# Patient Record
Sex: Female | Born: 1945 | Race: Black or African American | Hispanic: No | Smoking: Never smoker
Health system: Southern US, Community
[De-identification: ages and names within clinical notes are randomized; demographics above are authoritative.]

## PROBLEM LIST (undated history)

## (undated) DIAGNOSIS — R011 Cardiac murmur, unspecified: Secondary | ICD-10-CM

## (undated) DIAGNOSIS — N189 Chronic kidney disease, unspecified: Secondary | ICD-10-CM

## (undated) DIAGNOSIS — R87619 Unspecified abnormal cytological findings in specimens from cervix uteri: Secondary | ICD-10-CM

## (undated) DIAGNOSIS — B019 Varicella without complication: Secondary | ICD-10-CM

## (undated) DIAGNOSIS — U071 COVID-19: Secondary | ICD-10-CM

## (undated) DIAGNOSIS — T7840XA Allergy, unspecified, initial encounter: Secondary | ICD-10-CM

## (undated) DIAGNOSIS — E079 Disorder of thyroid, unspecified: Secondary | ICD-10-CM

## (undated) DIAGNOSIS — D649 Anemia, unspecified: Secondary | ICD-10-CM

## (undated) DIAGNOSIS — B059 Measles without complication: Secondary | ICD-10-CM

## (undated) DIAGNOSIS — N2581 Secondary hyperparathyroidism of renal origin: Secondary | ICD-10-CM

## (undated) DIAGNOSIS — G4730 Sleep apnea, unspecified: Secondary | ICD-10-CM

## (undated) DIAGNOSIS — J439 Emphysema, unspecified: Secondary | ICD-10-CM

## (undated) DIAGNOSIS — E785 Hyperlipidemia, unspecified: Secondary | ICD-10-CM

## (undated) DIAGNOSIS — L603 Nail dystrophy: Secondary | ICD-10-CM

## (undated) DIAGNOSIS — I10 Essential (primary) hypertension: Secondary | ICD-10-CM

## (undated) DIAGNOSIS — N6019 Diffuse cystic mastopathy of unspecified breast: Secondary | ICD-10-CM

## (undated) DIAGNOSIS — H409 Unspecified glaucoma: Secondary | ICD-10-CM

## (undated) DIAGNOSIS — M25519 Pain in unspecified shoulder: Secondary | ICD-10-CM

## (undated) DIAGNOSIS — F0390 Unspecified dementia without behavioral disturbance: Secondary | ICD-10-CM

## (undated) HISTORY — DX: Pain in unspecified shoulder: M25.519

## (undated) HISTORY — DX: Secondary hyperparathyroidism of renal origin: N25.81

## (undated) HISTORY — DX: Measles without complication: B05.9

## (undated) HISTORY — DX: Cardiac murmur, unspecified: R01.1

## (undated) HISTORY — DX: Chronic kidney disease, unspecified: N18.9

## (undated) HISTORY — DX: Nail dystrophy: L60.3

## (undated) HISTORY — PX: UTERINE FIBROID SURGERY: SHX826

## (undated) HISTORY — DX: Unspecified abnormal cytological findings in specimens from cervix uteri: R87.619

## (undated) HISTORY — DX: Disorder of thyroid, unspecified: E07.9

## (undated) HISTORY — DX: Diffuse cystic mastopathy of unspecified breast: N60.19

## (undated) HISTORY — DX: Unspecified dementia, unspecified severity, without behavioral disturbance, psychotic disturbance, mood disturbance, and anxiety: F03.90

## (undated) HISTORY — DX: Sleep apnea, unspecified: G47.30

## (undated) HISTORY — DX: Varicella without complication: B01.9

## (undated) HISTORY — PX: ABDOMINAL HYSTERECTOMY: SHX81

## (undated) HISTORY — DX: Anemia, unspecified: D64.9

## (undated) HISTORY — DX: Emphysema, unspecified: J43.9

## (undated) HISTORY — DX: COVID-19: U07.1

## (undated) HISTORY — DX: Allergy, unspecified, initial encounter: T78.40XA

## (undated) HISTORY — PX: VEIN LIGATION AND STRIPPING: SHX2653

## (undated) HISTORY — DX: Unspecified glaucoma: H40.9

## (undated) HISTORY — DX: Essential (primary) hypertension: I10

## (undated) HISTORY — DX: Hyperlipidemia, unspecified: E78.5

## (undated) HISTORY — PX: VARICOSE VEIN SURGERY: SHX832

---

## 1997-12-08 ENCOUNTER — Encounter: Admission: RE | Admit: 1997-12-08 | Discharge: 1997-12-08 | Payer: Self-pay | Admitting: Internal Medicine

## 1998-01-16 ENCOUNTER — Encounter: Admission: RE | Admit: 1998-01-16 | Discharge: 1998-01-16 | Payer: Self-pay | Admitting: Internal Medicine

## 1998-01-30 ENCOUNTER — Encounter: Admission: RE | Admit: 1998-01-30 | Discharge: 1998-04-30 | Payer: Self-pay | Admitting: *Deleted

## 1998-07-19 ENCOUNTER — Other Ambulatory Visit: Admission: RE | Admit: 1998-07-19 | Discharge: 1998-07-19 | Payer: Self-pay | Admitting: Obstetrics & Gynecology

## 1998-07-23 ENCOUNTER — Emergency Department (HOSPITAL_COMMUNITY): Admission: EM | Admit: 1998-07-23 | Discharge: 1998-07-23 | Payer: Self-pay | Admitting: Emergency Medicine

## 2011-06-24 DIAGNOSIS — E785 Hyperlipidemia, unspecified: Secondary | ICD-10-CM | POA: Diagnosis not present

## 2011-06-24 DIAGNOSIS — I498 Other specified cardiac arrhythmias: Secondary | ICD-10-CM | POA: Diagnosis not present

## 2011-06-24 DIAGNOSIS — E039 Hypothyroidism, unspecified: Secondary | ICD-10-CM | POA: Diagnosis not present

## 2011-06-24 DIAGNOSIS — I10 Essential (primary) hypertension: Secondary | ICD-10-CM | POA: Diagnosis not present

## 2011-06-24 DIAGNOSIS — E041 Nontoxic single thyroid nodule: Secondary | ICD-10-CM | POA: Diagnosis not present

## 2011-08-19 DIAGNOSIS — N19 Unspecified kidney failure: Secondary | ICD-10-CM | POA: Diagnosis not present

## 2011-10-13 DIAGNOSIS — I998 Other disorder of circulatory system: Secondary | ICD-10-CM | POA: Diagnosis not present

## 2011-10-13 DIAGNOSIS — I743 Embolism and thrombosis of arteries of the lower extremities: Secondary | ICD-10-CM | POA: Diagnosis not present

## 2011-10-13 DIAGNOSIS — I75029 Atheroembolism of unspecified lower extremity: Secondary | ICD-10-CM | POA: Diagnosis not present

## 2011-12-11 DIAGNOSIS — Q618 Other cystic kidney diseases: Secondary | ICD-10-CM | POA: Diagnosis not present

## 2011-12-11 DIAGNOSIS — E039 Hypothyroidism, unspecified: Secondary | ICD-10-CM | POA: Diagnosis not present

## 2011-12-11 DIAGNOSIS — I120 Hypertensive chronic kidney disease with stage 5 chronic kidney disease or end stage renal disease: Secondary | ICD-10-CM | POA: Diagnosis not present

## 2011-12-11 DIAGNOSIS — R7309 Other abnormal glucose: Secondary | ICD-10-CM | POA: Diagnosis not present

## 2011-12-12 DIAGNOSIS — R5381 Other malaise: Secondary | ICD-10-CM | POA: Diagnosis not present

## 2011-12-12 DIAGNOSIS — Q612 Polycystic kidney, adult type: Secondary | ICD-10-CM | POA: Diagnosis not present

## 2011-12-12 DIAGNOSIS — R5383 Other fatigue: Secondary | ICD-10-CM | POA: Diagnosis not present

## 2011-12-12 DIAGNOSIS — I4589 Other specified conduction disorders: Secondary | ICD-10-CM | POA: Diagnosis not present

## 2011-12-12 DIAGNOSIS — I10 Essential (primary) hypertension: Secondary | ICD-10-CM | POA: Diagnosis not present

## 2012-01-13 DIAGNOSIS — H4011X0 Primary open-angle glaucoma, stage unspecified: Secondary | ICD-10-CM | POA: Diagnosis not present

## 2012-01-13 DIAGNOSIS — H2510 Age-related nuclear cataract, unspecified eye: Secondary | ICD-10-CM | POA: Diagnosis not present

## 2012-01-14 DIAGNOSIS — K3189 Other diseases of stomach and duodenum: Secondary | ICD-10-CM | POA: Diagnosis not present

## 2012-01-14 DIAGNOSIS — R1013 Epigastric pain: Secondary | ICD-10-CM | POA: Diagnosis not present

## 2012-02-12 DIAGNOSIS — E669 Obesity, unspecified: Secondary | ICD-10-CM | POA: Diagnosis not present

## 2012-02-12 DIAGNOSIS — E785 Hyperlipidemia, unspecified: Secondary | ICD-10-CM | POA: Diagnosis not present

## 2012-02-12 DIAGNOSIS — I10 Essential (primary) hypertension: Secondary | ICD-10-CM | POA: Diagnosis not present

## 2012-02-25 DIAGNOSIS — Z86010 Personal history of colonic polyps: Secondary | ICD-10-CM | POA: Diagnosis not present

## 2012-05-18 DIAGNOSIS — D649 Anemia, unspecified: Secondary | ICD-10-CM | POA: Diagnosis not present

## 2012-05-18 DIAGNOSIS — D510 Vitamin B12 deficiency anemia due to intrinsic factor deficiency: Secondary | ICD-10-CM | POA: Diagnosis not present

## 2012-05-18 DIAGNOSIS — E785 Hyperlipidemia, unspecified: Secondary | ICD-10-CM | POA: Diagnosis not present

## 2012-05-18 DIAGNOSIS — H409 Unspecified glaucoma: Secondary | ICD-10-CM | POA: Diagnosis not present

## 2012-05-18 DIAGNOSIS — I10 Essential (primary) hypertension: Secondary | ICD-10-CM | POA: Diagnosis not present

## 2012-05-18 DIAGNOSIS — E039 Hypothyroidism, unspecified: Secondary | ICD-10-CM | POA: Diagnosis not present

## 2012-05-31 DIAGNOSIS — Z1211 Encounter for screening for malignant neoplasm of colon: Secondary | ICD-10-CM | POA: Diagnosis not present

## 2012-05-31 DIAGNOSIS — E039 Hypothyroidism, unspecified: Secondary | ICD-10-CM | POA: Diagnosis not present

## 2012-05-31 DIAGNOSIS — E785 Hyperlipidemia, unspecified: Secondary | ICD-10-CM | POA: Diagnosis not present

## 2012-05-31 DIAGNOSIS — I10 Essential (primary) hypertension: Secondary | ICD-10-CM | POA: Diagnosis not present

## 2012-06-01 DIAGNOSIS — I10 Essential (primary) hypertension: Secondary | ICD-10-CM | POA: Diagnosis not present

## 2012-07-06 DIAGNOSIS — E785 Hyperlipidemia, unspecified: Secondary | ICD-10-CM | POA: Diagnosis not present

## 2012-07-06 DIAGNOSIS — I10 Essential (primary) hypertension: Secondary | ICD-10-CM | POA: Diagnosis not present

## 2012-07-06 DIAGNOSIS — E039 Hypothyroidism, unspecified: Secondary | ICD-10-CM | POA: Diagnosis not present

## 2012-07-06 DIAGNOSIS — R609 Edema, unspecified: Secondary | ICD-10-CM | POA: Diagnosis not present

## 2012-08-20 DIAGNOSIS — E785 Hyperlipidemia, unspecified: Secondary | ICD-10-CM | POA: Diagnosis not present

## 2012-08-20 DIAGNOSIS — I10 Essential (primary) hypertension: Secondary | ICD-10-CM | POA: Diagnosis not present

## 2012-08-20 DIAGNOSIS — R3989 Other symptoms and signs involving the genitourinary system: Secondary | ICD-10-CM | POA: Diagnosis not present

## 2012-08-20 DIAGNOSIS — D239 Other benign neoplasm of skin, unspecified: Secondary | ICD-10-CM | POA: Diagnosis not present

## 2012-10-05 DIAGNOSIS — L821 Other seborrheic keratosis: Secondary | ICD-10-CM | POA: Diagnosis not present

## 2012-11-02 DIAGNOSIS — K5900 Constipation, unspecified: Secondary | ICD-10-CM | POA: Diagnosis not present

## 2012-11-02 DIAGNOSIS — I10 Essential (primary) hypertension: Secondary | ICD-10-CM | POA: Diagnosis not present

## 2012-11-02 DIAGNOSIS — E785 Hyperlipidemia, unspecified: Secondary | ICD-10-CM | POA: Diagnosis not present

## 2012-11-02 DIAGNOSIS — E039 Hypothyroidism, unspecified: Secondary | ICD-10-CM | POA: Diagnosis not present

## 2012-11-24 DIAGNOSIS — N281 Cyst of kidney, acquired: Secondary | ICD-10-CM | POA: Diagnosis not present

## 2012-11-24 DIAGNOSIS — N181 Chronic kidney disease, stage 1: Secondary | ICD-10-CM | POA: Diagnosis not present

## 2012-12-06 DIAGNOSIS — H35039 Hypertensive retinopathy, unspecified eye: Secondary | ICD-10-CM | POA: Diagnosis not present

## 2012-12-06 DIAGNOSIS — H4011X0 Primary open-angle glaucoma, stage unspecified: Secondary | ICD-10-CM | POA: Diagnosis not present

## 2012-12-22 DIAGNOSIS — Q618 Other cystic kidney diseases: Secondary | ICD-10-CM | POA: Diagnosis not present

## 2012-12-22 DIAGNOSIS — N281 Cyst of kidney, acquired: Secondary | ICD-10-CM | POA: Diagnosis not present

## 2012-12-22 DIAGNOSIS — N289 Disorder of kidney and ureter, unspecified: Secondary | ICD-10-CM | POA: Diagnosis not present

## 2012-12-22 DIAGNOSIS — M549 Dorsalgia, unspecified: Secondary | ICD-10-CM | POA: Diagnosis not present

## 2012-12-22 DIAGNOSIS — N189 Chronic kidney disease, unspecified: Secondary | ICD-10-CM | POA: Diagnosis not present

## 2012-12-22 DIAGNOSIS — R109 Unspecified abdominal pain: Secondary | ICD-10-CM | POA: Diagnosis not present

## 2012-12-31 DIAGNOSIS — I129 Hypertensive chronic kidney disease with stage 1 through stage 4 chronic kidney disease, or unspecified chronic kidney disease: Secondary | ICD-10-CM | POA: Diagnosis not present

## 2012-12-31 DIAGNOSIS — D128 Benign neoplasm of rectum: Secondary | ICD-10-CM | POA: Diagnosis not present

## 2012-12-31 DIAGNOSIS — Z86010 Personal history of colonic polyps: Secondary | ICD-10-CM | POA: Diagnosis not present

## 2012-12-31 DIAGNOSIS — K5730 Diverticulosis of large intestine without perforation or abscess without bleeding: Secondary | ICD-10-CM | POA: Diagnosis not present

## 2012-12-31 DIAGNOSIS — N189 Chronic kidney disease, unspecified: Secondary | ICD-10-CM | POA: Diagnosis not present

## 2012-12-31 DIAGNOSIS — Z79899 Other long term (current) drug therapy: Secondary | ICD-10-CM | POA: Diagnosis not present

## 2012-12-31 DIAGNOSIS — E039 Hypothyroidism, unspecified: Secondary | ICD-10-CM | POA: Diagnosis not present

## 2012-12-31 DIAGNOSIS — K621 Rectal polyp: Secondary | ICD-10-CM | POA: Diagnosis not present

## 2012-12-31 DIAGNOSIS — K620 Anal polyp: Secondary | ICD-10-CM | POA: Diagnosis not present

## 2012-12-31 DIAGNOSIS — Z1211 Encounter for screening for malignant neoplasm of colon: Secondary | ICD-10-CM | POA: Diagnosis not present

## 2012-12-31 DIAGNOSIS — D126 Benign neoplasm of colon, unspecified: Secondary | ICD-10-CM | POA: Diagnosis not present

## 2012-12-31 HISTORY — PX: KIDNEY CYST REMOVAL: SHX684

## 2013-01-03 DIAGNOSIS — Z531 Procedure and treatment not carried out because of patient's decision for reasons of belief and group pressure: Secondary | ICD-10-CM | POA: Diagnosis not present

## 2013-01-03 DIAGNOSIS — N281 Cyst of kidney, acquired: Secondary | ICD-10-CM | POA: Diagnosis not present

## 2013-01-03 DIAGNOSIS — N189 Chronic kidney disease, unspecified: Secondary | ICD-10-CM | POA: Diagnosis not present

## 2013-01-03 DIAGNOSIS — N289 Disorder of kidney and ureter, unspecified: Secondary | ICD-10-CM | POA: Diagnosis not present

## 2013-01-05 DIAGNOSIS — H2510 Age-related nuclear cataract, unspecified eye: Secondary | ICD-10-CM | POA: Diagnosis not present

## 2013-01-05 DIAGNOSIS — H4011X0 Primary open-angle glaucoma, stage unspecified: Secondary | ICD-10-CM | POA: Diagnosis not present

## 2013-01-30 DIAGNOSIS — H1130 Conjunctival hemorrhage, unspecified eye: Secondary | ICD-10-CM | POA: Diagnosis not present

## 2013-02-02 DIAGNOSIS — H1130 Conjunctival hemorrhage, unspecified eye: Secondary | ICD-10-CM | POA: Diagnosis not present

## 2013-02-07 DIAGNOSIS — N281 Cyst of kidney, acquired: Secondary | ICD-10-CM | POA: Diagnosis not present

## 2013-02-07 DIAGNOSIS — N289 Disorder of kidney and ureter, unspecified: Secondary | ICD-10-CM | POA: Diagnosis not present

## 2013-02-07 DIAGNOSIS — I129 Hypertensive chronic kidney disease with stage 1 through stage 4 chronic kidney disease, or unspecified chronic kidney disease: Secondary | ICD-10-CM | POA: Diagnosis not present

## 2013-02-07 DIAGNOSIS — N189 Chronic kidney disease, unspecified: Secondary | ICD-10-CM | POA: Diagnosis not present

## 2013-02-07 DIAGNOSIS — Z01810 Encounter for preprocedural cardiovascular examination: Secondary | ICD-10-CM | POA: Diagnosis not present

## 2013-02-15 DIAGNOSIS — N281 Cyst of kidney, acquired: Secondary | ICD-10-CM | POA: Diagnosis present

## 2013-02-15 DIAGNOSIS — I517 Cardiomegaly: Secondary | ICD-10-CM | POA: Diagnosis not present

## 2013-02-15 DIAGNOSIS — G8918 Other acute postprocedural pain: Secondary | ICD-10-CM | POA: Diagnosis not present

## 2013-02-15 DIAGNOSIS — K5900 Constipation, unspecified: Secondary | ICD-10-CM | POA: Diagnosis present

## 2013-02-15 DIAGNOSIS — D3000 Benign neoplasm of unspecified kidney: Secondary | ICD-10-CM | POA: Diagnosis not present

## 2013-02-15 DIAGNOSIS — E039 Hypothyroidism, unspecified: Secondary | ICD-10-CM | POA: Diagnosis present

## 2013-02-15 DIAGNOSIS — I129 Hypertensive chronic kidney disease with stage 1 through stage 4 chronic kidney disease, or unspecified chronic kidney disease: Secondary | ICD-10-CM | POA: Diagnosis not present

## 2013-02-15 DIAGNOSIS — N189 Chronic kidney disease, unspecified: Secondary | ICD-10-CM | POA: Diagnosis not present

## 2013-02-15 DIAGNOSIS — R109 Unspecified abdominal pain: Secondary | ICD-10-CM | POA: Diagnosis not present

## 2013-02-15 DIAGNOSIS — N289 Disorder of kidney and ureter, unspecified: Secondary | ICD-10-CM | POA: Diagnosis not present

## 2013-02-15 DIAGNOSIS — E785 Hyperlipidemia, unspecified: Secondary | ICD-10-CM | POA: Diagnosis present

## 2013-02-15 DIAGNOSIS — Q618 Other cystic kidney diseases: Secondary | ICD-10-CM | POA: Diagnosis not present

## 2013-02-15 DIAGNOSIS — J90 Pleural effusion, not elsewhere classified: Secondary | ICD-10-CM | POA: Diagnosis not present

## 2013-02-15 DIAGNOSIS — R918 Other nonspecific abnormal finding of lung field: Secondary | ICD-10-CM | POA: Diagnosis not present

## 2013-02-15 DIAGNOSIS — D573 Sickle-cell trait: Secondary | ICD-10-CM | POA: Diagnosis present

## 2013-02-23 DIAGNOSIS — Z5189 Encounter for other specified aftercare: Secondary | ICD-10-CM | POA: Diagnosis not present

## 2013-03-08 DIAGNOSIS — C649 Malignant neoplasm of unspecified kidney, except renal pelvis: Secondary | ICD-10-CM | POA: Diagnosis not present

## 2013-06-12 DIAGNOSIS — I10 Essential (primary) hypertension: Secondary | ICD-10-CM | POA: Diagnosis not present

## 2013-07-14 DIAGNOSIS — N189 Chronic kidney disease, unspecified: Secondary | ICD-10-CM | POA: Diagnosis not present

## 2013-07-14 DIAGNOSIS — I10 Essential (primary) hypertension: Secondary | ICD-10-CM | POA: Diagnosis not present

## 2013-07-14 DIAGNOSIS — E039 Hypothyroidism, unspecified: Secondary | ICD-10-CM | POA: Diagnosis not present

## 2013-07-22 DIAGNOSIS — N179 Acute kidney failure, unspecified: Secondary | ICD-10-CM | POA: Diagnosis not present

## 2013-07-22 DIAGNOSIS — E039 Hypothyroidism, unspecified: Secondary | ICD-10-CM | POA: Diagnosis not present

## 2013-07-22 DIAGNOSIS — I10 Essential (primary) hypertension: Secondary | ICD-10-CM | POA: Diagnosis not present

## 2013-07-22 DIAGNOSIS — Z79899 Other long term (current) drug therapy: Secondary | ICD-10-CM | POA: Diagnosis not present

## 2013-07-25 DIAGNOSIS — N184 Chronic kidney disease, stage 4 (severe): Secondary | ICD-10-CM | POA: Diagnosis not present

## 2013-07-25 DIAGNOSIS — N2581 Secondary hyperparathyroidism of renal origin: Secondary | ICD-10-CM | POA: Diagnosis not present

## 2013-07-25 DIAGNOSIS — N170 Acute kidney failure with tubular necrosis: Secondary | ICD-10-CM | POA: Diagnosis not present

## 2013-07-25 DIAGNOSIS — I10 Essential (primary) hypertension: Secondary | ICD-10-CM | POA: Diagnosis not present

## 2013-08-09 DIAGNOSIS — H4011X0 Primary open-angle glaucoma, stage unspecified: Secondary | ICD-10-CM | POA: Diagnosis not present

## 2013-08-18 DIAGNOSIS — N184 Chronic kidney disease, stage 4 (severe): Secondary | ICD-10-CM | POA: Diagnosis not present

## 2013-08-23 DIAGNOSIS — N039 Chronic nephritic syndrome with unspecified morphologic changes: Secondary | ICD-10-CM | POA: Diagnosis not present

## 2013-08-23 DIAGNOSIS — N184 Chronic kidney disease, stage 4 (severe): Secondary | ICD-10-CM | POA: Diagnosis not present

## 2013-08-23 DIAGNOSIS — N2581 Secondary hyperparathyroidism of renal origin: Secondary | ICD-10-CM | POA: Diagnosis not present

## 2013-08-23 DIAGNOSIS — I10 Essential (primary) hypertension: Secondary | ICD-10-CM | POA: Diagnosis not present

## 2013-08-23 DIAGNOSIS — D631 Anemia in chronic kidney disease: Secondary | ICD-10-CM | POA: Diagnosis not present

## 2013-08-24 DIAGNOSIS — C649 Malignant neoplasm of unspecified kidney, except renal pelvis: Secondary | ICD-10-CM | POA: Diagnosis not present

## 2013-09-06 DIAGNOSIS — N269 Renal sclerosis, unspecified: Secondary | ICD-10-CM | POA: Diagnosis not present

## 2013-09-06 DIAGNOSIS — R935 Abnormal findings on diagnostic imaging of other abdominal regions, including retroperitoneum: Secondary | ICD-10-CM | POA: Diagnosis not present

## 2013-09-06 DIAGNOSIS — C649 Malignant neoplasm of unspecified kidney, except renal pelvis: Secondary | ICD-10-CM | POA: Diagnosis not present

## 2013-09-06 DIAGNOSIS — D3000 Benign neoplasm of unspecified kidney: Secondary | ICD-10-CM | POA: Diagnosis not present

## 2013-09-06 DIAGNOSIS — Z905 Acquired absence of kidney: Secondary | ICD-10-CM | POA: Diagnosis not present

## 2013-09-06 DIAGNOSIS — R944 Abnormal results of kidney function studies: Secondary | ICD-10-CM | POA: Diagnosis not present

## 2013-09-06 DIAGNOSIS — N289 Disorder of kidney and ureter, unspecified: Secondary | ICD-10-CM | POA: Diagnosis not present

## 2013-09-13 DIAGNOSIS — N2581 Secondary hyperparathyroidism of renal origin: Secondary | ICD-10-CM | POA: Diagnosis not present

## 2013-09-13 DIAGNOSIS — R809 Proteinuria, unspecified: Secondary | ICD-10-CM | POA: Diagnosis not present

## 2013-09-13 DIAGNOSIS — I10 Essential (primary) hypertension: Secondary | ICD-10-CM | POA: Diagnosis not present

## 2013-09-13 DIAGNOSIS — D631 Anemia in chronic kidney disease: Secondary | ICD-10-CM | POA: Diagnosis not present

## 2013-09-13 DIAGNOSIS — N184 Chronic kidney disease, stage 4 (severe): Secondary | ICD-10-CM | POA: Diagnosis not present

## 2013-10-05 DIAGNOSIS — E559 Vitamin D deficiency, unspecified: Secondary | ICD-10-CM | POA: Diagnosis not present

## 2013-10-05 DIAGNOSIS — Q613 Polycystic kidney, unspecified: Secondary | ICD-10-CM | POA: Diagnosis not present

## 2013-10-05 DIAGNOSIS — E039 Hypothyroidism, unspecified: Secondary | ICD-10-CM | POA: Diagnosis not present

## 2013-10-05 DIAGNOSIS — R7309 Other abnormal glucose: Secondary | ICD-10-CM | POA: Diagnosis not present

## 2013-10-05 DIAGNOSIS — I10 Essential (primary) hypertension: Secondary | ICD-10-CM | POA: Diagnosis not present

## 2013-10-19 DIAGNOSIS — I129 Hypertensive chronic kidney disease with stage 1 through stage 4 chronic kidney disease, or unspecified chronic kidney disease: Secondary | ICD-10-CM | POA: Diagnosis not present

## 2013-10-19 DIAGNOSIS — Z23 Encounter for immunization: Secondary | ICD-10-CM | POA: Diagnosis not present

## 2013-10-19 DIAGNOSIS — Z79899 Other long term (current) drug therapy: Secondary | ICD-10-CM | POA: Diagnosis not present

## 2013-11-10 DIAGNOSIS — H4011X0 Primary open-angle glaucoma, stage unspecified: Secondary | ICD-10-CM | POA: Diagnosis not present

## 2013-11-14 DIAGNOSIS — Z1231 Encounter for screening mammogram for malignant neoplasm of breast: Secondary | ICD-10-CM | POA: Diagnosis not present

## 2013-11-25 DIAGNOSIS — I129 Hypertensive chronic kidney disease with stage 1 through stage 4 chronic kidney disease, or unspecified chronic kidney disease: Secondary | ICD-10-CM | POA: Diagnosis not present

## 2013-11-25 DIAGNOSIS — E785 Hyperlipidemia, unspecified: Secondary | ICD-10-CM | POA: Diagnosis not present

## 2013-11-25 DIAGNOSIS — N184 Chronic kidney disease, stage 4 (severe): Secondary | ICD-10-CM | POA: Diagnosis not present

## 2013-11-25 DIAGNOSIS — Q619 Cystic kidney disease, unspecified: Secondary | ICD-10-CM | POA: Diagnosis not present

## 2013-11-25 DIAGNOSIS — I10 Essential (primary) hypertension: Secondary | ICD-10-CM | POA: Diagnosis not present

## 2014-02-17 DIAGNOSIS — R109 Unspecified abdominal pain: Secondary | ICD-10-CM | POA: Diagnosis not present

## 2014-02-24 DIAGNOSIS — N390 Urinary tract infection, site not specified: Secondary | ICD-10-CM | POA: Diagnosis not present

## 2014-03-26 ENCOUNTER — Emergency Department: Payer: Self-pay | Admitting: Emergency Medicine

## 2014-03-26 DIAGNOSIS — N189 Chronic kidney disease, unspecified: Secondary | ICD-10-CM | POA: Diagnosis not present

## 2014-03-26 DIAGNOSIS — M791 Myalgia: Secondary | ICD-10-CM | POA: Diagnosis not present

## 2014-03-26 DIAGNOSIS — R202 Paresthesia of skin: Secondary | ICD-10-CM | POA: Diagnosis not present

## 2014-03-26 DIAGNOSIS — I639 Cerebral infarction, unspecified: Secondary | ICD-10-CM | POA: Diagnosis not present

## 2014-03-26 DIAGNOSIS — R252 Cramp and spasm: Secondary | ICD-10-CM | POA: Diagnosis not present

## 2014-03-26 DIAGNOSIS — I129 Hypertensive chronic kidney disease with stage 1 through stage 4 chronic kidney disease, or unspecified chronic kidney disease: Secondary | ICD-10-CM | POA: Diagnosis not present

## 2014-03-26 DIAGNOSIS — F419 Anxiety disorder, unspecified: Secondary | ICD-10-CM | POA: Diagnosis not present

## 2014-03-26 DIAGNOSIS — R209 Unspecified disturbances of skin sensation: Secondary | ICD-10-CM | POA: Diagnosis not present

## 2014-03-26 LAB — COMPREHENSIVE METABOLIC PANEL
ALBUMIN: 3.5 g/dL (ref 3.4–5.0)
ALT: 22 U/L
Alkaline Phosphatase: 72 U/L
Anion Gap: 9 (ref 7–16)
BUN: 50 mg/dL — AB (ref 7–18)
Bilirubin,Total: 0.2 mg/dL (ref 0.2–1.0)
CALCIUM: 8.4 mg/dL — AB (ref 8.5–10.1)
CHLORIDE: 112 mmol/L — AB (ref 98–107)
CREATININE: 3.3 mg/dL — AB (ref 0.60–1.30)
Co2: 23 mmol/L (ref 21–32)
EGFR (African American): 18 — ABNORMAL LOW
EGFR (Non-African Amer.): 15 — ABNORMAL LOW
GLUCOSE: 124 mg/dL — AB (ref 65–99)
Osmolality: 302 (ref 275–301)
Potassium: 3.8 mmol/L (ref 3.5–5.1)
SGOT(AST): 25 U/L (ref 15–37)
Sodium: 144 mmol/L (ref 136–145)
Total Protein: 7.9 g/dL (ref 6.4–8.2)

## 2014-03-26 LAB — CBC
HCT: 36.4 % (ref 35.0–47.0)
HGB: 11.8 g/dL — ABNORMAL LOW (ref 12.0–16.0)
MCH: 29 pg (ref 26.0–34.0)
MCHC: 32.5 g/dL (ref 32.0–36.0)
MCV: 89 fL (ref 80–100)
Platelet: 186 10*3/uL (ref 150–440)
RBC: 4.08 10*6/uL (ref 3.80–5.20)
RDW: 14.8 % — AB (ref 11.5–14.5)
WBC: 5.5 10*3/uL (ref 3.6–11.0)

## 2014-03-26 LAB — TROPONIN I: Troponin-I: <0.02 ng/mL

## 2014-03-28 DIAGNOSIS — R7309 Other abnormal glucose: Secondary | ICD-10-CM | POA: Diagnosis not present

## 2014-03-28 DIAGNOSIS — I10 Essential (primary) hypertension: Secondary | ICD-10-CM | POA: Diagnosis not present

## 2014-03-28 DIAGNOSIS — E784 Other hyperlipidemia: Secondary | ICD-10-CM | POA: Diagnosis not present

## 2014-03-28 DIAGNOSIS — E039 Hypothyroidism, unspecified: Secondary | ICD-10-CM | POA: Diagnosis not present

## 2014-03-28 DIAGNOSIS — Q613 Polycystic kidney, unspecified: Secondary | ICD-10-CM | POA: Diagnosis not present

## 2014-03-28 DIAGNOSIS — E559 Vitamin D deficiency, unspecified: Secondary | ICD-10-CM | POA: Diagnosis not present

## 2014-05-14 DIAGNOSIS — J069 Acute upper respiratory infection, unspecified: Secondary | ICD-10-CM | POA: Diagnosis not present

## 2014-05-14 DIAGNOSIS — J9801 Acute bronchospasm: Secondary | ICD-10-CM | POA: Diagnosis not present

## 2014-05-17 DIAGNOSIS — M79604 Pain in right leg: Secondary | ICD-10-CM | POA: Diagnosis not present

## 2014-05-17 DIAGNOSIS — M79605 Pain in left leg: Secondary | ICD-10-CM | POA: Diagnosis not present

## 2014-05-17 DIAGNOSIS — M79606 Pain in leg, unspecified: Secondary | ICD-10-CM | POA: Diagnosis not present

## 2014-06-20 DIAGNOSIS — E784 Other hyperlipidemia: Secondary | ICD-10-CM | POA: Diagnosis not present

## 2014-07-09 DIAGNOSIS — G4733 Obstructive sleep apnea (adult) (pediatric): Secondary | ICD-10-CM | POA: Diagnosis not present

## 2014-07-12 DIAGNOSIS — I10 Essential (primary) hypertension: Secondary | ICD-10-CM | POA: Diagnosis not present

## 2014-07-12 DIAGNOSIS — N184 Chronic kidney disease, stage 4 (severe): Secondary | ICD-10-CM | POA: Diagnosis not present

## 2014-07-14 DIAGNOSIS — N184 Chronic kidney disease, stage 4 (severe): Secondary | ICD-10-CM | POA: Diagnosis not present

## 2014-07-24 DIAGNOSIS — H4011X2 Primary open-angle glaucoma, moderate stage: Secondary | ICD-10-CM | POA: Diagnosis not present

## 2014-07-24 DIAGNOSIS — H16223 Keratoconjunctivitis sicca, not specified as Sjogren's, bilateral: Secondary | ICD-10-CM | POA: Diagnosis not present

## 2014-10-09 DIAGNOSIS — N184 Chronic kidney disease, stage 4 (severe): Secondary | ICD-10-CM | POA: Diagnosis not present

## 2014-10-09 DIAGNOSIS — I10 Essential (primary) hypertension: Secondary | ICD-10-CM | POA: Diagnosis not present

## 2014-10-25 DIAGNOSIS — E038 Other specified hypothyroidism: Secondary | ICD-10-CM | POA: Diagnosis not present

## 2014-10-25 DIAGNOSIS — E784 Other hyperlipidemia: Secondary | ICD-10-CM | POA: Diagnosis not present

## 2014-10-25 DIAGNOSIS — I129 Hypertensive chronic kidney disease with stage 1 through stage 4 chronic kidney disease, or unspecified chronic kidney disease: Secondary | ICD-10-CM | POA: Diagnosis not present

## 2015-02-02 DIAGNOSIS — H16223 Keratoconjunctivitis sicca, not specified as Sjogren's, bilateral: Secondary | ICD-10-CM | POA: Diagnosis not present

## 2015-02-02 DIAGNOSIS — H1131 Conjunctival hemorrhage, right eye: Secondary | ICD-10-CM | POA: Diagnosis not present

## 2015-02-02 DIAGNOSIS — H4011X2 Primary open-angle glaucoma, moderate stage: Secondary | ICD-10-CM | POA: Diagnosis not present

## 2015-02-28 DIAGNOSIS — I10 Essential (primary) hypertension: Secondary | ICD-10-CM | POA: Diagnosis not present

## 2015-02-28 DIAGNOSIS — N184 Chronic kidney disease, stage 4 (severe): Secondary | ICD-10-CM | POA: Diagnosis not present

## 2015-03-12 DIAGNOSIS — H35039 Hypertensive retinopathy, unspecified eye: Secondary | ICD-10-CM | POA: Diagnosis not present

## 2015-03-12 DIAGNOSIS — H1131 Conjunctival hemorrhage, right eye: Secondary | ICD-10-CM | POA: Diagnosis not present

## 2015-03-14 ENCOUNTER — Ambulatory Visit (INDEPENDENT_AMBULATORY_CARE_PROVIDER_SITE_OTHER): Payer: Medicare Other | Admitting: Family Medicine

## 2015-03-14 ENCOUNTER — Encounter: Payer: Self-pay | Admitting: Family Medicine

## 2015-03-14 VITALS — BP 154/76 | HR 55 | Temp 97.8°F | Resp 16 | Ht 62.0 in | Wt 200.2 lb

## 2015-03-14 DIAGNOSIS — N183 Chronic kidney disease, stage 3 unspecified: Secondary | ICD-10-CM

## 2015-03-14 DIAGNOSIS — Q613 Polycystic kidney, unspecified: Secondary | ICD-10-CM | POA: Diagnosis not present

## 2015-03-14 DIAGNOSIS — R7301 Impaired fasting glucose: Secondary | ICD-10-CM | POA: Diagnosis not present

## 2015-03-14 DIAGNOSIS — E038 Other specified hypothyroidism: Secondary | ICD-10-CM

## 2015-03-14 DIAGNOSIS — J309 Allergic rhinitis, unspecified: Secondary | ICD-10-CM

## 2015-03-14 DIAGNOSIS — Z2821 Immunization not carried out because of patient refusal: Secondary | ICD-10-CM | POA: Diagnosis not present

## 2015-03-14 DIAGNOSIS — I10 Essential (primary) hypertension: Secondary | ICD-10-CM

## 2015-03-14 DIAGNOSIS — E785 Hyperlipidemia, unspecified: Secondary | ICD-10-CM | POA: Diagnosis not present

## 2015-03-14 DIAGNOSIS — J45909 Unspecified asthma, uncomplicated: Secondary | ICD-10-CM

## 2015-03-14 DIAGNOSIS — H409 Unspecified glaucoma: Secondary | ICD-10-CM | POA: Diagnosis not present

## 2015-03-14 DIAGNOSIS — I129 Hypertensive chronic kidney disease with stage 1 through stage 4 chronic kidney disease, or unspecified chronic kidney disease: Secondary | ICD-10-CM | POA: Diagnosis not present

## 2015-03-14 DIAGNOSIS — R7303 Prediabetes: Secondary | ICD-10-CM | POA: Insufficient documentation

## 2015-03-14 DIAGNOSIS — R0982 Postnasal drip: Secondary | ICD-10-CM

## 2015-03-14 DIAGNOSIS — E039 Hypothyroidism, unspecified: Secondary | ICD-10-CM | POA: Insufficient documentation

## 2015-03-14 DIAGNOSIS — I120 Hypertensive chronic kidney disease with stage 5 chronic kidney disease or end stage renal disease: Secondary | ICD-10-CM | POA: Insufficient documentation

## 2015-03-14 DIAGNOSIS — N185 Chronic kidney disease, stage 5: Secondary | ICD-10-CM

## 2015-03-14 DIAGNOSIS — R300 Dysuria: Secondary | ICD-10-CM | POA: Insufficient documentation

## 2015-03-14 LAB — POCT URINALYSIS DIPSTICK
Bilirubin, UA: NEGATIVE
Glucose, UA: NEGATIVE
Ketones, UA: NEGATIVE
Leukocytes, UA: NEGATIVE
NITRITE UA: NEGATIVE
PH UA: 5
PROTEIN UA: NEGATIVE
SPEC GRAV UA: 1.015
UROBILINOGEN UA: 0.2

## 2015-03-14 MED ORDER — HYDRALAZINE HCL 10 MG PO TABS: 10.0000 mg | ORAL_TABLET | Freq: Three times a day (TID) | ORAL | Status: DC

## 2015-03-14 MED ORDER — FLUTICASONE PROPIONATE 50 MCG/ACT NA SUSP: 2.0000 | Freq: Every day | NASAL | Status: DC

## 2015-03-14 NOTE — Progress Notes (Signed)
Name: Holly Mcgee   MRN: [id number]    DOB: 25-Dec-1945   Date:03/14/2015       Progress Note  Subjective  Chief Complaint  Chief Complaint  Patient presents with  . Establish Care  . Hypertension    Headaches, Edema-taking Lasix to help, takes BP at home-174/74  . Ear Pain    Bilateral ear pain, onset-2 weeks getting worst painful.  . Dysuria    1 month and intermittently  . Hypothyroidism    Used to see a Musician in [place].    HPI  Holly Mcgee is a 69 y.o. female here today to transition care of medical needs to a primary care provider. She complainso f bilateral ear pain for 2 weeks now. Not associated with fevers. Patient complains of dysuria and frequency She has had symptoms for several weeks. Patient also complains of congestion and rhinitis. Patient denies sorethroat, stomach ache and vaginal discharge. Patient does not have a history of recurrent UTI.  Patient does not have a history of pyelonephritis. Patient here for elevated blood pressure. She is not exercising and is not adherent to low salt diet.  Blood pressure is not well controlled at home. Cardiac symptoms lower extremity edema. Patient denies chest pain, chest pressure/discomfort, dyspnea, irregular heart beat and near-syncope.  Cardiovascular risk factors: advanced age (older than 40 for men, 70 for women), dyslipidemia, hypertension, obesity (BMI >= 30 kg/m2) and sedentary lifestyle. Use of agents associated with hypertension: NSAIDS and thyroid hormones. History of target organ damage: none. Related conditions include malignant HTN with CKD III, followed by Dr. Erling Cruz, Nephrologist in [place]. Patient reports more LE swelling and fatigue. Family risk for DM II.     Past Medical History  Diagnosis Date  . Hyperlipidemia   . Hypertension   . Allergy   . Thyroid disease     Patient Active Problem List   Diagnosis Date Noted  . Hypertension goal BP (blood pressure) < 140/90 03/14/2015   . Allergic rhinitis with postnasal drip 03/14/2015  . Dysuria 03/14/2015  . Dysuria 03/14/2015  . Glaucoma 03/14/2015    Social History  Substance Use Topics  . Smoking status: Never Smoker   . Smokeless tobacco: Never Used  . Alcohol Use: No     Current outpatient prescriptions:  .  atenolol (TENORMIN) 100 MG tablet, TK 1 T PO D, Disp: , Rfl: 6 .  Cholecalciferol (VITAMIN D-1000 MAX ST) 1000 UNITS tablet, Take by mouth., Disp: , Rfl:  .  cycloSPORINE (RESTASIS) 0.05 % ophthalmic emulsion, Place 1 drop into both eyes 2 (two) times daily., Disp: , Rfl:  .  dorzolamide-timolol (COSOPT) 22.3-6.8 MG/ML ophthalmic solution, Apply to eye., Disp: , Rfl:  .  furosemide (LASIX) 40 MG tablet, TK 1 T PO D, Disp: , Rfl: 6 .  TIROSINT 50 MCG CAPS, TK 1 C PO ONCE A DAY, Disp: , Rfl: 5 .  Travoprost, BAK Free, (TRAVATAN Z) 0.004 % SOLN ophthalmic solution, Apply to eye., Disp: , Rfl:    Past Surgical History  Procedure Laterality Date  . Kidney cyst removal  12/2012    Performed at [facility]  . Uterine fibroid surgery      In [place] several years ago  . Vein ligation and stripping Left     Performed in [place]  . Abdominal hysterectomy       Family History  Problem Relation Age of Onset  . Hyperlipidemia Mother   . Hypertension Mother   .  Diabetes Mother   . Hyperlipidemia Father   . Hypertension Father   . Cancer Father     colon  . Stroke Father     complications from HTN  . Mental illness Daughter   . Diabetes Son     hereditary     Allergies  Allergen Reactions  . Citrus Itching and Swelling     Review of Systems  CONSTITUTIONAL: No significant weight changes, fever, chills, weakness or fatigue.  HEENT:  - Eyes: No visual changes.  - Ears: No auditory changes. No pain.  - Nose: No sneezing, congestion, runny nose. - Throat: No sore throat. No changes in swallowing. SKIN: No rash or itching.  CARDIOVASCULAR: No chest pain, chest pressure or chest discomfort. No  palpitations or edema.  RESPIRATORY: No shortness of breath, cough or sputum.  GASTROINTESTINAL: No anorexia, nausea, vomiting. No changes in bowel habits. No abdominal pain or blood.  GENITOURINARY: Yes dysuria. No frequency. No discharge.  NEUROLOGICAL: No headache, dizziness, syncope, paralysis, ataxia, numbness or tingling in the extremities. No memory changes. No change in bowel or bladder control.  MUSCULOSKELETAL: No joint pain. No muscle pain. HEMATOLOGIC: No anemia, bleeding or bruising.  LYMPHATICS: No enlarged lymph nodes.  PSYCHIATRIC: No change in mood. No change in sleep pattern.  ENDOCRINOLOGIC: No reports of sweating, cold or heat intolerance. No polyuria or polydipsia.     Objective  BP 154/76 mmHg  Pulse 55  Temp(Src) 97.8 F (36.6 C) (Oral)  Resp 16  Ht 5\' 2"  (1.575 m)  Wt 200 lb 3.2 oz (90.81 kg)  BMI 36.61 kg/m2  SpO2 94% Body mass index is 36.61 kg/(m^2).  Physical Exam  Constitutional: Patient is obese and well-nourished. In no distress.  HEENT:  - Head: Normocephalic and atraumatic.  - Ears: Bilateral TMs gray, no erythema or effusion - Nose: Nasal mucosa moist, boggy - Mouth/Throat: Oropharynx is clear and moist. No tonsillar hypertrophy or erythema. Yes post nasal drainage.  - Eyes: Conjunctivae clear, EOM movements normal. PERRLA. No scleral icterus.  Neck: Normal range of motion. Neck supple. No JVD present. No thyromegaly present.  Cardiovascular: Normal rate, regular rhythm and normal heart sounds.  No murmur heard.  Pulmonary/Chest: Effort normal and breath sounds normal. No respiratory distress. Musculoskeletal: Normal range of motion bilateral UE and LE, no joint effusions. Peripheral vascular: Bilateral LE 1+ edema up to lower shins. Neurological: CN II-XII grossly intact with no focal deficits. Alert and oriented to person, place, and time. Coordination, balance, strength, speech and gait are normal.  Skin: Skin is warm and dry. No rash  noted. No erythema.  Psychiatric: Patient has a normal mood and affect. Behavior is normal in office today. Judgment and thought content normal in office today.   Recent Results (from the past 2160 hour(s))  POCT Urinalysis Dipstick     Status: None   Collection Time: 03/14/15  1:46 PM  Result Value Ref Range   Color, UA light yellow    Clarity, UA clear    Glucose, UA neg    Bilirubin, UA neg    Ketones, UA neg    Spec Grav, UA 1.015    Blood, UA Moderate    pH, UA 5.0    Protein, UA neg    Urobilinogen, UA 0.2    Nitrite, UA neg    Leukocytes, UA Negative Negative     Assessment & Plan  1. Hypertension goal BP (blood pressure) < 140/90 Sub optimal control. Was on ACEi at  some point but now with advancing CKD she can not use ACEi/ARB. Already on Atenolol 100 mg a day and Lasix 40 mg a day. Will add on hydralazine 10mg  po bid. Consider amlodipine use if needed but may worsen LE edema.  - CBC with Differential/Platelet - Comprehensive metabolic panel - hydrALAZINE (APRESOLINE) 10 MG tablet; Take 1 tablet (10 mg total) by mouth 3 (three) times daily.  Dispense: 90 tablet; Refill: 2  2. 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 May need dialysis at some point but she reports still making urine. Reviewed lab work from Nephrologist from 02/2015.   - CBC with Differential/Platelet - hydrALAZINE (APRESOLINE) 10 MG tablet; Take 1 tablet (10 mg total) by mouth 3 (three) times daily.  Dispense: 90 tablet; Refill: 2  3. Dysuria Udip shows hematuria without frank infection. Will culture urine.   - POCT Urinalysis Dipstick - Urine Culture - CBC with Differential/Platelet - Comprehensive metabolic panel  4. Allergic rhinitis with postnasal drip Start flonase.  - fluticasone (FLONASE) 50 MCG/ACT nasal spray; Place 2 sprays into both nostrils daily.  Dispense: 16 g; Refill: 3  5. Glaucoma Continue follow up with specialist.  6.  Polycystic kidney disease Continue follow up with specialist.  7. Adult onset hypothyroidism Will get blood work.  - TSH - T3, free - T4, free  8. Hyperlipidemia LDL goal <100 Blood work. She is not on a statin.   - Lipid panel  9. Elevated fasting glucose Rule out DM II diagnosis.  - Hemoglobin A1c  10. Influenza vaccination declined by patient

## 2015-03-15 DIAGNOSIS — E785 Hyperlipidemia, unspecified: Secondary | ICD-10-CM | POA: Diagnosis not present

## 2015-03-15 DIAGNOSIS — R7301 Impaired fasting glucose: Secondary | ICD-10-CM | POA: Diagnosis not present

## 2015-03-15 DIAGNOSIS — I129 Hypertensive chronic kidney disease with stage 1 through stage 4 chronic kidney disease, or unspecified chronic kidney disease: Secondary | ICD-10-CM | POA: Diagnosis not present

## 2015-03-15 DIAGNOSIS — E038 Other specified hypothyroidism: Secondary | ICD-10-CM | POA: Diagnosis not present

## 2015-03-15 DIAGNOSIS — I10 Essential (primary) hypertension: Secondary | ICD-10-CM | POA: Diagnosis not present

## 2015-03-15 DIAGNOSIS — R300 Dysuria: Secondary | ICD-10-CM | POA: Diagnosis not present

## 2015-03-15 DIAGNOSIS — N183 Chronic kidney disease, stage 3 (moderate): Secondary | ICD-10-CM | POA: Diagnosis not present

## 2015-03-15 LAB — PLEASE NOTE

## 2015-03-15 LAB — URINE CULTURE

## 2015-03-16 ENCOUNTER — Telehealth: Payer: Self-pay

## 2015-03-16 LAB — COMPREHENSIVE METABOLIC PANEL
A/G RATIO: 1.4 (ref 1.1–2.5)
ALK PHOS: 55 IU/L (ref 39–117)
ALT: 15 IU/L (ref 0–32)
AST: 19 IU/L (ref 0–40)
Albumin: 4.2 g/dL (ref 3.6–4.8)
BUN/Creatinine Ratio: 13 (ref 11–26)
BUN: 40 mg/dL — ABNORMAL HIGH (ref 8–27)
Bilirubin Total: 0.3 mg/dL (ref 0.0–1.2)
CO2: 23 mmol/L (ref 18–29)
Calcium: 9.2 mg/dL (ref 8.7–10.3)
Chloride: 102 mmol/L (ref 97–108)
Creatinine, Ser: 3.08 mg/dL — ABNORMAL HIGH (ref 0.57–1.00)
GFR calc Af Amer: 17 mL/min/{1.73_m2} — ABNORMAL LOW (ref >59)
GFR calc non Af Amer: 15 mL/min/{1.73_m2} — ABNORMAL LOW (ref >59)
GLOBULIN, TOTAL: 3 g/dL (ref 1.5–4.5)
Glucose: 99 mg/dL (ref 65–99)
POTASSIUM: 4.8 mmol/L (ref 3.5–5.2)
SODIUM: 140 mmol/L (ref 134–144)
Total Protein: 7.2 g/dL (ref 6.0–8.5)

## 2015-03-16 LAB — CBC WITH DIFFERENTIAL/PLATELET
BASOS: 0 %
Basophils Absolute: 0 10*3/uL (ref 0.0–0.2)
EOS (ABSOLUTE): 0 10*3/uL (ref 0.0–0.4)
EOS: 1 %
HEMATOCRIT: 36.8 % (ref 34.0–46.6)
Hemoglobin: 11.8 g/dL (ref 11.1–15.9)
Immature Grans (Abs): 0 10*3/uL (ref 0.0–0.1)
Immature Granulocytes: 0 %
LYMPHS ABS: 1.4 10*3/uL (ref 0.7–3.1)
Lymphs: 37 %
MCH: 29 pg (ref 26.6–33.0)
MCHC: 32.1 g/dL (ref 31.5–35.7)
MCV: 90 fL (ref 79–97)
MONOS ABS: 0.3 10*3/uL (ref 0.1–0.9)
Monocytes: 8 %
NEUTROS ABS: 2 10*3/uL (ref 1.4–7.0)
Neutrophils: 54 %
Platelets: 228 10*3/uL (ref 150–379)
RBC: 4.07 x10E6/uL (ref 3.77–5.28)
RDW: 14.4 % (ref 12.3–15.4)
WBC: 3.7 10*3/uL (ref 3.4–10.8)

## 2015-03-16 LAB — TSH: TSH: 0.566 u[IU]/mL (ref 0.450–4.500)

## 2015-03-16 LAB — T4, FREE: Free T4: 1.61 ng/dL (ref 0.82–1.77)

## 2015-03-16 LAB — HEMOGLOBIN A1C
Est. average glucose Bld gHb Est-mCnc: 126 mg/dL
Hgb A1c MFr Bld: 6 % — ABNORMAL HIGH (ref 4.8–5.6)

## 2015-03-16 LAB — LIPID PANEL
CHOL/HDL RATIO: 5.8 ratio — AB (ref 0.0–4.4)
Cholesterol, Total: 321 mg/dL — ABNORMAL HIGH (ref 100–199)
HDL: 55 mg/dL (ref >39)
LDL Calculated: 234 mg/dL — ABNORMAL HIGH (ref 0–99)
Triglycerides: 162 mg/dL — ABNORMAL HIGH (ref 0–149)
VLDL CHOLESTEROL CAL: 32 mg/dL (ref 5–40)

## 2015-03-16 LAB — T3, FREE: T3 FREE: 2.4 pg/mL (ref 2.0–4.4)

## 2015-03-16 NOTE — Telephone Encounter (Signed)
Contacted this patient to review her labs. After she verified her date of birth, labs were reviewed. Patient was then asked if she wanted to start a medication to help decrease her cholesterol and she declined. I encouraged her to start some lifestyle changes and then discuss this in more detail with Dr. Nadine Counts when she comes in on 04/16/15. She agreed. i then asked if she wanted a copy of her results mailed to her home address and she said yes.   Labs were printed and mailed to the address listed in her record.

## 2015-04-11 DIAGNOSIS — N184 Chronic kidney disease, stage 4 (severe): Secondary | ICD-10-CM | POA: Diagnosis not present

## 2015-04-11 DIAGNOSIS — E785 Hyperlipidemia, unspecified: Secondary | ICD-10-CM | POA: Diagnosis not present

## 2015-04-11 DIAGNOSIS — E039 Hypothyroidism, unspecified: Secondary | ICD-10-CM | POA: Diagnosis not present

## 2015-04-11 DIAGNOSIS — R7309 Other abnormal glucose: Secondary | ICD-10-CM | POA: Diagnosis not present

## 2015-04-11 DIAGNOSIS — I10 Essential (primary) hypertension: Secondary | ICD-10-CM | POA: Diagnosis not present

## 2015-04-11 DIAGNOSIS — I129 Hypertensive chronic kidney disease with stage 1 through stage 4 chronic kidney disease, or unspecified chronic kidney disease: Secondary | ICD-10-CM | POA: Diagnosis not present

## 2015-04-16 ENCOUNTER — Ambulatory Visit: Payer: Medicare Other | Admitting: Family Medicine

## 2015-05-15 DIAGNOSIS — I129 Hypertensive chronic kidney disease with stage 1 through stage 4 chronic kidney disease, or unspecified chronic kidney disease: Secondary | ICD-10-CM | POA: Diagnosis not present

## 2015-05-15 DIAGNOSIS — N184 Chronic kidney disease, stage 4 (severe): Secondary | ICD-10-CM | POA: Diagnosis not present

## 2015-05-15 DIAGNOSIS — L0390 Cellulitis, unspecified: Secondary | ICD-10-CM | POA: Diagnosis not present

## 2015-05-15 DIAGNOSIS — Z9114 Patient's other noncompliance with medication regimen: Secondary | ICD-10-CM | POA: Diagnosis not present

## 2015-06-12 DIAGNOSIS — N184 Chronic kidney disease, stage 4 (severe): Secondary | ICD-10-CM | POA: Diagnosis not present

## 2015-06-12 DIAGNOSIS — I10 Essential (primary) hypertension: Secondary | ICD-10-CM | POA: Diagnosis not present

## 2015-06-12 DIAGNOSIS — G4730 Sleep apnea, unspecified: Secondary | ICD-10-CM | POA: Diagnosis not present

## 2015-06-19 DIAGNOSIS — J019 Acute sinusitis, unspecified: Secondary | ICD-10-CM | POA: Diagnosis not present

## 2015-06-21 DIAGNOSIS — R109 Unspecified abdominal pain: Secondary | ICD-10-CM | POA: Diagnosis not present

## 2015-06-21 DIAGNOSIS — J019 Acute sinusitis, unspecified: Secondary | ICD-10-CM | POA: Diagnosis not present

## 2015-07-17 DIAGNOSIS — R109 Unspecified abdominal pain: Secondary | ICD-10-CM | POA: Diagnosis not present

## 2015-09-03 DIAGNOSIS — H401131 Primary open-angle glaucoma, bilateral, mild stage: Secondary | ICD-10-CM | POA: Diagnosis not present

## 2015-09-21 DIAGNOSIS — J309 Allergic rhinitis, unspecified: Secondary | ICD-10-CM | POA: Diagnosis not present

## 2015-09-21 DIAGNOSIS — E785 Hyperlipidemia, unspecified: Secondary | ICD-10-CM | POA: Diagnosis not present

## 2015-09-21 DIAGNOSIS — I10 Essential (primary) hypertension: Secondary | ICD-10-CM | POA: Diagnosis not present

## 2015-09-21 DIAGNOSIS — R7303 Prediabetes: Secondary | ICD-10-CM | POA: Diagnosis not present

## 2015-09-21 DIAGNOSIS — E039 Hypothyroidism, unspecified: Secondary | ICD-10-CM | POA: Diagnosis not present

## 2015-10-15 ENCOUNTER — Other Ambulatory Visit: Payer: Self-pay | Admitting: Internal Medicine

## 2015-10-15 ENCOUNTER — Encounter: Payer: Self-pay | Admitting: Internal Medicine

## 2015-10-15 DIAGNOSIS — L309 Dermatitis, unspecified: Secondary | ICD-10-CM | POA: Diagnosis not present

## 2015-10-15 DIAGNOSIS — N189 Chronic kidney disease, unspecified: Secondary | ICD-10-CM | POA: Diagnosis not present

## 2015-10-15 DIAGNOSIS — I10 Essential (primary) hypertension: Secondary | ICD-10-CM | POA: Diagnosis not present

## 2015-10-15 DIAGNOSIS — E559 Vitamin D deficiency, unspecified: Secondary | ICD-10-CM | POA: Diagnosis not present

## 2015-10-15 DIAGNOSIS — R609 Edema, unspecified: Secondary | ICD-10-CM | POA: Diagnosis not present

## 2015-10-15 DIAGNOSIS — E785 Hyperlipidemia, unspecified: Secondary | ICD-10-CM | POA: Diagnosis not present

## 2015-10-15 DIAGNOSIS — R7309 Other abnormal glucose: Secondary | ICD-10-CM | POA: Diagnosis not present

## 2015-10-15 DIAGNOSIS — R7303 Prediabetes: Secondary | ICD-10-CM | POA: Diagnosis not present

## 2015-10-15 DIAGNOSIS — E039 Hypothyroidism, unspecified: Secondary | ICD-10-CM | POA: Diagnosis not present

## 2015-10-15 DIAGNOSIS — Z13818 Encounter for screening for other digestive system disorders: Secondary | ICD-10-CM | POA: Diagnosis not present

## 2015-10-15 DIAGNOSIS — M79606 Pain in leg, unspecified: Secondary | ICD-10-CM | POA: Diagnosis not present

## 2015-10-15 DIAGNOSIS — I8311 Varicose veins of right lower extremity with inflammation: Secondary | ICD-10-CM

## 2015-10-15 DIAGNOSIS — G4733 Obstructive sleep apnea (adult) (pediatric): Secondary | ICD-10-CM | POA: Diagnosis not present

## 2015-10-17 ENCOUNTER — Ambulatory Visit
Admission: RE | Admit: 2015-10-17 | Discharge: 2015-10-17 | Disposition: A | Discharge status: Home / Self Care | Payer: Medicare Other | Source: Ambulatory Visit | Attending: Internal Medicine | Admitting: Internal Medicine

## 2015-10-17 DIAGNOSIS — I8310 Varicose veins of unspecified lower extremity with inflammation: Secondary | ICD-10-CM | POA: Diagnosis not present

## 2015-10-17 DIAGNOSIS — I8311 Varicose veins of right lower extremity with inflammation: Secondary | ICD-10-CM | POA: Insufficient documentation

## 2015-10-18 ENCOUNTER — Other Ambulatory Visit: Payer: Self-pay | Admitting: Internal Medicine

## 2015-10-18 DIAGNOSIS — I10 Essential (primary) hypertension: Secondary | ICD-10-CM | POA: Diagnosis not present

## 2015-10-18 DIAGNOSIS — Z1231 Encounter for screening mammogram for malignant neoplasm of breast: Secondary | ICD-10-CM

## 2015-10-18 DIAGNOSIS — E669 Obesity, unspecified: Secondary | ICD-10-CM | POA: Diagnosis not present

## 2015-10-18 DIAGNOSIS — G4730 Sleep apnea, unspecified: Secondary | ICD-10-CM | POA: Diagnosis not present

## 2015-10-18 DIAGNOSIS — N184 Chronic kidney disease, stage 4 (severe): Secondary | ICD-10-CM | POA: Diagnosis not present

## 2015-10-25 DIAGNOSIS — Z1231 Encounter for screening mammogram for malignant neoplasm of breast: Secondary | ICD-10-CM | POA: Diagnosis not present

## 2015-10-25 DIAGNOSIS — R928 Other abnormal and inconclusive findings on diagnostic imaging of breast: Secondary | ICD-10-CM | POA: Diagnosis not present

## 2015-10-25 LAB — HM MAMMOGRAPHY

## 2015-10-30 ENCOUNTER — Other Ambulatory Visit: Payer: Self-pay | Admitting: Internal Medicine

## 2015-10-30 DIAGNOSIS — E785 Hyperlipidemia, unspecified: Secondary | ICD-10-CM | POA: Diagnosis not present

## 2015-10-30 DIAGNOSIS — G4733 Obstructive sleep apnea (adult) (pediatric): Secondary | ICD-10-CM | POA: Diagnosis not present

## 2015-10-30 DIAGNOSIS — I868 Varicose veins of other specified sites: Secondary | ICD-10-CM | POA: Diagnosis not present

## 2015-10-30 DIAGNOSIS — E039 Hypothyroidism, unspecified: Secondary | ICD-10-CM | POA: Diagnosis not present

## 2015-10-30 DIAGNOSIS — I10 Essential (primary) hypertension: Secondary | ICD-10-CM | POA: Diagnosis not present

## 2015-11-07 ENCOUNTER — Other Ambulatory Visit: Payer: Self-pay | Admitting: Internal Medicine

## 2015-11-07 DIAGNOSIS — Z1239 Encounter for other screening for malignant neoplasm of breast: Secondary | ICD-10-CM

## 2015-11-26 ENCOUNTER — Inpatient Hospital Stay: Admission: RE | Admit: 2015-11-26 | Payer: Medicare Other | Source: Ambulatory Visit

## 2015-11-27 DIAGNOSIS — R928 Other abnormal and inconclusive findings on diagnostic imaging of breast: Secondary | ICD-10-CM | POA: Diagnosis not present

## 2015-12-17 DIAGNOSIS — I8312 Varicose veins of left lower extremity with inflammation: Secondary | ICD-10-CM | POA: Diagnosis not present

## 2015-12-17 DIAGNOSIS — M7989 Other specified soft tissue disorders: Secondary | ICD-10-CM | POA: Diagnosis not present

## 2015-12-17 DIAGNOSIS — M79609 Pain in unspecified limb: Secondary | ICD-10-CM | POA: Diagnosis not present

## 2015-12-17 DIAGNOSIS — I872 Venous insufficiency (chronic) (peripheral): Secondary | ICD-10-CM | POA: Diagnosis not present

## 2015-12-17 DIAGNOSIS — I8311 Varicose veins of right lower extremity with inflammation: Secondary | ICD-10-CM | POA: Diagnosis not present

## 2016-02-01 ENCOUNTER — Encounter: Payer: Medicare Other | Admitting: Vascular Surgery

## 2016-02-01 ENCOUNTER — Encounter (HOSPITAL_COMMUNITY): Payer: Medicare Other

## 2016-02-07 DIAGNOSIS — E785 Hyperlipidemia, unspecified: Secondary | ICD-10-CM | POA: Diagnosis not present

## 2016-02-07 DIAGNOSIS — I10 Essential (primary) hypertension: Secondary | ICD-10-CM | POA: Diagnosis not present

## 2016-02-07 DIAGNOSIS — D709 Neutropenia, unspecified: Secondary | ICD-10-CM | POA: Diagnosis not present

## 2016-02-07 DIAGNOSIS — N189 Chronic kidney disease, unspecified: Secondary | ICD-10-CM | POA: Diagnosis not present

## 2016-02-08 DIAGNOSIS — E785 Hyperlipidemia, unspecified: Secondary | ICD-10-CM | POA: Diagnosis not present

## 2016-02-08 DIAGNOSIS — I10 Essential (primary) hypertension: Secondary | ICD-10-CM | POA: Diagnosis not present

## 2016-02-08 DIAGNOSIS — D709 Neutropenia, unspecified: Secondary | ICD-10-CM | POA: Diagnosis not present

## 2016-02-18 DIAGNOSIS — N184 Chronic kidney disease, stage 4 (severe): Secondary | ICD-10-CM | POA: Diagnosis not present

## 2016-02-18 DIAGNOSIS — G4730 Sleep apnea, unspecified: Secondary | ICD-10-CM | POA: Diagnosis not present

## 2016-02-18 DIAGNOSIS — E669 Obesity, unspecified: Secondary | ICD-10-CM | POA: Diagnosis not present

## 2016-02-18 DIAGNOSIS — I10 Essential (primary) hypertension: Secondary | ICD-10-CM | POA: Diagnosis not present

## 2016-02-18 DIAGNOSIS — N2581 Secondary hyperparathyroidism of renal origin: Secondary | ICD-10-CM | POA: Diagnosis not present

## 2016-03-13 DIAGNOSIS — H3509 Other intraretinal microvascular abnormalities: Secondary | ICD-10-CM | POA: Diagnosis not present

## 2016-03-13 DIAGNOSIS — H16223 Keratoconjunctivitis sicca, not specified as Sjogren's, bilateral: Secondary | ICD-10-CM | POA: Diagnosis not present

## 2016-03-13 DIAGNOSIS — H401131 Primary open-angle glaucoma, bilateral, mild stage: Secondary | ICD-10-CM | POA: Diagnosis not present

## 2016-04-02 DIAGNOSIS — H16223 Keratoconjunctivitis sicca, not specified as Sjogren's, bilateral: Secondary | ICD-10-CM | POA: Diagnosis not present

## 2016-04-02 DIAGNOSIS — H3509 Other intraretinal microvascular abnormalities: Secondary | ICD-10-CM | POA: Diagnosis not present

## 2016-04-02 DIAGNOSIS — H401131 Primary open-angle glaucoma, bilateral, mild stage: Secondary | ICD-10-CM | POA: Diagnosis not present

## 2016-04-16 DIAGNOSIS — H3509 Other intraretinal microvascular abnormalities: Secondary | ICD-10-CM | POA: Diagnosis not present

## 2016-04-16 DIAGNOSIS — H16223 Keratoconjunctivitis sicca, not specified as Sjogren's, bilateral: Secondary | ICD-10-CM | POA: Diagnosis not present

## 2016-04-16 DIAGNOSIS — H401131 Primary open-angle glaucoma, bilateral, mild stage: Secondary | ICD-10-CM | POA: Diagnosis not present

## 2016-05-08 ENCOUNTER — Other Ambulatory Visit: Payer: Self-pay

## 2016-05-12 ENCOUNTER — Encounter (INDEPENDENT_AMBULATORY_CARE_PROVIDER_SITE_OTHER): Payer: Self-pay | Admitting: Vascular Surgery

## 2016-05-12 ENCOUNTER — Ambulatory Visit (INDEPENDENT_AMBULATORY_CARE_PROVIDER_SITE_OTHER): Payer: Medicare Other | Admitting: Vascular Surgery

## 2016-05-12 VITALS — BP 142/75 | HR 50 | Resp 16 | Ht 62.0 in | Wt 198.0 lb

## 2016-05-12 DIAGNOSIS — N183 Chronic kidney disease, stage 3 unspecified: Secondary | ICD-10-CM

## 2016-05-12 DIAGNOSIS — I890 Lymphedema, not elsewhere classified: Secondary | ICD-10-CM

## 2016-05-12 DIAGNOSIS — D72819 Decreased white blood cell count, unspecified: Secondary | ICD-10-CM | POA: Diagnosis not present

## 2016-05-12 DIAGNOSIS — I129 Hypertensive chronic kidney disease with stage 1 through stage 4 chronic kidney disease, or unspecified chronic kidney disease: Secondary | ICD-10-CM | POA: Diagnosis not present

## 2016-05-12 DIAGNOSIS — I872 Venous insufficiency (chronic) (peripheral): Secondary | ICD-10-CM

## 2016-05-12 DIAGNOSIS — G4733 Obstructive sleep apnea (adult) (pediatric): Secondary | ICD-10-CM | POA: Diagnosis not present

## 2016-05-12 DIAGNOSIS — E039 Hypothyroidism, unspecified: Secondary | ICD-10-CM | POA: Diagnosis not present

## 2016-05-12 DIAGNOSIS — E785 Hyperlipidemia, unspecified: Secondary | ICD-10-CM | POA: Diagnosis not present

## 2016-05-12 DIAGNOSIS — E669 Obesity, unspecified: Secondary | ICD-10-CM | POA: Diagnosis not present

## 2016-05-12 DIAGNOSIS — N189 Chronic kidney disease, unspecified: Secondary | ICD-10-CM | POA: Diagnosis not present

## 2016-05-12 DIAGNOSIS — I10 Essential (primary) hypertension: Secondary | ICD-10-CM | POA: Diagnosis not present

## 2016-05-12 NOTE — Progress Notes (Signed)
MRN : [id number]  Holly Mcgee is a 70 y.o. (1945/06/15) female who presents with chief complaint of  Chief Complaint  Patient presents with  . Follow-up  .  History of Present Illness: The patient returns to the office for followup evaluation regarding leg swelling.  The swelling has improved quite a bit and the pain associated with swelling has decreased substantially. There have not been any interval development of a ulcerations or wounds.  Since the previous visit the patient has been wearing graduated compression stockings and has noted little significant improvement in the lymphedema. The patient has been using compression routinely morning until night.  The patient also states elevation during the day and exercise is being done too.  Venous duplex done at the hospital is negative for DVT, scattered varicosities are noted     Current Meds  Medication Sig  . ALPHAGAN P 0.1 % SOLN   . amLODipine (NORVASC) 5 MG tablet   . atenolol (TENORMIN) 100 MG tablet TK 1 T PO D  . Cholecalciferol (VITAMIN D-1000 MAX ST) 1000 UNITS tablet Take by mouth.  . cycloSPORINE (RESTASIS) 0.05 % ophthalmic emulsion Place 1 drop into both eyes 2 (two) times daily.  . dorzolamide-timolol (COSOPT) 22.3-6.8 MG/ML ophthalmic solution Apply to eye.  . furosemide (LASIX) 40 MG tablet TK 1 T PO D  . hydrALAZINE (APRESOLINE) 10 MG tablet Take 1 tablet (10 mg total) by mouth 3 (three) times daily.  Marland Kitchen TIROSINT 50 MCG CAPS TK 1 C PO ONCE A DAY  . Travoprost, BAK Free, (TRAVATAN Z) 0.004 % SOLN ophthalmic solution Apply to eye.    Past Medical History:  Diagnosis Date  . Allergy   . Chronic kidney disease   . Hyperlipidemia   . Hypertension   . Thyroid disease     Past Surgical History:  Procedure Laterality Date  . ABDOMINAL HYSTERECTOMY    . KIDNEY CYST REMOVAL  12/2012   Performed at [facility]  . UTERINE FIBROID SURGERY     In [place] several years ago  . VEIN LIGATION AND STRIPPING Left    Performed in [place]    Social History Social History  Substance Use Topics  . Smoking status: Never Smoker  . Smokeless tobacco: Never Used  . Alcohol use No    Family History Family History  Problem Relation Age of Onset  . Hyperlipidemia Mother   . Hypertension Mother   . Diabetes Mother   . Hyperlipidemia Father   . Hypertension Father   . Cancer Father     colon  . Stroke Father     complications from HTN  . Mental illness Daughter   . Diabetes Son     hereditary  No family history of bleeding/clotting disorders, porphyria or autoimmune disease   Allergies  Allergen Reactions  . Citrus Itching and Swelling     REVIEW OF SYSTEMS (Negative unless checked)  Constitutional: [] Weight loss  [] Fever  [] Chills Cardiac: [] Chest pain   [] Chest pressure   [] Palpitations   [] Shortness of breath when laying flat   [] Shortness of breath with exertion. Vascular:  [x] Pain in legs with walking   [] Pain in legs at rest  [] History of DVT   [] Phlebitis   [x] Swelling in legs   [x] Varicose veins   [] Non-healing ulcers Pulmonary:   [] Uses home oxygen   [] Productive cough   [] Hemoptysis   [] Wheeze  [] COPD   [] Asthma Neurologic:  [] Dizziness   [] Seizures   [] History of  stroke   [] History of TIA  [] Aphasia   [] Vissual changes   [] Weakness or numbness in arm   [] Weakness or numbness in leg Musculoskeletal:   [x] Joint swelling   [x] Joint pain   [] Low back pain Hematologic:  [] Easy bruising  [] Easy bleeding   [] Hypercoagulable state   [] Anemic Gastrointestinal:  [] Diarrhea   [] Vomiting  [] Gastroesophageal reflux/heartburn   [] Difficulty swallowing. Genitourinary:  [x] Chronic kidney disease   [] Difficult urination  [] Frequent urination   [] Blood in urine Skin:  [] Rashes   [] Ulcers  Psychological:  [] History of anxiety   []  History of major depression.  Physical Examination  Vitals:   05/12/16 1127  BP: (!) 142/75  Pulse: (!) 50  Resp: 16  Weight: 89.8 kg (198 lb)  Height: 5\' 2"   (1.575 m)   Body mass index is 36.21 kg/m. Gen: WD/WN, NAD Head: [place]/AT, No temporalis wasting.  Ear/Nose/Throat: Hearing grossly intact, nares w/o erythema or drainage, poor dentition Eyes: PER, EOMI, sclera nonicteric.  Neck: Supple, no masses.  No bruit or JVD.  Pulmonary:  Good air movement, clear to auscultation bilaterally, no use of accessory muscles.  Cardiac: RRR, normal S1, S2, no Murmurs. Vascular:  Moderate to severe venous stasis changes of the right ankle, scattered varicosities bilaterally.  2+ pitting edema noted Vessel Right Left  Radial Palpable Palpable  Ulnar Palpable Palpable  Brachial Palpable Palpable  Carotid Palpable Palpable  Femoral Palpable Palpable  Popliteal Palpable Palpable  PT Palpable Palpable  DP Palpable Palpable   Gastrointestinal: soft, non-distended. No guarding/no peritoneal signs.  Musculoskeletal: M/S 5/5 throughout.  No deformity or atrophy.  Neurologic: CN 2-12 intact. Pain and light touch intact in extremities.  Symmetrical.  Speech is fluent. Motor exam as listed above. Psychiatric: Judgment intact, Mood & affect appropriate for pt's clinical situation. Dermatologic: No rashes or ulcers noted.  No changes consistent with cellulitis. Lymph : No Cervical lymphadenopathy, no lichenification or skin changes of chronic lymphedema.  CBC Lab Results  Component Value Date   WBC 3.7 03/15/2015   HGB 11.8 (L) 03/26/2014   HCT 36.8 03/15/2015   MCV 90 03/15/2015   PLT 228 03/15/2015    BMET    Component Value Date/Time   NA 140 03/15/2015 0926   NA 144 03/26/2014 2120   K 4.8 03/15/2015 0926   K 3.8 03/26/2014 2120   CL 102 03/15/2015 0926   CL 112 (H) 03/26/2014 2120   CO2 23 03/15/2015 0926   CO2 23 03/26/2014 2120   GLUCOSE 99 03/15/2015 0926   GLUCOSE 124 (H) 03/26/2014 2120   BUN 40 (H) 03/15/2015 0926   BUN 50 (H) 03/26/2014 2120   CREATININE 3.08 (H) 03/15/2015 0926   CREATININE 3.30 (H) 03/26/2014 2120   CALCIUM 9.2  03/15/2015 0926   CALCIUM 8.4 (L) 03/26/2014 2120   GFRNONAA 15 (L) 03/15/2015 0926   GFRNONAA 15 (L) 03/26/2014 2120   GFRAA 17 (L) 03/15/2015 0926   GFRAA 18 (L) 03/26/2014 2120   CrCl cannot be calculated ([name] most recent lab result is older than the maximum 21 days allowed.).  COAG No results found for: INR, PROTIME  Radiology No results found.  Assessment/Plan 1. Venous stasis dermatitis of right lower extremity No surgery or intervention at this point in time.  I have reviewed my discussion with the patient regarding venous insufficiency and why it causes symptoms. I have discussed with the patient the chronic skin changes that accompany venous insufficiency and the long term sequela such as  ulceration. Patient will contnue wearing graduated compression stockings on a daily basis, as this has provided excellent control of his edema. The patient will put the stockings on first thing in the morning and removing them in the evening. The patient is reminded not to sleep in the stockings.  In addition, behavioral modification including elevation during the day will be initiated. Exercise is strongly encouraged.  Duplex ultrasound of the right lower extremity shows no DVT scattered varicose veins  Given the patient's good control and lack of any problems regarding the venous insufficiency and lymphedema a lymph pump in not need at this time.  The patient will follow up with me PRN should anything change.  The patient voices agreement with this plan.   2. Chronic venous insufficiency See #1  3. Lymphedema See #1  4. 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  Avoid nephrotoxic medications, no NSAIDs  5. Hypertension goal BP (blood pressure) < 140/90 Continue antihypertensive medications as already ordered and reviewed, no changes at this time.    Hortencia Pilar, MD  05/12/2016 11:55 AM

## 2016-05-15 DIAGNOSIS — H16223 Keratoconjunctivitis sicca, not specified as Sjogren's, bilateral: Secondary | ICD-10-CM | POA: Diagnosis not present

## 2016-05-15 DIAGNOSIS — H3509 Other intraretinal microvascular abnormalities: Secondary | ICD-10-CM | POA: Diagnosis not present

## 2016-05-15 DIAGNOSIS — H401131 Primary open-angle glaucoma, bilateral, mild stage: Secondary | ICD-10-CM | POA: Diagnosis not present

## 2016-06-24 DIAGNOSIS — N184 Chronic kidney disease, stage 4 (severe): Secondary | ICD-10-CM | POA: Diagnosis not present

## 2016-07-31 DIAGNOSIS — E669 Obesity, unspecified: Secondary | ICD-10-CM | POA: Diagnosis not present

## 2016-07-31 DIAGNOSIS — G4730 Sleep apnea, unspecified: Secondary | ICD-10-CM | POA: Diagnosis not present

## 2016-07-31 DIAGNOSIS — N184 Chronic kidney disease, stage 4 (severe): Secondary | ICD-10-CM | POA: Diagnosis not present

## 2016-07-31 DIAGNOSIS — I10 Essential (primary) hypertension: Secondary | ICD-10-CM | POA: Diagnosis not present

## 2016-07-31 DIAGNOSIS — N2581 Secondary hyperparathyroidism of renal origin: Secondary | ICD-10-CM | POA: Diagnosis not present

## 2016-08-11 DIAGNOSIS — D72819 Decreased white blood cell count, unspecified: Secondary | ICD-10-CM | POA: Diagnosis not present

## 2016-08-11 DIAGNOSIS — E669 Obesity, unspecified: Secondary | ICD-10-CM | POA: Diagnosis not present

## 2016-08-11 DIAGNOSIS — I10 Essential (primary) hypertension: Secondary | ICD-10-CM | POA: Diagnosis not present

## 2016-08-11 DIAGNOSIS — N189 Chronic kidney disease, unspecified: Secondary | ICD-10-CM | POA: Diagnosis not present

## 2016-08-11 DIAGNOSIS — G4733 Obstructive sleep apnea (adult) (pediatric): Secondary | ICD-10-CM | POA: Diagnosis not present

## 2016-08-11 DIAGNOSIS — E785 Hyperlipidemia, unspecified: Secondary | ICD-10-CM | POA: Diagnosis not present

## 2016-08-11 DIAGNOSIS — I498 Other specified cardiac arrhythmias: Secondary | ICD-10-CM | POA: Diagnosis not present

## 2016-08-13 ENCOUNTER — Other Ambulatory Visit: Payer: Self-pay | Admitting: Vascular Surgery

## 2016-08-13 DIAGNOSIS — Z01810 Encounter for preprocedural cardiovascular examination: Secondary | ICD-10-CM

## 2016-08-13 DIAGNOSIS — D649 Anemia, unspecified: Secondary | ICD-10-CM | POA: Diagnosis not present

## 2016-08-13 DIAGNOSIS — N184 Chronic kidney disease, stage 4 (severe): Secondary | ICD-10-CM

## 2016-08-13 DIAGNOSIS — E785 Hyperlipidemia, unspecified: Secondary | ICD-10-CM | POA: Diagnosis not present

## 2016-08-13 DIAGNOSIS — N189 Chronic kidney disease, unspecified: Secondary | ICD-10-CM | POA: Diagnosis not present

## 2016-08-13 DIAGNOSIS — D72819 Decreased white blood cell count, unspecified: Secondary | ICD-10-CM | POA: Diagnosis not present

## 2016-08-18 DIAGNOSIS — D649 Anemia, unspecified: Secondary | ICD-10-CM | POA: Diagnosis not present

## 2016-08-18 DIAGNOSIS — N189 Chronic kidney disease, unspecified: Secondary | ICD-10-CM | POA: Diagnosis not present

## 2016-08-18 DIAGNOSIS — I10 Essential (primary) hypertension: Secondary | ICD-10-CM | POA: Diagnosis not present

## 2016-08-18 DIAGNOSIS — E785 Hyperlipidemia, unspecified: Secondary | ICD-10-CM | POA: Diagnosis not present

## 2016-08-18 DIAGNOSIS — E669 Obesity, unspecified: Secondary | ICD-10-CM | POA: Diagnosis not present

## 2016-08-22 DIAGNOSIS — E7800 Pure hypercholesterolemia, unspecified: Secondary | ICD-10-CM | POA: Diagnosis not present

## 2016-08-22 DIAGNOSIS — R0602 Shortness of breath: Secondary | ICD-10-CM | POA: Diagnosis not present

## 2016-08-22 DIAGNOSIS — I10 Essential (primary) hypertension: Secondary | ICD-10-CM | POA: Diagnosis not present

## 2016-08-22 DIAGNOSIS — R001 Bradycardia, unspecified: Secondary | ICD-10-CM | POA: Diagnosis not present

## 2016-08-28 DIAGNOSIS — R001 Bradycardia, unspecified: Secondary | ICD-10-CM | POA: Diagnosis not present

## 2016-09-02 DIAGNOSIS — E669 Obesity, unspecified: Secondary | ICD-10-CM | POA: Diagnosis not present

## 2016-09-02 DIAGNOSIS — M25511 Pain in right shoulder: Secondary | ICD-10-CM | POA: Diagnosis not present

## 2016-09-02 DIAGNOSIS — I10 Essential (primary) hypertension: Secondary | ICD-10-CM | POA: Diagnosis not present

## 2016-09-03 DIAGNOSIS — H35033 Hypertensive retinopathy, bilateral: Secondary | ICD-10-CM | POA: Diagnosis not present

## 2016-09-03 DIAGNOSIS — H16223 Keratoconjunctivitis sicca, not specified as Sjogren's, bilateral: Secondary | ICD-10-CM | POA: Diagnosis not present

## 2016-09-03 DIAGNOSIS — H401131 Primary open-angle glaucoma, bilateral, mild stage: Secondary | ICD-10-CM | POA: Diagnosis not present

## 2016-09-24 ENCOUNTER — Encounter: Payer: Self-pay | Admitting: Vascular Surgery

## 2016-09-25 DIAGNOSIS — M79603 Pain in arm, unspecified: Secondary | ICD-10-CM | POA: Diagnosis not present

## 2016-09-25 DIAGNOSIS — Z9119 Patient's noncompliance with other medical treatment and regimen: Secondary | ICD-10-CM | POA: Diagnosis not present

## 2016-09-25 DIAGNOSIS — N189 Chronic kidney disease, unspecified: Secondary | ICD-10-CM | POA: Diagnosis not present

## 2016-09-25 DIAGNOSIS — M25519 Pain in unspecified shoulder: Secondary | ICD-10-CM | POA: Diagnosis not present

## 2016-09-25 DIAGNOSIS — I10 Essential (primary) hypertension: Secondary | ICD-10-CM | POA: Diagnosis not present

## 2016-09-25 DIAGNOSIS — E669 Obesity, unspecified: Secondary | ICD-10-CM | POA: Diagnosis not present

## 2016-10-01 ENCOUNTER — Ambulatory Visit (INDEPENDENT_AMBULATORY_CARE_PROVIDER_SITE_OTHER): Payer: Medicare Other | Admitting: Vascular Surgery

## 2016-10-01 ENCOUNTER — Ambulatory Visit (HOSPITAL_COMMUNITY)
Admission: RE | Admit: 2016-10-01 | Discharge: 2016-10-01 | Disposition: A | Discharge status: Home / Self Care | Payer: Medicare Other | Source: Ambulatory Visit | Attending: Vascular Surgery | Admitting: Vascular Surgery

## 2016-10-01 ENCOUNTER — Encounter: Payer: Self-pay | Admitting: Vascular Surgery

## 2016-10-01 VITALS — BP 164/84 | HR 55 | Temp 98.0°F | Resp 20 | Ht 62.0 in | Wt 201.0 lb

## 2016-10-01 DIAGNOSIS — N184 Chronic kidney disease, stage 4 (severe): Secondary | ICD-10-CM | POA: Diagnosis not present

## 2016-10-01 DIAGNOSIS — Z01810 Encounter for preprocedural cardiovascular examination: Secondary | ICD-10-CM

## 2016-10-01 NOTE — Progress Notes (Signed)
Patient name: Holly Mcgee MRN: [id number] DOB: March 14, 1946 Sex: female   REASON FOR CONSULT:    Evaluate for hemodialysis access. Referred by Dr. Florene Glen.  HPI:   Holly Mcgee is a 71 y.o. female, who was referred for evaluation for new access. The patient is not yet on dialysis. She denies any recent uremic symptoms. Specifically, she denies nausea, vomiting, fatigue, anorexia, or palpitations.  She is right-handed. She does not have a pacemaker nor has she had previous catheters.  I have reviewed the records that came from [facility]. She has stage IV chronic kidney disease. This is reportedly from polycystic kidney disease. The patient also has a history of obesity, sleep apnea, secondary hyperparathyroidism, and hypertension.  Past Medical History:  Diagnosis Date  . Allergy   . Chronic kidney disease   . Hyperlipidemia   . Hypertension   . Thyroid disease     Family History  Problem Relation Age of Onset  . Hyperlipidemia Mother   . Hypertension Mother   . Diabetes Mother   . Hyperlipidemia Father   . Hypertension Father   . Cancer Father     colon  . Stroke Father     complications from HTN  . Mental illness Daughter   . Diabetes Son     hereditary    SOCIAL HISTORY: Social History   Social History  . Marital status: Married    Spouse name: N/A  . Number of children: N/A  . Years of education: N/A   Occupational History  . Not on file.   Social History Main Topics  . Smoking status: Never Smoker  . Smokeless tobacco: Never Used  . Alcohol use No  . Drug use: No  . Sexual activity: Not Currently   Other Topics Concern  . Not on file   Social History Narrative  . No narrative on file    Allergies  Allergen Reactions  . Citrus Itching and Swelling    Current Outpatient Prescriptions  Medication Sig Dispense Refill  . ALPHAGAN P 0.1 % SOLN     . carvedilol (COREG) 25 MG tablet Take 25 mg by mouth 2 (two) times  daily with a meal.    . dorzolamide-timolol (COSOPT) 22.3-6.8 MG/ML ophthalmic solution Apply to eye.    . hydrALAZINE (APRESOLINE) 10 MG tablet Take 1 tablet (10 mg total) by mouth 3 (three) times daily. 90 tablet 2  . TIROSINT 50 MCG CAPS TK 1 C PO ONCE A DAY  5  . travoprost, benzalkonium, (TRAVATAN) 0.004 % ophthalmic solution 1 drop at bedtime.     No current facility-administered medications for this visit.     REVIEW OF SYSTEMS:  [X]  denotes positive finding, [ ]  denotes negative finding Cardiac  Comments:  Chest pain or chest pressure:    Shortness of breath upon exertion:    Short of breath when lying flat:    Irregular heart rhythm:        Vascular    Pain in calf, thigh, or hip brought on by ambulation:    Pain in feet at night that wakes you up from your sleep:     Blood clot in your veins:    Leg swelling:  X       Pulmonary    Oxygen at home:    Productive cough:     Wheezing:         Neurologic    Sudden weakness in arms or legs:  Sudden numbness in arms or legs:     Sudden onset of difficulty speaking or slurred speech:    Temporary loss of vision in one eye:     Problems with dizziness:         Gastrointestinal    Blood in stool:     Vomited blood:         Genitourinary    Burning when urinating:     Blood in urine:        Psychiatric    Major depression:         Hematologic    Bleeding problems:    Problems with blood clotting too easily:        Skin    Rashes or ulcers:        Constitutional    Fever or chills:     PHYSICAL EXAM:   Vitals:   10/01/16 1052  BP: (!) 164/84  Pulse: (!) 55  Resp: 20  Temp: 98 F (36.7 C)  TempSrc: Oral  SpO2: 99%  Weight: 201 lb (91.2 kg)  Height: 5\' 2"  (1.575 m)   GENERAL: The patient is a well-nourished female, in no acute distress. The vital signs are documented above. CARDIAC: There is a regular rate and rhythm.  VASCULAR: I do not detect carotid bruits. In the left arm I can palpate a  brachial and radial pulse. In the right arm, I could not palpate a radial pulse. PULMONARY: There is good air exchange bilaterally without wheezing or rales. ABDOMEN: Soft and non-tender with normal pitched bowel sounds.  MUSCULOSKELETAL: There are no major deformities or cyanosis. NEUROLOGIC: No focal weakness or paresthesias are detected. SKIN: There are no ulcers or rashes noted. PSYCHIATRIC: The patient has a normal affect.  DATA:    UPPER EXTREMITY ARTERIAL DUPLEX: I have independently interpreted her upper extremity arterial duplex scan.  On the right side there is a triphasic radial and ulnar waveform.  On the left side there is a triphasic radial and ulnar waveform.  UPPER EXTREMITY VEIN MAPPING: I have independently interpreted her upper extremity vein mapping.  On the right side the forearm and upper arm cephalic vein do not appear to be adequate in size. The basilic vein on the right is also marginal in size.  On the left side the forearm and upper arm cephalic vein does not appear to be adequate. The basilic vein is short and small.  I do not see any labs that were sent with the patient. The most recent GFR I have from October 2016 in [place] is 26.  MEDICAL ISSUES:   STAGE IV CHRONIC KIDNEY DISEASE: Based on the vein map the patient is likely not a candidate for a fistula. However, I have explained that I would look at the veins myself and fat all possible we would place a fistula. Most likely would have to place a graft.  I have explained the indications for placement of an AV fistula or AV graft. I've explained that if at all possible we will place an AV fistula.  I have reviewed the risks of placement of an AV fistula including but not limited to: failure of the fistula to mature, need for subsequent interventions, and thrombosis. In addition I have reviewed the potential complications of placement of an AV graft. These risks include, but are not limited to, graft  thrombosis, graft infection, wound healing problems, bleeding, arm swelling, and steal syndrome.   Currently she would like to discuss this further with  Dr. Florene Glen before scheduling surgery. Given that she will likely require a graft, I do not think this is unreasonable to wait.  [name] [facility] [phone]

## 2016-11-04 DIAGNOSIS — I10 Essential (primary) hypertension: Secondary | ICD-10-CM | POA: Diagnosis not present

## 2016-11-04 DIAGNOSIS — N189 Chronic kidney disease, unspecified: Secondary | ICD-10-CM | POA: Diagnosis not present

## 2016-11-04 DIAGNOSIS — I498 Other specified cardiac arrhythmias: Secondary | ICD-10-CM | POA: Diagnosis not present

## 2016-11-04 DIAGNOSIS — E669 Obesity, unspecified: Secondary | ICD-10-CM | POA: Diagnosis not present

## 2016-12-12 DIAGNOSIS — E669 Obesity, unspecified: Secondary | ICD-10-CM | POA: Diagnosis not present

## 2016-12-12 DIAGNOSIS — N2581 Secondary hyperparathyroidism of renal origin: Secondary | ICD-10-CM | POA: Diagnosis not present

## 2016-12-12 DIAGNOSIS — G4730 Sleep apnea, unspecified: Secondary | ICD-10-CM | POA: Diagnosis not present

## 2016-12-12 DIAGNOSIS — I10 Essential (primary) hypertension: Secondary | ICD-10-CM | POA: Diagnosis not present

## 2016-12-12 DIAGNOSIS — N184 Chronic kidney disease, stage 4 (severe): Secondary | ICD-10-CM | POA: Diagnosis not present

## 2016-12-30 DIAGNOSIS — H401131 Primary open-angle glaucoma, bilateral, mild stage: Secondary | ICD-10-CM | POA: Diagnosis not present

## 2016-12-30 DIAGNOSIS — H16223 Keratoconjunctivitis sicca, not specified as Sjogren's, bilateral: Secondary | ICD-10-CM | POA: Diagnosis not present

## 2017-03-04 DIAGNOSIS — H401131 Primary open-angle glaucoma, bilateral, mild stage: Secondary | ICD-10-CM | POA: Diagnosis not present

## 2017-03-04 DIAGNOSIS — H16223 Keratoconjunctivitis sicca, not specified as Sjogren's, bilateral: Secondary | ICD-10-CM | POA: Diagnosis not present

## 2017-04-02 DIAGNOSIS — H2513 Age-related nuclear cataract, bilateral: Secondary | ICD-10-CM | POA: Diagnosis not present

## 2017-04-02 DIAGNOSIS — H524 Presbyopia: Secondary | ICD-10-CM | POA: Diagnosis not present

## 2017-04-02 DIAGNOSIS — H5203 Hypermetropia, bilateral: Secondary | ICD-10-CM | POA: Diagnosis not present

## 2017-04-02 DIAGNOSIS — H52223 Regular astigmatism, bilateral: Secondary | ICD-10-CM | POA: Diagnosis not present

## 2017-05-22 ENCOUNTER — Encounter: Payer: Self-pay | Admitting: Internal Medicine

## 2017-05-22 ENCOUNTER — Telehealth: Payer: Self-pay

## 2017-05-22 ENCOUNTER — Ambulatory Visit (INDEPENDENT_AMBULATORY_CARE_PROVIDER_SITE_OTHER): Payer: Medicare Other | Admitting: Internal Medicine

## 2017-05-22 DIAGNOSIS — R001 Bradycardia, unspecified: Secondary | ICD-10-CM | POA: Diagnosis not present

## 2017-05-22 DIAGNOSIS — E039 Hypothyroidism, unspecified: Secondary | ICD-10-CM

## 2017-05-22 DIAGNOSIS — Z1329 Encounter for screening for other suspected endocrine disorder: Secondary | ICD-10-CM

## 2017-05-22 DIAGNOSIS — N189 Chronic kidney disease, unspecified: Secondary | ICD-10-CM

## 2017-05-22 DIAGNOSIS — N183 Chronic kidney disease, stage 3 unspecified: Secondary | ICD-10-CM

## 2017-05-22 DIAGNOSIS — F4321 Adjustment disorder with depressed mood: Secondary | ICD-10-CM

## 2017-05-22 DIAGNOSIS — E038 Other specified hypothyroidism: Secondary | ICD-10-CM

## 2017-05-22 DIAGNOSIS — H409 Unspecified glaucoma: Secondary | ICD-10-CM | POA: Diagnosis not present

## 2017-05-22 DIAGNOSIS — I10 Essential (primary) hypertension: Secondary | ICD-10-CM | POA: Diagnosis not present

## 2017-05-22 DIAGNOSIS — I129 Hypertensive chronic kidney disease with stage 1 through stage 4 chronic kidney disease, or unspecified chronic kidney disease: Secondary | ICD-10-CM | POA: Diagnosis not present

## 2017-05-22 DIAGNOSIS — Q613 Polycystic kidney, unspecified: Secondary | ICD-10-CM | POA: Diagnosis not present

## 2017-05-22 DIAGNOSIS — Z1159 Encounter for screening for other viral diseases: Secondary | ICD-10-CM

## 2017-05-22 DIAGNOSIS — E785 Hyperlipidemia, unspecified: Secondary | ICD-10-CM

## 2017-05-22 LAB — COMPREHENSIVE METABOLIC PANEL
ALBUMIN: 4 g/dL (ref 3.5–5.2)
ALT: 9 U/L (ref 0–35)
AST: 15 U/L (ref 0–37)
Alkaline Phosphatase: 47 U/L (ref 39–117)
BUN: 55 mg/dL — ABNORMAL HIGH (ref 6–23)
CALCIUM: 8.9 mg/dL (ref 8.4–10.5)
CHLORIDE: 106 meq/L (ref 96–112)
CO2: 25 meq/L (ref 19–32)
CREATININE: 3.91 mg/dL — AB (ref 0.40–1.20)
GFR: 14.54 mL/min — AB (ref >60.00)
Glucose, Bld: 90 mg/dL (ref 70–99)
POTASSIUM: 4 meq/L (ref 3.5–5.1)
Sodium: 138 mEq/L (ref 135–145)
Total Bilirubin: 0.4 mg/dL (ref 0.2–1.2)
Total Protein: 7.6 g/dL (ref 6.0–8.3)

## 2017-05-22 LAB — CBC WITH DIFFERENTIAL/PLATELET
BASOS PCT: 0.7 % (ref 0.0–3.0)
Basophils Absolute: 0 10*3/uL (ref 0.0–0.1)
EOS ABS: 0 10*3/uL (ref 0.0–0.7)
EOS PCT: 1.3 % (ref 0.0–5.0)
HEMATOCRIT: 34.3 % — AB (ref 36.0–46.0)
HEMOGLOBIN: 11.1 g/dL — AB (ref 12.0–15.0)
LYMPHS PCT: 35.5 % (ref 12.0–46.0)
Lymphs Abs: 1.2 10*3/uL (ref 0.7–4.0)
MCHC: 32.4 g/dL (ref 30.0–36.0)
MCV: 92.3 fl (ref 78.0–100.0)
MONOS PCT: 9.6 % (ref 3.0–12.0)
Monocytes Absolute: 0.3 10*3/uL (ref 0.1–1.0)
NEUTROS ABS: 1.8 10*3/uL (ref 1.4–7.7)
Neutrophils Relative %: 52.9 % (ref 43.0–77.0)
PLATELETS: 208 10*3/uL (ref 150.0–400.0)
RBC: 3.71 Mil/uL — ABNORMAL LOW (ref 3.87–5.11)
RDW: 15.5 % (ref 11.5–15.5)
WBC: 3.4 10*3/uL — AB (ref 4.0–10.5)

## 2017-05-22 LAB — URINALYSIS, ROUTINE W REFLEX MICROSCOPIC
Bilirubin Urine: NEGATIVE
KETONES UR: NEGATIVE
Nitrite: NEGATIVE
PH: 6.5 (ref 5.0–8.0)
SPECIFIC GRAVITY, URINE: 1.01 (ref 1.000–1.030)
Total Protein, Urine: 30 — AB
URINE GLUCOSE: NEGATIVE
UROBILINOGEN UA: 0.2 (ref 0.0–1.0)

## 2017-05-22 LAB — LIPID PANEL
CHOL/HDL RATIO: 5
Cholesterol: 258 mg/dL — ABNORMAL HIGH (ref 0–200)
HDL: 48.9 mg/dL (ref >39.00)
LDL CALC: 177 mg/dL — AB (ref 0–99)
NONHDL: 209.26
TRIGLYCERIDES: 163 mg/dL — AB (ref 0.0–149.0)
VLDL: 32.6 mg/dL (ref 0.0–40.0)

## 2017-05-22 LAB — TSH: TSH: 1.36 u[IU]/mL (ref 0.35–4.50)

## 2017-05-22 LAB — T4, FREE: Free T4: 0.93 ng/dL (ref 0.60–1.60)

## 2017-05-22 MED ORDER — HYDRALAZINE HCL 25 MG PO TABS: 25.0000 mg | ORAL_TABLET | Freq: Three times a day (TID) | ORAL | 0 refills | Status: DC

## 2017-05-22 MED ORDER — LEVOTHYROXINE SODIUM 50 MCG PO CAPS: 1.0000 | ORAL_CAPSULE | Freq: Every day | ORAL | 0 refills | Status: DC

## 2017-05-22 NOTE — Patient Instructions (Addendum)
Please f/u in 3-4 weeks  We will schedule appt Dr. Florene Glen  Take Care  Increase hydralazine to 25 3x per day, Coreg 6.25 2x per day  Add thyroid medication back will send to the pharmacy  Labs today   Hypertension Hypertension is another name for high blood pressure. High blood pressure forces your heart to work harder to pump blood. This can cause problems over time. There are two numbers in a blood pressure reading. There is a top number (systolic) over a bottom number (diastolic). It is best to have a blood pressure below 120/80. Healthy choices can help lower your blood pressure. You may need medicine to help lower your blood pressure if:  Your blood pressure cannot be lowered with healthy choices.  Your blood pressure is higher than 130/80.  Follow these instructions at home: Eating and drinking  If directed, follow the DASH eating plan. This diet includes: ? Filling half of your plate at each meal with fruits and vegetables. ? Filling one quarter of your plate at each meal with whole grains. Whole grains include whole wheat pasta, brown rice, and whole grain bread. ? Eating or drinking low-fat dairy products, such as skim milk or low-fat yogurt. ? Filling one quarter of your plate at each meal with low-fat (lean) proteins. Low-fat proteins include fish, skinless chicken, eggs, beans, and tofu. ? Avoiding fatty meat, cured and processed meat, or chicken with skin. ? Avoiding premade or processed food.  Eat less than 1,500 mg of salt (sodium) a day.  Limit alcohol use to no more than 1 drink a day for nonpregnant women and 2 drinks a day for men. One drink equals 12 oz of beer, 5 oz of wine, or 1 oz of hard liquor. Lifestyle  Work with your doctor to stay at a healthy weight or to lose weight. Ask your doctor what the best weight is for you.  Get at least 30 minutes of exercise that causes your heart to beat faster (aerobic exercise) most days of the week. This may include  walking, swimming, or biking.  Get at least 30 minutes of exercise that strengthens your muscles (resistance exercise) at least 3 days a week. This may include lifting weights or pilates.  Do not use any products that contain nicotine or tobacco. This includes cigarettes and e-cigarettes. If you need help quitting, ask your doctor.  Check your blood pressure at home as told by your doctor.  Keep all follow-up visits as told by your doctor. This is important. Medicines  Take over-the-counter and prescription medicines only as told by your doctor. Follow directions carefully.  Do not skip doses of blood pressure medicine. The medicine does not work as well if you skip doses. Skipping doses also puts you at risk for problems.  Ask your doctor about side effects or reactions to medicines that you should watch for. Contact a doctor if:  You think you are having a reaction to the medicine you are taking.  You have headaches that keep coming back (recurring).  You feel dizzy.  You have swelling in your ankles.  You have trouble with your vision. Get help right away if:  You get a very bad headache.  You start to feel confused.  You feel weak or numb.  You feel faint.  You get very bad pain in your: ? Chest. ? Belly (abdomen).  You throw up (vomit) more than once.  You have trouble breathing. Summary  Hypertension is another name for  high blood pressure.  Making healthy choices can help lower blood pressure. If your blood pressure cannot be controlled with healthy choices, you may need to take medicine. This information is not intended to replace advice given to you by your health care provider. Make sure you discuss any questions you have with your health care provider. Document Released: 11/05/2007 Document Revised: 04/16/2016 Document Reviewed: 04/16/2016 Elsevier Interactive Patient Education  [employer].

## 2017-05-22 NOTE — Telephone Encounter (Signed)
  CRITICAL VALUE STICKER  CRITICAL VALUE:14.54  RECEIVER (on-site recipient of call):Kristen Bare  DATE & TIME NOTIFIED: 05/22/17  MESSENGER (representative from lab):Saa -Shelba Flake  MD NOTIFIED: Dr Aundra Dubin  TIME OF NOTIFICATION:verbal  RESPONSE: referred back to nephrology per Dr Aundra Dubin

## 2017-05-23 LAB — HEPATITIS B SURFACE ANTIBODY, QUANTITATIVE

## 2017-05-25 ENCOUNTER — Encounter: Payer: Self-pay | Admitting: Internal Medicine

## 2017-05-25 DIAGNOSIS — R001 Bradycardia, unspecified: Secondary | ICD-10-CM | POA: Insufficient documentation

## 2017-05-25 DIAGNOSIS — F4321 Adjustment disorder with depressed mood: Secondary | ICD-10-CM | POA: Insufficient documentation

## 2017-05-25 NOTE — Progress Notes (Addendum)
Chief Complaint  Patient presents with  . Establish Care    former Dr. Kelly Services pt [facility]   Establish care. Former pt Dr. Kelly Services at [facility]  1. She reports husband [name] died 2017/05/27 and she has not been f/u with specialists or taking care of her health or eating much  2. HTN uncontrolled ? compliance  3. Hypothyroidism she has not taken thyroid medication in 4 months she reports  4. Glaucoma on several different of the same drops will clarify with [name]  5. 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 Florene Glen will resch    Review of Systems  Constitutional: Negative for weight loss.  HENT: Negative for hearing loss.   Eyes:       +h/o glaucoma  Respiratory: Negative for shortness of breath.   Cardiovascular: Negative for chest pain.  Gastrointestinal: Negative for abdominal pain.       Reduced appetite  Musculoskeletal: Negative for falls.  Skin: Negative for rash.  Neurological: Negative for headaches.  Psychiatric/Behavioral:       +grief due to husbands death    Past Medical History:  Diagnosis Date  . Allergy   . Chronic kidney disease    CKD 4/borderline 5   . Glaucoma   . Hyperlipidemia   . Hypertension   . Thyroid disease    hypothyroidism    Past Surgical History:  Procedure Laterality Date  . ABDOMINAL HYSTERECTOMY    . KIDNEY CYST REMOVAL  12/2012   Performed at [facility]  . UTERINE FIBROID SURGERY     In [place] several years ago  . VEIN LIGATION AND STRIPPING Left    Performed in [place]   Family History  Problem Relation Age of Onset  . Hyperlipidemia Mother   . Hypertension Mother   . Diabetes Mother   . Hyperlipidemia Father   . Hypertension Father   . Cancer Father        colon  . Stroke Father        complications from HTN  . Mental illness Daughter   . Diabetes Son        hereditary   Social History   Socioeconomic History  . Marital status: Married    Spouse name: Not on file  . Number of children: Not on file  . Years of education: Not  on file  . Highest education level: Not on file  Social Needs  . Financial resource strain: Not on file  . Food insecurity - worry: Not on file  . Food insecurity - inability: Not on file  . Transportation needs - medical: Not on file  . Transportation needs - non-medical: Not on file  Occupational History  . Not on file  Tobacco Use  . Smoking status: Never Smoker  . Smokeless tobacco: Never Used  Substance and Sexual Activity  . Alcohol use: No    Alcohol/week: 0.0 oz  . Drug use: No  . Sexual activity: Not Currently  Other Topics Concern  . Not on file  Social History Narrative   Husband died 2017/05/27 [name]   Current Meds  Medication Sig  . brimonidine (ALPHAGAN P) 0.1 % SOLN   . carvedilol (COREG) 6.25 MG tablet Take 6.25 mg by mouth 2 (two) times daily with a meal.  . dorzolamide-timolol (COSOPT) 22.3-6.8 MG/ML ophthalmic solution Apply to eye.  . furosemide (LASIX) 80 MG tablet Take 80 mg by mouth 2 (two) times daily.  . hydrALAZINE (APRESOLINE) 25 MG tablet Take  1 tablet (25 mg total) by mouth 3 (three) times daily.  . Latanoprostene Bunod (VYZULTA) 0.024 % SOLN Apply to eye daily.  . Levothyroxine Sodium (TIROSINT) 50 MCG CAPS Take 1 capsule (50 mcg total) by mouth daily before breakfast.  . Multiple Vitamins-Minerals (MULTI-DAY PLUS MINERALS PO) Take by mouth daily.  . niacin 100 MG tablet Take 100 mg by mouth at bedtime.  . selenium 50 MCG TABS tablet Take 50 mcg by mouth daily.  . travoprost, benzalkonium, (TRAVATAN) 0.004 % ophthalmic solution 1 drop at bedtime.  . [DISCONTINUED] Levothyroxine Sodium (TIROSINT) 50 MCG CAPS Take by mouth daily before breakfast.   Allergies  Allergen Reactions  . Citrus Itching and Swelling   Recent Results (from the past 2160 hour(s))  TSH     Status: None   Collection Time: 05/22/17 11:05 AM  Result Value Ref Range   TSH 1.36 0.35 - 4.50 uIU/mL  T4, free     Status: None   Collection Time: 05/22/17 11:05 AM  Result Value  Ref Range   Free T4 0.93 0.60 - 1.60 ng/dL    Comment: Specimens from patients who are undergoing biotin therapy and /or ingesting biotin supplements may contain high levels of biotin.  The higher biotin concentration in these specimens interferes with this Free T4 assay.  Specimens that contain high levels  of biotin may cause false high results for this Free T4 assay.  Please interpret results in light of the total clinical presentation of the patient.    Comprehensive metabolic panel     Status: Abnormal   Collection Time: 05/22/17 11:05 AM  Result Value Ref Range   Sodium 138 135 - 145 mEq/L   Potassium 4.0 3.5 - 5.1 mEq/L   Chloride 106 96 - 112 mEq/L   CO2 25 19 - 32 mEq/L   Glucose, Bld 90 70 - 99 mg/dL   BUN 55 (H) 6 - 23 mg/dL   Creatinine, Ser 3.91 (H) 0.40 - 1.20 mg/dL   Total Bilirubin 0.4 0.2 - 1.2 mg/dL   Alkaline Phosphatase 47 39 - 117 U/L   AST 15 0 - 37 U/L   ALT 9 0 - 35 U/L   Total Protein 7.6 6.0 - 8.3 g/dL   Albumin 4.0 3.5 - 5.2 g/dL   Calcium 8.9 8.4 - 10.5 mg/dL   GFR 14.54 (LL) >60.00 mL/min  CBC with Differential/Platelet     Status: Abnormal   Collection Time: 05/22/17 11:05 AM  Result Value Ref Range   WBC 3.4 (L) 4.0 - 10.5 K/uL   RBC 3.71 (L) 3.87 - 5.11 Mil/uL   Hemoglobin 11.1 (L) 12.0 - 15.0 g/dL   HCT 34.3 (L) 36.0 - 46.0 %   MCV 92.3 78.0 - 100.0 fl   MCHC 32.4 30.0 - 36.0 g/dL   RDW 15.5 11.5 - 15.5 %   Platelets 208.0 150.0 - 400.0 K/uL   Neutrophils Relative % 52.9 43.0 - 77.0 %   Lymphocytes Relative 35.5 12.0 - 46.0 %   Monocytes Relative 9.6 3.0 - 12.0 %   Eosinophils Relative 1.3 0.0 - 5.0 %   Basophils Relative 0.7 0.0 - 3.0 %   Neutro Abs 1.8 1.4 - 7.7 K/uL   Lymphs Abs 1.2 0.7 - 4.0 K/uL   Monocytes Absolute 0.3 0.1 - 1.0 K/uL   Eosinophils Absolute 0.0 0.0 - 0.7 K/uL   Basophils Absolute 0.0 0.0 - 0.1 K/uL  Lipid panel     Status: Abnormal  Collection Time: 05/22/17 11:05 AM  Result Value Ref Range   Cholesterol 258 (H) 0  - 200 mg/dL    Comment: ATP III Classification       Desirable:  < 200 mg/dL               Borderline High:  200 - 239 mg/dL          High:  > = 240 mg/dL   Triglycerides 163.0 (H) 0.0 - 149.0 mg/dL    Comment: Normal:  <150 mg/dLBorderline High:  150 - 199 mg/dL   HDL 48.90 >39.00 mg/dL   VLDL 32.6 0.0 - 40.0 mg/dL   LDL Cholesterol 177 (H) 0 - 99 mg/dL   Total CHOL/HDL Ratio 5     Comment:                Men          Women1/2 Average Risk     3.4          3.3Average Risk          5.0          4.42X Average Risk          9.6          7.13X Average Risk          15.0          11.0                       NonHDL 209.26     Comment: NOTE:  Non-HDL goal should be 30 mg/dL higher than patient's LDL goal (i.e. LDL goal of < 70 mg/dL, would have non-HDL goal of < 100 mg/dL)  Urinalysis, Routine w reflex microscopic     Status: Abnormal   Collection Time: 05/22/17 11:05 AM  Result Value Ref Range   Color, Urine YELLOW Yellow;Lt. Yellow   APPearance CLEAR Clear   Specific Gravity, Urine 1.010 1.000 - 1.030   pH 6.5 5.0 - 8.0   Total Protein, Urine 30 (A) Negative   Urine Glucose NEGATIVE Negative   Ketones, ur NEGATIVE Negative   Bilirubin Urine NEGATIVE Negative   Hgb urine dipstick TRACE-INTACT (A) Negative   Urobilinogen, UA 0.2 0.0 - 1.0   Leukocytes, UA MODERATE (A) Negative   Nitrite NEGATIVE Negative   WBC, UA TNTC(>50/hpf) (A) 0-2/hpf   RBC / HPF 0-2/hpf 0-2/hpf   Squamous Epithelial / LPF Rare(0-4/hpf) Rare(0-4/hpf)   Bacteria, UA Many(>50/hpf) (A) None  Hepatitis B surface antibody     Status: Abnormal   Collection Time: 05/22/17 11:05 AM  Result Value Ref Range   Hepatitis B-Post <5 (L) > OR = 10 mIU/mL    Comment: . Patient does not have immunity to hepatitis B virus. . For additional information, please refer to http://education.questdiagnostics.com/faq/FAQ105 (This link is being provided for informational/ educational purposes only).    Objective  There is no height or  weight on file to calculate BMI. Wt Readings from Last 3 Encounters:  10/01/16 201 lb (91.2 kg)  05/12/16 198 lb (89.8 kg)  03/14/15 200 lb 3.2 oz (90.8 kg)   Temp Readings from Last 3 Encounters:  10/01/16 98 F (36.7 C) (Oral)  03/14/15 97.8 F (36.6 C) (Oral)   BP Readings from Last 3 Encounters:  10/01/16 (!) 164/84  05/12/16 (!) 142/75  03/14/15 (!) 154/76   Pulse Readings from Last 3 Encounters:  10/01/16 (!) 55  05/12/16 (!) 50  03/14/15 (!) 55   O2 sat room air 96%  Physical Exam  Constitutional: She is oriented to person, place, and time and well-developed, well-nourished, and in no distress.  HENT:  Head: Normocephalic and atraumatic.  Mouth/Throat: Oropharynx is clear and moist and mucous membranes are normal. She has dentures.  Eyes: Conjunctivae are normal. Pupils are equal, round, and reactive to light.  Cardiovascular: Normal rate and regular rhythm.  Trace edema b/l legs   Pulmonary/Chest: Effort normal and breath sounds normal. She has no wheezes.  Abdominal: Soft. Bowel sounds are normal. There is no tenderness.  Neurological: She is alert and oriented to person, place, and time. Gait normal. Gait normal.  CN 2-12 grossly intact   Skin: Skin is warm, dry and intact. No rash noted.  Psychiatric: Mood, memory and judgment normal. She has a flat affect.  Nursing note and vitals reviewed.   Assessment   1.HTN uncontrolled ? Compliance with meds/HLD previously declined statin  2. Grief. 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 Hypothyroidism noncompliant with thyroid medication x 4 months  5. Glaucoma  6. HM 7. Bradycardia   Plan  1.  Encouraged pt to take meds on Coreg 6.25 mg bid, increase hydralazine from 25 bid to tid, on Lasix 80 mg bid as rx (per pt taking qd to bid prn) There have been several reasons the patient in in the past could not take ACE/ARB, CCB will need to review [facility] records  2. Expressed condolences  3. Refer back to Dr. Erling Cruz  rec control BP, emphasized compliance with BP meds  Fax notes and labs   4. Resume thyroid medications check labs today  5. On 4 different drops from eye MD after speaking with pharmacist will call [name] [name] and see which she is supposed to be taking as all similar could cause bradycardia  6.  Declines flu shot  rec Hep B vaccine not immune  Will need Tdap in future  Will disc shingrix in the future  Will disc pna vaccines in the future unsure if pt received though given Rx in the past-see [facility] records.  Check [facility] labs for hep C screening   H/o b/l assymetries mammo last 11/27/15 rec repeat in 1 year screening [facility] Out of age window pap  Colonoscopy 12/31/12 +polys hyperplastic GI Dr. Teresa Pelton [facility]  Consider DEXA in future if has not had   Labs today  Records from [facility] pending   7. See #5  Established with cardiology has missed appts will need to f/u in future   Of note called Dr. Rosita Fire office and she should only be on Vyzulta 1 drop b/l qhs and Dorzolamide 1 drop b/l tid for glaucoma on 06/01/17   Provider: Dr. [name] [name]-[facility] Medicine

## 2017-06-01 ENCOUNTER — Other Ambulatory Visit: Payer: Self-pay | Admitting: Internal Medicine

## 2017-06-01 DIAGNOSIS — H409 Unspecified glaucoma: Secondary | ICD-10-CM

## 2017-06-01 MED ORDER — LATANOPROSTENE BUNOD 0.024 % OP SOLN: 1.0000 [drp] | Freq: Every day | OPHTHALMIC | 0 refills | Status: DC

## 2017-06-01 MED ORDER — DORZOLAMIDE HCL 2 % OP SOLN: 1.0000 [drp] | Freq: Three times a day (TID) | OPHTHALMIC | 12 refills | Status: DC

## 2017-06-15 ENCOUNTER — Encounter: Payer: Self-pay | Admitting: Internal Medicine

## 2017-06-15 ENCOUNTER — Ambulatory Visit (INDEPENDENT_AMBULATORY_CARE_PROVIDER_SITE_OTHER): Payer: Medicare Other | Admitting: Internal Medicine

## 2017-06-15 VITALS — BP 138/82 | HR 58 | Temp 98.0°F | Ht 62.0 in | Wt 188.0 lb

## 2017-06-15 DIAGNOSIS — H409 Unspecified glaucoma: Secondary | ICD-10-CM

## 2017-06-15 DIAGNOSIS — I872 Venous insufficiency (chronic) (peripheral): Secondary | ICD-10-CM

## 2017-06-15 DIAGNOSIS — N183 Chronic kidney disease, stage 3 unspecified: Secondary | ICD-10-CM

## 2017-06-15 DIAGNOSIS — I890 Lymphedema, not elsewhere classified: Secondary | ICD-10-CM | POA: Diagnosis not present

## 2017-06-15 DIAGNOSIS — R001 Bradycardia, unspecified: Secondary | ICD-10-CM | POA: Diagnosis not present

## 2017-06-15 DIAGNOSIS — I129 Hypertensive chronic kidney disease with stage 1 through stage 4 chronic kidney disease, or unspecified chronic kidney disease: Secondary | ICD-10-CM | POA: Diagnosis not present

## 2017-06-15 DIAGNOSIS — E2839 Other primary ovarian failure: Secondary | ICD-10-CM

## 2017-06-15 DIAGNOSIS — I10 Essential (primary) hypertension: Secondary | ICD-10-CM | POA: Diagnosis not present

## 2017-06-15 NOTE — Patient Instructions (Addendum)
Please follow up in 3 months sooner if needed  Continue to take your blood pressure medications your blood pressure looks good yet  I referred you for bone density today at [facility]  Take care    Tdap/DTaP Vaccine (Diphtheria, Tetanus, and Pertussis): What You Need to Know 1. Why get vaccinated? Diphtheria, tetanus, and pertussis are serious diseases caused by bacteria. Diphtheria and pertussis are spread from person to person. Tetanus enters the body through cuts or wounds. DIPHTHERIA causes a thick covering in the back of the throat.  It can lead to breathing problems, paralysis, heart failure, and even death.  TETANUS (Lockjaw) causes painful tightening of the muscles, usually all over the body.  It can lead to "locking" of the jaw so the victim cannot open his mouth or swallow. Tetanus leads to death in up to 2 out of 10 cases.  PERTUSSIS (Whooping Cough) causes coughing spells so bad that it is hard for infants to eat, drink, or breathe. These spells can last for weeks.  It can lead to pneumonia, seizures (jerking and staring spells), brain damage, and death.  Diphtheria, tetanus, and pertussis vaccine (DTaP) can help prevent these diseases. Most children who are vaccinated with DTaP will be protected throughout childhood. Many more children would get these diseases if we stopped vaccinating. DTaP is a safer version of an older vaccine called DTP. DTP is no longer used in the [place]. 2. Who should get DTaP vaccine and when? Children should get 5 doses of DTaP vaccine, one dose at each of the following ages:  2 months  4 months  6 months  15-18 months  4-6 years  DTaP may be given at the same time as other vaccines. 3. Some children should not get DTaP vaccine or should wait  Children with minor illnesses, such as a cold, may be vaccinated. But children who are moderately or severely ill should usually wait until they recover before getting DTaP  vaccine.  Any child who had a life-threatening allergic reaction after a dose of DTaP should not get another dose.  Any child who suffered a brain or nervous system disease within 7 days after a dose of DTaP should not get another dose.  Talk with your doctor if your child: ? had a seizure or collapsed after a dose of DTaP, ? cried non-stop for 3 hours or more after a dose of DTaP, ? had a fever over 105F after a dose of DTaP. Ask your doctor for more information. Some of these children should not get another dose of pertussis vaccine, but may get a vaccine without pertussis, called DT. 4. Older children and adults DTaP is not licensed for adolescents, adults, or children 38 years of age and older. But older people still need protection. A vaccine called Tdap is similar to DTaP. A single dose of Tdap is recommended for people 11 through 72 years of age. Another vaccine, called Td, protects against tetanus and diphtheria, but not pertussis. It is recommended every 10 years. There are separate Vaccine Information Statements for these vaccines. 5. What are the risks from DTaP vaccine? Getting diphtheria, tetanus, or pertussis disease is much riskier than getting DTaP vaccine. However, a vaccine, like any medicine, is capable of causing serious problems, such as severe allergic reactions. The risk of DTaP vaccine causing serious harm, or death, is extremely small. Mild problems (common)  Fever (up to about 1 child in 4)  Redness or swelling where the shot was given (  up to about 1 child in 4)  Soreness or tenderness where the shot was given (up to about 1 child in 4) These problems occur more often after the 4th and 5th doses of the DTaP series than after earlier doses. Sometimes the 4th or 5th dose of DTaP vaccine is followed by swelling of the entire arm or leg in which the shot was given, lasting 1-7 days (up to about 1 child in 18). Other mild problems include:  Fussiness (up to about 1  child in 3)  Tiredness or poor appetite (up to about 1 child in 10)  Vomiting (up to about 1 child in 69) These problems generally occur 1-3 days after the shot. Moderate problems (uncommon)  Seizure (jerking or staring) (about 1 child out of 14,000)  Non-stop crying, for 3 hours or more (up to about 1 child out of 1,000)  High fever, over 105F (about 1 child out of 16,000) Severe problems (very rare)  Serious allergic reaction (less than 1 out of a million doses)  Several other severe problems have been reported after DTaP vaccine. These include: ? Long-term seizures, coma, or lowered consciousness ? Permanent brain damage. These are so rare it is hard to tell if they are caused by the vaccine. Controlling fever is especially important for children who have had seizures, for any reason. It is also important if another family member has had seizures. You can reduce fever and pain by giving your child an aspirin-free pain reliever when the shot is given, and for the next 24 hours, following the package instructions. 6. What if there is a serious reaction? What should I look for? Look for anything that concerns you, such as signs of a severe allergic reaction, very high fever, or behavior changes. Signs of a severe allergic reaction can include hives, swelling of the face and throat, difficulty breathing, a fast heartbeat, dizziness, and weakness. These would start a few minutes to a few hours after the vaccination. What should I do?  If you think it is a severe allergic reaction or other emergency that can't wait, call 9-1-1 or get the person to the nearest hospital. Otherwise, call your doctor.  Afterward, the reaction should be reported to the Vaccine Adverse Event Reporting System (VAERS). Your doctor might file this report, or you can do it yourself through the VAERS web site at www.vaers.SamedayNews.es, or by calling 843-817-7863. ? VAERS is only for reporting reactions. They do not  give medical advice. 7. The National Vaccine Injury Compensation Program The [employer] Vaccine Injury Compensation Program (VICP) is a federal program that was created to compensate people who may have been injured by certain vaccines. Persons who believe they may have been injured by a vaccine can learn about the program and about filing a claim by calling 320-653-1667 or visiting the [employer] website at GoldCloset.com.ee. 8. How can I learn more?  Ask your doctor.  Call your local or state health department.  Contact the Centers for Disease Control and Prevention (CDC): ? Call (740)370-0125 (1-800-CDC-INFO) or ? Visit CDC's website at http://hunter.com/ CDC DTaP Vaccine (Diphtheria, Tetanus, and Pertussis) VIS (10/16/05) This information is not intended to replace advice given to you by your health care provider. Make sure you discuss any questions you have with your health care provider. Document Released: 03/16/2006 Document Revised: 02/07/2016 Document Reviewed: 02/07/2016 Elsevier Interactive Patient Education  2017 [place].  Pneumococcal Conjugate Vaccine suspension for injection What is this medicine? PNEUMOCOCCAL VACCINE (NEU mo KOK  al vak SEEN) is a vaccine used to prevent pneumococcus bacterial infections. These bacteria can cause serious infections like pneumonia, meningitis, and blood infections. This vaccine will lower your chance of getting pneumonia. If you do get pneumonia, it can make your symptoms milder and your illness shorter. This vaccine will not treat an infection and will not cause infection. This vaccine is recommended for infants and young children, adults with certain medical conditions, and adults 34 years or older. This medicine may be used for other purposes; ask your health care provider or pharmacist if you have questions. COMMON BRAND NAME(S): Prevnar, Prevnar 13 What should I tell my health care provider before I take this medicine? They  need to know if you have any of these conditions: -bleeding problems -fever -immune system problems -an unusual or allergic reaction to pneumococcal vaccine, diphtheria toxoid, other vaccines, latex, other medicines, foods, dyes, or preservatives -pregnant or trying to get pregnant -breast-feeding How should I use this medicine? This vaccine is for injection into a muscle. It is given by a health care professional. A copy of Vaccine Information Statements will be given before each vaccination. Read this sheet carefully each time. The sheet may change frequently. Talk to your pediatrician regarding the use of this medicine in children. While this drug may be prescribed for children as young as 56 weeks old for selected conditions, precautions do apply. Overdosage: If you think you have taken too much of this medicine contact a poison control center or emergency room at once. NOTE: This medicine is only for you. Do not share this medicine with others. What if I miss a dose? It is important not to miss your dose. Call your doctor or health care professional if you are unable to keep an appointment. What may interact with this medicine? -medicines for cancer chemotherapy -medicines that suppress your immune function -steroid medicines like prednisone or cortisone This list may not describe all possible interactions. Give your health care provider a list of all the medicines, herbs, non-prescription drugs, or dietary supplements you use. Also tell them if you smoke, drink alcohol, or use illegal drugs. Some items may interact with your medicine. What should I watch for while using this medicine? Mild fever and pain should go away in 3 days or less. Report any unusual symptoms to your doctor or health care professional. What side effects may I notice from receiving this medicine? Side effects that you should report to your doctor or health care professional as soon as possible: -allergic reactions  like skin rash, itching or hives, swelling of the face, lips, or tongue -breathing problems -confused -fast or irregular heartbeat -fever over 102 degrees F -seizures -unusual bleeding or bruising -unusual muscle weakness Side effects that usually do not require medical attention (report to your doctor or health care professional if they continue or are bothersome): -aches and pains -diarrhea -fever of 102 degrees F or less -headache -irritable -loss of appetite -pain, tender at site where injected -trouble sleeping This list may not describe all possible side effects. Call your doctor for medical advice about side effects. You may report side effects to FDA at 1-800-FDA-1088. Where should I keep my medicine? This does not apply. This vaccine is given in a clinic, pharmacy, doctor's office, or other health care setting and will not be stored at home. NOTE: This sheet is a summary. It may not cover all possible information. If you have questions about this medicine, talk to your doctor, pharmacist, or health  care provider.  2018 Elsevier/Gold Standard (2014-02-23 10:27:27)

## 2017-06-17 ENCOUNTER — Encounter: Payer: Self-pay | Admitting: Internal Medicine

## 2017-06-17 NOTE — Progress Notes (Addendum)
Chief Complaint  Patient presents with  . Follow-up   Follow up  1. CKD 4 borderline 5 appt with Dr. Florene Glen 06/24/16  2. Glaucoma called [name] to see what she should be taking instead of Dorzolamide tid she is taking that combined w/ timolol bid  3. Hypothyroidism hx pt has been on Tirosint x months and wonders if she should resume last TSH 1.36     Review of Systems  Constitutional: Positive for weight loss.       Down 12-13 lbs   HENT: Negative for hearing loss.   Eyes:       +glaucoma   Respiratory: Negative for shortness of breath.   Cardiovascular: Negative for chest pain.  Gastrointestinal: Negative for abdominal pain.  Musculoskeletal: Negative for falls.  Skin: Negative for rash.  Neurological: Negative for headaches.  Psychiatric/Behavioral: Negative for memory loss.   Past Medical History:  Diagnosis Date  . Allergy   . Chicken pox   . Chronic kidney disease    CKD 4/borderline 5   . Emphysema of lung (HCC)    chronic bronchitis   . Glaucoma    [name] [name]  . Heart murmur   . Hyperlipidemia   . Hypertension   . Thyroid disease    hypothyroidism    Past Surgical History:  Procedure Laterality Date  . ABDOMINAL HYSTERECTOMY    . KIDNEY CYST REMOVAL  12/2012   Performed at [facility]  . UTERINE FIBROID SURGERY     In [place] several years ago  . VEIN LIGATION AND STRIPPING Left    Performed in [place]   Family History  Problem Relation Age of Onset  . Hypertension Mother   . Diabetes Mother   . Arthritis Mother   . Cancer Mother   . Hypertension Father   . Cancer Father        colon  . Stroke Father        complications from HTN  . Alcohol abuse Brother   . COPD Brother   . Depression Brother   . Drug abuse Brother   . Early death Brother   . Mental illness Daughter   . Diabetes Son   . Alcohol abuse Brother   . Drug abuse Brother   . Early death Brother    Social History   Socioeconomic History  . Marital status:  Married    Spouse name: Not on file  . Number of children: Not on file  . Years of education: Not on file  . Highest education level: Not on file  Social Needs  . Financial resource strain: Not on file  . Food insecurity - worry: Not on file  . Food insecurity - inability: Not on file  . Transportation needs - medical: Not on file  . Transportation needs - non-medical: Not on file  Occupational History  . Not on file  Tobacco Use  . Smoking status: Never Smoker  . Smokeless tobacco: Never Used  Substance and Sexual Activity  . Alcohol use: No    Alcohol/week: 0.0 oz  . Drug use: No  . Sexual activity: Not Currently  Other Topics Concern  . Not on file  Social History Narrative   Husband died 05-Jun-2017 [name]   4 kids    Current Meds  Medication Sig  . carvedilol (COREG) 6.25 MG tablet Take 6.25 mg by mouth 2 (two) times daily with a meal.  . dorzolamide-timolol (COSOPT) 22.3-6.8 MG/ML ophthalmic solution Place  1 drop into both eyes 2 (two) times daily.  . furosemide (LASIX) 80 MG tablet Take 80 mg by mouth 2 (two) times daily.  . hydrALAZINE (APRESOLINE) 25 MG tablet Take 1 tablet (25 mg total) by mouth 3 (three) times daily.  . Latanoprostene Bunod (VYZULTA) 0.024 % SOLN Apply 1 drop to eye at bedtime. Both eyes per [name]  . Multiple Vitamins-Minerals (MULTI-DAY PLUS MINERALS PO) Take by mouth daily.  . niacin 100 MG tablet Take 100 mg by mouth at bedtime.  . selenium 50 MCG TABS tablet Take 50 mcg by mouth daily.  . [DISCONTINUED] dorzolamide (TRUSOPT) 2 % ophthalmic solution Place 1 drop into both eyes 3 (three) times daily. [name]   Allergies  Allergen Reactions  . Citrus Itching and Swelling   Recent Results (from the past 2160 hour(s))  TSH     Status: None   Collection Time: 05/22/17 11:05 AM  Result Value Ref Range   TSH 1.36 0.35 - 4.50 uIU/mL  T4, free     Status: None   Collection Time: 05/22/17 11:05 AM  Result Value Ref Range   Free T4  0.93 0.60 - 1.60 ng/dL    Comment: Specimens from patients who are undergoing biotin therapy and /or ingesting biotin supplements may contain high levels of biotin.  The higher biotin concentration in these specimens interferes with this Free T4 assay.  Specimens that contain high levels  of biotin may cause false high results for this Free T4 assay.  Please interpret results in light of the total clinical presentation of the patient.    Comprehensive metabolic panel     Status: Abnormal   Collection Time: 05/22/17 11:05 AM  Result Value Ref Range   Sodium 138 135 - 145 mEq/L   Potassium 4.0 3.5 - 5.1 mEq/L   Chloride 106 96 - 112 mEq/L   CO2 25 19 - 32 mEq/L   Glucose, Bld 90 70 - 99 mg/dL   BUN 55 (H) 6 - 23 mg/dL   Creatinine, Ser 3.91 (H) 0.40 - 1.20 mg/dL   Total Bilirubin 0.4 0.2 - 1.2 mg/dL   Alkaline Phosphatase 47 39 - 117 U/L   AST 15 0 - 37 U/L   ALT 9 0 - 35 U/L   Total Protein 7.6 6.0 - 8.3 g/dL   Albumin 4.0 3.5 - 5.2 g/dL   Calcium 8.9 8.4 - 10.5 mg/dL   GFR 14.54 (LL) >60.00 mL/min  CBC with Differential/Platelet     Status: Abnormal   Collection Time: 05/22/17 11:05 AM  Result Value Ref Range   WBC 3.4 (L) 4.0 - 10.5 K/uL   RBC 3.71 (L) 3.87 - 5.11 Mil/uL   Hemoglobin 11.1 (L) 12.0 - 15.0 g/dL   HCT 34.3 (L) 36.0 - 46.0 %   MCV 92.3 78.0 - 100.0 fl   MCHC 32.4 30.0 - 36.0 g/dL   RDW 15.5 11.5 - 15.5 %   Platelets 208.0 150.0 - 400.0 K/uL   Neutrophils Relative % 52.9 43.0 - 77.0 %   Lymphocytes Relative 35.5 12.0 - 46.0 %   Monocytes Relative 9.6 3.0 - 12.0 %   Eosinophils Relative 1.3 0.0 - 5.0 %   Basophils Relative 0.7 0.0 - 3.0 %   Neutro Abs 1.8 1.4 - 7.7 K/uL   Lymphs Abs 1.2 0.7 - 4.0 K/uL   Monocytes Absolute 0.3 0.1 - 1.0 K/uL   Eosinophils Absolute 0.0 0.0 - 0.7 K/uL   Basophils  Absolute 0.0 0.0 - 0.1 K/uL  Lipid panel     Status: Abnormal   Collection Time: 05/22/17 11:05 AM  Result Value Ref Range   Cholesterol 258 (H) 0 - 200 mg/dL     Comment: ATP III Classification       Desirable:  < 200 mg/dL               Borderline High:  200 - 239 mg/dL          High:  > = 240 mg/dL   Triglycerides 163.0 (H) 0.0 - 149.0 mg/dL    Comment: Normal:  <150 mg/dLBorderline High:  150 - 199 mg/dL   HDL 48.90 >39.00 mg/dL   VLDL 32.6 0.0 - 40.0 mg/dL   LDL Cholesterol 177 (H) 0 - 99 mg/dL   Total CHOL/HDL Ratio 5     Comment:                Men          Women1/2 Average Risk     3.4          3.3Average Risk          5.0          4.42X Average Risk          9.6          7.13X Average Risk          15.0          11.0                       NonHDL 209.26     Comment: NOTE:  Non-HDL goal should be 30 mg/dL higher than patient's LDL goal (i.e. LDL goal of < 70 mg/dL, would have non-HDL goal of < 100 mg/dL)  Urinalysis, Routine w reflex microscopic     Status: Abnormal   Collection Time: 05/22/17 11:05 AM  Result Value Ref Range   Color, Urine YELLOW Yellow;Lt. Yellow   APPearance CLEAR Clear   Specific Gravity, Urine 1.010 1.000 - 1.030   pH 6.5 5.0 - 8.0   Total Protein, Urine 30 (A) Negative   Urine Glucose NEGATIVE Negative   Ketones, ur NEGATIVE Negative   Bilirubin Urine NEGATIVE Negative   Hgb urine dipstick TRACE-INTACT (A) Negative   Urobilinogen, UA 0.2 0.0 - 1.0   Leukocytes, UA MODERATE (A) Negative   Nitrite NEGATIVE Negative   WBC, UA TNTC(>50/hpf) (A) 0-2/hpf   RBC / HPF 0-2/hpf 0-2/hpf   Squamous Epithelial / LPF Rare(0-4/hpf) Rare(0-4/hpf)   Bacteria, UA Many(>50/hpf) (A) None  Hepatitis B surface antibody     Status: Abnormal   Collection Time: 05/22/17 11:05 AM  Result Value Ref Range   Hepatitis B-Post <5 (L) > OR = 10 mIU/mL    Comment: . Patient does not have immunity to hepatitis B virus. . For additional information, please refer to http://education.questdiagnostics.com/faq/FAQ105 (This link is being provided for informational/ educational purposes only).    Objective  Body mass index is 34.39 kg/m. Wt  Readings from Last 3 Encounters:  06/15/17 188 lb (85.3 kg)  10/01/16 201 lb (91.2 kg)  05/12/16 198 lb (89.8 kg)   Temp Readings from Last 3 Encounters:  06/15/17 98 F (36.7 C) (Oral)  10/01/16 98 F (36.7 C) (Oral)  03/14/15 97.8 F (36.6 C) (Oral)   BP Readings from Last 3 Encounters:  06/15/17 138/82  10/01/16 (!) 164/84  05/12/16 (!) 142/75  Pulse Readings from Last 3 Encounters:  06/15/17 (!) 58  10/01/16 (!) 55  05/12/16 (!) 50   O2 sat room air 98%  Physical Exam  Constitutional: She is oriented to person, place, and time and well-developed, well-nourished, and in no distress. Vital signs are normal.  HENT:  Head: Normocephalic and atraumatic.  Mouth/Throat: Oropharynx is clear and moist and mucous membranes are normal.  Eyes: Conjunctivae are normal. Pupils are equal, round, and reactive to light.  Cardiovascular: Regular rhythm and normal heart sounds. Bradycardia present.  Pulmonary/Chest: Effort normal and breath sounds normal.  Neurological: She is alert and oriented to person, place, and time. Gait normal. Gait normal.  Skin: Skin is warm, dry and intact.  Psychiatric: Mood, memory, affect and judgment normal.  Nursing note and vitals reviewed.   Assessment   1. HTN controlled today  2. H/o CKD 3 with now CKD 4 borderline 5 per [facility] 05/2017 14.5  3. Hypothyroidism  4. Glaucoma  5. Hm 6. Chronic lymphedema RLE  Plan  1.  Pt is doing coreg 6.25 bid and hydralazine was Rx tid she is only taking bid  BP controlled today monitor  2. F/u with renal 06/24/17 3. Will check TSH at f/u  Of note monitor nails c/o brittle nails today advised could be related to thyroid  hold Tirosint for now has not been taking x months and last tsh    Ref. Range 03/15/2015 09:26 05/22/2017 11:05  TSH Latest Ref Range: 0.35 - 4.50 uIU/mL 0.566 1.36  4. Cont meds per Dr. Venetia Maxon  5.   Declines flu shot  rec hep b vaccine not immune Tdap had 10/19/13 Dr. Heath Gold,  shingrix, prevnar, pna 23 in future if has not had  -hesistant to get vxs today given pna info to think about she reports she had pneumonia as kid   Declines further mammograms though last 11/27/15 [facility] with b/l asymmetries Last DEXA in [place] ago agreeable to repeat ordered today  Pap out of age window  Colonoscopy 01/03/13 hyperplastic polyps will disc with pt If wants repeat in 12/2017 at f/u  Never smoker    6. Has compression to wear.   Reviewed records [facility]  10/15/15 TC 286, TG 107, HDL 56, LDL 209 A1C 10/16/15 5.4  Hep C negative 10/18/15  vitmain D 38 10/18/15  ANA neg 08/13/16  Arterial duplex 05/17/14 neg  Provider: Dr. [name] [name]-[facility]

## 2017-06-28 ENCOUNTER — Encounter: Payer: Self-pay | Admitting: Internal Medicine

## 2017-07-09 DIAGNOSIS — H40033 Anatomical narrow angle, bilateral: Secondary | ICD-10-CM | POA: Diagnosis not present

## 2017-07-09 DIAGNOSIS — H40053 Ocular hypertension, bilateral: Secondary | ICD-10-CM | POA: Diagnosis not present

## 2017-07-09 DIAGNOSIS — H40023 Open angle with borderline findings, high risk, bilateral: Secondary | ICD-10-CM | POA: Diagnosis not present

## 2017-07-09 DIAGNOSIS — H401131 Primary open-angle glaucoma, bilateral, mild stage: Secondary | ICD-10-CM | POA: Diagnosis not present

## 2017-07-31 DIAGNOSIS — I120 Hypertensive chronic kidney disease with stage 5 chronic kidney disease or end stage renal disease: Secondary | ICD-10-CM | POA: Diagnosis not present

## 2017-07-31 DIAGNOSIS — N185 Chronic kidney disease, stage 5: Secondary | ICD-10-CM | POA: Diagnosis not present

## 2017-07-31 DIAGNOSIS — Q613 Polycystic kidney, unspecified: Secondary | ICD-10-CM | POA: Diagnosis not present

## 2017-07-31 DIAGNOSIS — N184 Chronic kidney disease, stage 4 (severe): Secondary | ICD-10-CM | POA: Diagnosis not present

## 2017-09-14 ENCOUNTER — Encounter: Payer: Self-pay | Admitting: Internal Medicine

## 2017-09-14 ENCOUNTER — Ambulatory Visit (INDEPENDENT_AMBULATORY_CARE_PROVIDER_SITE_OTHER): Payer: Medicare Other | Admitting: Internal Medicine

## 2017-09-14 VITALS — BP 154/70 | HR 56 | Temp 98.5°F | Ht 62.0 in | Wt 184.4 lb

## 2017-09-14 DIAGNOSIS — I10 Essential (primary) hypertension: Secondary | ICD-10-CM | POA: Diagnosis not present

## 2017-09-14 DIAGNOSIS — D72819 Decreased white blood cell count, unspecified: Secondary | ICD-10-CM

## 2017-09-14 DIAGNOSIS — R7303 Prediabetes: Secondary | ICD-10-CM

## 2017-09-14 DIAGNOSIS — N189 Chronic kidney disease, unspecified: Secondary | ICD-10-CM | POA: Diagnosis not present

## 2017-09-14 DIAGNOSIS — E039 Hypothyroidism, unspecified: Secondary | ICD-10-CM | POA: Diagnosis not present

## 2017-09-14 DIAGNOSIS — Q613 Polycystic kidney, unspecified: Secondary | ICD-10-CM | POA: Diagnosis not present

## 2017-09-14 DIAGNOSIS — I872 Venous insufficiency (chronic) (peripheral): Secondary | ICD-10-CM

## 2017-09-14 DIAGNOSIS — K635 Polyp of colon: Secondary | ICD-10-CM

## 2017-09-14 DIAGNOSIS — E559 Vitamin D deficiency, unspecified: Secondary | ICD-10-CM | POA: Diagnosis not present

## 2017-09-14 DIAGNOSIS — N183 Chronic kidney disease, stage 3 unspecified: Secondary | ICD-10-CM

## 2017-09-14 DIAGNOSIS — E785 Hyperlipidemia, unspecified: Secondary | ICD-10-CM | POA: Diagnosis not present

## 2017-09-14 DIAGNOSIS — I129 Hypertensive chronic kidney disease with stage 1 through stage 4 chronic kidney disease, or unspecified chronic kidney disease: Secondary | ICD-10-CM

## 2017-09-14 DIAGNOSIS — Z23 Encounter for immunization: Secondary | ICD-10-CM

## 2017-09-14 LAB — CBC WITH DIFFERENTIAL/PLATELET
BASOS PCT: 0.7 % (ref 0.0–3.0)
Basophils Absolute: 0 10*3/uL (ref 0.0–0.1)
EOS PCT: 0.8 % (ref 0.0–5.0)
Eosinophils Absolute: 0 10*3/uL (ref 0.0–0.7)
HEMATOCRIT: 33.8 % — AB (ref 36.0–46.0)
Hemoglobin: 11.1 g/dL — ABNORMAL LOW (ref 12.0–15.0)
Lymphocytes Relative: 33.1 % (ref 12.0–46.0)
Lymphs Abs: 1.1 10*3/uL (ref 0.7–4.0)
MCHC: 33 g/dL (ref 30.0–36.0)
MCV: 92 fl (ref 78.0–100.0)
MONOS PCT: 9.4 % (ref 3.0–12.0)
Monocytes Absolute: 0.3 10*3/uL (ref 0.1–1.0)
Neutro Abs: 1.9 10*3/uL (ref 1.4–7.7)
Neutrophils Relative %: 56 % (ref 43.0–77.0)
Platelets: 185 10*3/uL (ref 150.0–400.0)
RBC: 3.67 Mil/uL — AB (ref 3.87–5.11)
RDW: 13.6 % (ref 11.5–15.5)
WBC: 3.5 10*3/uL — AB (ref 4.0–10.5)

## 2017-09-14 LAB — HEMOGLOBIN A1C: HEMOGLOBIN A1C: 5.7 % (ref 4.6–6.5)

## 2017-09-14 LAB — BASIC METABOLIC PANEL
BUN: 43 mg/dL — ABNORMAL HIGH (ref 6–23)
CHLORIDE: 105 meq/L (ref 96–112)
CO2: 22 meq/L (ref 19–32)
Calcium: 8.7 mg/dL (ref 8.4–10.5)
Creatinine, Ser: 3.66 mg/dL — ABNORMAL HIGH (ref 0.40–1.20)
GFR: 15.68 mL/min — ABNORMAL LOW (ref >60.00)
Glucose, Bld: 77 mg/dL (ref 70–99)
POTASSIUM: 3.9 meq/L (ref 3.5–5.1)
Sodium: 137 mEq/L (ref 135–145)

## 2017-09-14 LAB — VITAMIN D 25 HYDROXY (VIT D DEFICIENCY, FRACTURES): VITD: 30.33 ng/mL (ref 30.00–100.00)

## 2017-09-14 LAB — TSH: TSH: 1.07 u[IU]/mL (ref 0.35–4.50)

## 2017-09-14 MED ORDER — HYDRALAZINE HCL 25 MG PO TABS
25.0000 mg | ORAL_TABLET | Freq: Two times a day (BID) | ORAL | 1 refills | Status: DC
Start: 2017-09-14 — End: 2018-02-16

## 2017-09-14 MED ORDER — CARVEDILOL 6.25 MG PO TABS: 6.2500 mg | ORAL_TABLET | Freq: Two times a day (BID) | ORAL | 1 refills | Status: DC

## 2017-09-14 NOTE — Addendum Note (Signed)
Addended by: Orland Mustard on: 09/14/2017 10:19 AM   Modules accepted: Orders

## 2017-09-14 NOTE — Patient Instructions (Signed)
Consider Zetia for cholesterol or a statin medication  Consider hepatitis B vaccine  Please schedule bone density test    Cholesterol Cholesterol is a white, waxy, fat-like substance that is needed by the human body in small amounts. The liver makes all the cholesterol we need. Cholesterol is carried from the liver by the blood through the blood vessels. Deposits of cholesterol (plaques) may build up on blood vessel (artery) walls. Plaques make the arteries narrower and stiffer. Cholesterol plaques increase the risk for heart attack and stroke. You cannot feel your cholesterol level even if it is very high. The only way to know that it is high is to have a blood test. Once you know your cholesterol levels, you should keep a record of the test results. Work with your health care provider to keep your levels in the desired range. What do the results mean?  Total cholesterol is a rough measure of all the cholesterol in your blood.  LDL (low-density lipoprotein) is the "bad" cholesterol. This is the type that causes plaque to build up on the artery walls. You want this level to be low.  HDL (high-density lipoprotein) is the "good" cholesterol because it cleans the arteries and carries the LDL away. You want this level to be high.  Triglycerides are fat that the body can either burn for energy or store. High levels are closely linked to heart disease. What are the desired levels of cholesterol?  Total cholesterol below 200.  LDL below 100 for people who are at risk, below 70 for people at very high risk.  HDL above 40 is good. A level of 60 or higher is considered to be protective against heart disease.  Triglycerides below 150. How can I lower my cholesterol? Diet Follow your diet program as told by your health care provider.  Choose fish or white meat chicken and [place], roasted or baked. Limit fatty cuts of red meat, fried foods, and processed meats, such as sausage and lunch  meats.  Eat lots of fresh fruits and vegetables.  Choose whole grains, beans, pasta, potatoes, and cereals.  Choose olive oil, corn oil, or canola oil, and use only small amounts.  Avoid butter, mayonnaise, shortening, or palm kernel oils.  Avoid foods with trans fats.  Drink skim or nonfat milk and eat low-fat or nonfat yogurt and cheeses. Avoid whole milk, cream, ice cream, egg yolks, and full-fat cheeses.  Healthier desserts include angel food cake, ginger snaps, animal crackers, hard candy, popsicles, and low-fat or nonfat frozen yogurt. Avoid pastries, cakes, pies, and cookies.  Exercise  Follow your exercise program as told by your health care provider. A regular program: ? Helps to decrease LDL and raise HDL. ? Helps with weight control.  Do things that increase your activity level, such as gardening, walking, and taking the stairs.  Ask your health care provider about ways that you can be more active in your daily life.  Medicine  Take over-the-counter and prescription medicines only as told by your health care provider. ? Medicine may be prescribed by your health care provider to help lower cholesterol and decrease the risk for heart disease. This is usually done if diet and exercise have failed to bring down cholesterol levels. ? If you have several risk factors, you may need medicine even if your levels are normal.  This information is not intended to replace advice given to you by your health care provider. Make sure you discuss any questions you have with your  health care provider. Document Released: 02/11/2001 Document Revised: 12/15/2015 Document Reviewed: 11/17/2015 Elsevier Interactive Patient Education  2018 [employer].  Zetia/Ezetimibe Tablets What is this medicine? EZETIMIBE (ez ET i mibe) blocks the absorption of cholesterol from the stomach. It can help lower blood cholesterol for patients who are at risk of getting heart disease or a stroke. It is only  for patients whose cholesterol level is not controlled by diet. This medicine may be used for other purposes; ask your health care provider or pharmacist if you have questions. COMMON BRAND NAME(S): Zetia What should I tell my health care provider before I take this medicine? They need to know if you have any of these conditions: -liver disease -an unusual or allergic reaction to ezetimibe, medicines, foods, dyes, or preservatives -pregnant or trying to get pregnant -breast-feeding How should I use this medicine? Take this medicine by mouth with a glass of water. Follow the directions on the prescription label. This medicine can be taken with or without food. Take your doses at regular intervals. Do not take your medicine more often than directed. Talk to your pediatrician regarding the use of this medicine in children. Special care may be needed. Overdosage: If you think you have taken too much of this medicine contact a poison control center or emergency room at once. NOTE: This medicine is only for you. Do not share this medicine with others. What if I miss a dose? If you miss a dose, take it as soon as you can. If it is almost time for your next dose, take only that dose. Do not take double or extra doses. What may interact with this medicine? Do not take this medicine with any of the following medications: -fenofibrate -gemfibrozil This medicine may also interact with the following medications: -antacids -cyclosporine -herbal medicines like red yeast rice -other medicines to lower cholesterol or triglycerides This list may not describe all possible interactions. Give your health care provider a list of all the medicines, herbs, non-prescription drugs, or dietary supplements you use. Also tell them if you smoke, drink alcohol, or use illegal drugs. Some items may interact with your medicine. What should I watch for while using this medicine? Visit your doctor or health care  professional for regular checks on your progress. You will need to have your cholesterol levels checked. If you are also taking some other cholesterol medicines, you will also need to have tests to make sure your liver is working properly. Tell your doctor or health care professional if you get any unexplained muscle pain, tenderness, or weakness, especially if you also have a fever and tiredness. You need to follow a low-cholesterol, low-fat diet while you are taking this medicine. This will decrease your risk of getting heart and blood vessel disease. Exercising and avoiding alcohol and smoking can also help. Ask your doctor or dietician for advice. What side effects may I notice from receiving this medicine? Side effects that you should report to your doctor or health care professional as soon as possible: -allergic reactions like skin rash, itching or hives, swelling of the face, lips, or tongue -dark yellow or brown urine -unusually weak or tired -yellowing of the skin or eyes Side effects that usually do not require medical attention (report to your doctor or health care professional if they continue or are bothersome): -diarrhea -dizziness -headache -stomach upset or pain This list may not describe all possible side effects. Call your doctor for medical advice about side effects. You may report  side effects to FDA at 1-800-FDA-1088. Where should I keep my medicine? Keep out of the reach of children. Store at room temperature between 15 and 30 degrees C (59 and 86 degrees F). Protect from moisture. Keep container tightly closed. Throw away any unused medicine after the expiration date. NOTE: This sheet is a summary. It may not cover all possible information. If you have questions about this medicine, talk to your doctor, pharmacist, or health care provider.  2018 Elsevier/Gold Standard (2011-11-24 15:39:09)

## 2017-09-14 NOTE — Progress Notes (Addendum)
Chief Complaint  Patient presents with  . Follow-up   Follow up  1. CKD pt today reports she does not want to pursue HD and per Dr. Florene Glen kidney #s improved she thinks related to supplements she was taking  2. HTN uncontrolled only taking Coreg 6.25 qd and hydralazine 25 qd though both dose bid. Stressed importance of compliance today. No meds taken as of yet. She thinks one is giving leg edema  3. HLD reviewed opts tx statin vs nonstatin pt wants to think about it   Review of Systems  Constitutional: Negative for malaise/fatigue and weight loss.  Eyes: Negative for blurred vision.  Cardiovascular: Positive for leg swelling. Negative for chest pain.  Gastrointestinal: Negative for abdominal pain.  Skin: Negative for rash.  Psychiatric/Behavioral: Negative for depression.   Past Medical History:  Diagnosis Date  . Abnormal Pap smear of cervix    mid 40s to 50s   . Allergy   . Anemia   . Brittle nails   . Chicken pox   . Chronic kidney disease    CKD 4/borderline 5   . Emphysema of lung (HCC)    chronic bronchitis   . Fibrocystic breast changes   . Glaucoma    [name] [name]  . Heart murmur   . Hyperlipidemia   . Hypertension   . Measles    childhood  . Secondary hyperparathyroidism ([place])   . Shoulder pain   . Thyroid disease    hypothyroidism    Past Surgical History:  Procedure Laterality Date  . ABDOMINAL HYSTERECTOMY     total 2/2 fibroids in [place] CYST REMOVAL  12/2012   Performed at [facility]  . UTERINE FIBROID SURGERY     In [place] several years ago  . VARICOSE VEIN SURGERY     left with h/o abnormal abi   . VEIN LIGATION AND STRIPPING Left    Performed in [place]   Family History  Problem Relation Age of Onset  . Hypertension Mother   . Diabetes Mother   . Arthritis Mother   . Cancer Mother   . Hypertension Father   . Cancer Father        colon, prostate  . Stroke Father        complications from HTN  . Alcohol abuse Brother   . COPD  Brother   . Depression Brother   . Drug abuse Brother   . Early death Brother   . Mental illness Daughter   . Diabetes Son   . Alcohol abuse Brother   . Drug abuse Brother   . Early death Brother   . Cystic kidney disease Other   . Kidney failure Cousin   . Sickle cell anemia Other    Social History   Socioeconomic History  . Marital status: Married    Spouse name: Not on file  . Number of children: Not on file  . Years of education: Not on file  . Highest education level: Not on file  Occupational History  . Not on file  Social Needs  . Financial resource strain: Not on file  . Food insecurity:    Worry: Not on file    Inability: Not on file  . Transportation needs:    Medical: Not on file    Non-medical: Not on file  Tobacco Use  . Smoking status: Never Smoker  . Smokeless tobacco: Never Used  Substance and Sexual Activity  . Alcohol use: No  Alcohol/week: 0.0 oz  . Drug use: No  . Sexual activity: Not Currently  Lifestyle  . Physical activity:    Days per week: Not on file    Minutes per session: Not on file  . Stress: Not on file  Relationships  . Social connections:    Talks on phone: Not on file    Gets together: Not on file    Attends religious service: Not on file    Active member of club or organization: Not on file    Attends meetings of clubs or organizations: Not on file    Relationship status: Not on file  . Intimate partner violence:    Fear of current or ex partner: Not on file    Emotionally abused: Not on file    Physically abused: Not on file    Forced sexual activity: Not on file  Other Topics Concern  . Not on file  Social History Narrative   Husband died 2017/05/28 [name]   4 kids (2 sons, 2 daughters)    Never smoker    Lived in [place] before coming to [place] moved 2014/2015    Retired 2011    [employer]    No outpatient medications have been marked as taking for the 09/14/17 encounter (Office Visit) with [name],  Nino Glow, MD.   Allergies  Allergen Reactions  . Citrus Itching and Swelling   No results found for this or any previous visit (from the past 2160 hour(s)). Objective  Body mass index is 33.73 kg/m. Wt Readings from Last 3 Encounters:  09/14/17 184 lb 6.4 oz (83.6 kg)  06/15/17 188 lb (85.3 kg)  10/01/16 201 lb (91.2 kg)   Temp Readings from Last 3 Encounters:  09/14/17 98.5 F (36.9 C) (Oral)  06/15/17 98 F (36.7 C) (Oral)  10/01/16 98 F (36.7 C) (Oral)   BP Readings from Last 3 Encounters:  09/14/17 (!) 154/70  06/15/17 138/82  10/01/16 (!) 164/84   Pulse Readings from Last 3 Encounters:  09/14/17 (!) 56  06/15/17 (!) 58  10/01/16 (!) 55    Physical Exam  Constitutional: She is oriented to person, place, and time. She appears well-developed and well-nourished.  HENT:  Head: Normocephalic and atraumatic.  Mouth/Throat: Oropharynx is clear and moist and mucous membranes are normal.  Eyes: Pupils are equal, round, and reactive to light. Conjunctivae are normal.  Cardiovascular: Normal rate, regular rhythm and normal heart sounds.  1+ leg edema b/l and varicosities   Pulmonary/Chest: Effort normal and breath sounds normal.  Neurological: She is alert and oriented to person, place, and time. Gait normal.  Skin: Skin is warm, dry and intact.  Varicose veins b/l lower ext   Psychiatric: She has a normal mood and affect. Her speech is normal and behavior is normal. Judgment and thought content normal. Cognition and memory are normal.  Nursing note and vitals reviewed.   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 HTN uncontrolled, noncompliant  3. HLD 4. Leg edema  5. HM 6. hypothyroidism Plan   1.  Check BMET today f/u renal  Stressed impt of controlled BP Saw Dr. Florene Glen 11/20/17 BP 159/83, BUN 48 Cr 3.92 increasing, GFR 12, H/H 11.0/33.0  2.  Cont meds Stressed compliance  3. Disc zetia vs statin today pt wants to think about it  Cholesterol handout given  today 4. Rx compression stockings 15-20 mmhg 5.  Declines flu shot  rec hep b vaccine not immune disc'ed today pt  wants to think about it  Tdap had 10/19/13 Dr. Heath Gold Will disc shingrix, pna 23 in future if has not had  prevnar given today  Declines further mammograms though last 11/27/15 [facility] with b/l asymmetries Last DEXA in [place] years ago agreeable to repeat ordered today  -rec pt schedule Pap out of age window  Colonoscopy 01/03/13 colon polyps will disc with pt If wants repeat in 12/2017  -referred colonoscopy today  Never smoker  [facility] Venetia Maxon yet   6. Off tirosint 50 repeat TSH today   Provider: Dr. [name] [name]-[facility]

## 2017-09-14 NOTE — Progress Notes (Signed)
Pre visit review using our clinic review tool, if applicable. No additional management support is needed unless otherwise documented below in the visit note. 

## 2017-09-21 ENCOUNTER — Telehealth: Payer: Self-pay | Admitting: Internal Medicine

## 2017-09-21 NOTE — Telephone Encounter (Signed)
Copied from [place] [phone]. Topic: Quick Communication - See Telephone Encounter >> Sep 21, 2017 10:10 AM [place], Laban Emperor wrote: CRM for notification. See Telephone encounter for: 09/21/17.  Pt calling for lab results. CRM is in for NT to give results

## 2017-09-21 NOTE — Telephone Encounter (Signed)
Pt given results and documented in result note 

## 2017-09-22 ENCOUNTER — Other Ambulatory Visit: Payer: Self-pay | Admitting: Internal Medicine

## 2017-09-22 DIAGNOSIS — D72819 Decreased white blood cell count, unspecified: Secondary | ICD-10-CM

## 2017-09-28 ENCOUNTER — Inpatient Hospital Stay: Payer: Medicare Other | Attending: Oncology | Admitting: Oncology

## 2017-11-05 DIAGNOSIS — H401121 Primary open-angle glaucoma, left eye, mild stage: Secondary | ICD-10-CM | POA: Diagnosis not present

## 2017-11-05 DIAGNOSIS — H401113 Primary open-angle glaucoma, right eye, severe stage: Secondary | ICD-10-CM | POA: Diagnosis not present

## 2017-11-10 ENCOUNTER — Telehealth: Payer: Self-pay | Admitting: Gastroenterology

## 2017-11-10 ENCOUNTER — Ambulatory Visit (INDEPENDENT_AMBULATORY_CARE_PROVIDER_SITE_OTHER): Payer: Medicare Other

## 2017-11-10 VITALS — BP 134/78 | HR 63 | Temp 98.2°F | Resp 14 | Ht 62.0 in | Wt 186.4 lb

## 2017-11-10 DIAGNOSIS — Z0000 Encounter for general adult medical examination without abnormal findings: Secondary | ICD-10-CM

## 2017-11-10 DIAGNOSIS — Z1211 Encounter for screening for malignant neoplasm of colon: Secondary | ICD-10-CM

## 2017-11-10 NOTE — Telephone Encounter (Signed)
Received referral for pt to have next colon at our office. Patients last colon was 12-31-2012 at [facility] and records are in care everywhere. Patient not requesting certain doc. DOD for referral date 6.11.19 is  Dr.Jacobs.

## 2017-11-10 NOTE — Patient Instructions (Addendum)
  Holly Mcgee , Thank you for taking time to come for your Medicare Wellness Visit. I appreciate your ongoing commitment to your health goals. Please review the following plan we discussed and let me know if I can assist you in the future.   [facility]  Schedule dentist appointment  These are the goals we discussed: Goals    . Increase physical activity     Join the gym and exercise with your daughter.        This is a list of the screening recommended for you and due dates:  Health Maintenance  Topic Date Due  . Colon Cancer Screening  09/06/1995  . DEXA scan (bone density measurement)  09/06/2010  . Flu Shot  01/22/2018*  . Mammogram  06/15/2018*  . Tetanus Vaccine  10/20/2023  .  Hepatitis C: One time screening is recommended by Center for Disease Control  (CDC) for  adults born from [birth year] through 1965.   Completed  . Pneumonia vaccines  Completed  *Topic was postponed. The date shown is not the original due date.  -+

## 2017-11-10 NOTE — Progress Notes (Signed)
Subjective:   Holly Mcgee is a 72 y.o. female who presents for an Initial Medicare Annual Wellness Visit.  Review of Systems    No ROS.  Medicare Wellness Visit. Additional risk factors are reflected in the social history.  Cardiac Risk Factors include: advanced age (>36men, >35 women);hypertension     Objective:    Today's Vitals   11/10/17 0909  BP: 134/78  Pulse: 63  Resp: 14  Temp: 98.2 F (36.8 C)  TempSrc: Oral  SpO2: 96%  Weight: 186 lb 6.4 oz (84.6 kg)  Height: 5\' 2"  (1.575 m)   Body mass index is 34.09 kg/m.  Advanced Directives 11/10/2017 10/01/2016 05/12/2016 03/14/2015  Does Patient Have a Medical Advance Directive? Yes No Yes Yes  Type of [employer] Power of [place];Living will  Does patient want to make changes to medical advance directive? No - Patient declined - - -  Copy of [place] in Chart? No - copy requested - - No - copy requested    Current Medications (verified) Outpatient Encounter Medications as of 11/10/2017  Medication Sig  . carvedilol (COREG) 6.25 MG tablet Take 1 tablet (6.25 mg total) by mouth 2 (two) times daily with a meal.  . dorzolamide-timolol (COSOPT) 22.3-6.8 MG/ML ophthalmic solution Place 1 drop into both eyes 2 (two) times daily.  . furosemide (LASIX) 80 MG tablet Take 80 mg by mouth 2 (two) times daily.  . hydrALAZINE (APRESOLINE) 25 MG tablet Take 1 tablet (25 mg total) by mouth 2 (two) times daily.  . Latanoprostene Bunod (VYZULTA) 0.024 % SOLN Apply 1 drop to eye at bedtime. Both eyes per [name]  . Multiple Vitamins-Minerals (MULTI-DAY PLUS MINERALS PO) Take by mouth daily.  . niacin 100 MG tablet Take 100 mg by mouth at bedtime.  . selenium 50 MCG TABS tablet Take 50 mcg by mouth daily.   No facility-administered encounter medications on file as of 11/10/2017.     Allergies (verified) Citrus    History: Past Medical History:  Diagnosis Date  . Abnormal Pap smear of cervix    mid 40s to 50s   . Allergy   . Anemia   . Brittle nails   . Chicken pox   . Chronic kidney disease    CKD 4/borderline 5   . Emphysema of lung (HCC)    chronic bronchitis   . Fibrocystic breast changes   . Glaucoma    [name] [name]  . Heart murmur   . Hyperlipidemia   . Hypertension   . Measles    childhood  . Secondary hyperparathyroidism ([place])   . Shoulder pain   . Thyroid disease    hypothyroidism    Past Surgical History:  Procedure Laterality Date  . ABDOMINAL HYSTERECTOMY     total 2/2 fibroids in [place] CYST REMOVAL  12/2012   Performed at [facility]  . UTERINE FIBROID SURGERY     In [place] several years ago  . VARICOSE VEIN SURGERY     left with h/o abnormal abi   . VEIN LIGATION AND STRIPPING Left    Performed in [place]   Family History  Problem Relation Age of Onset  . Hypertension Mother   . Diabetes Mother   . Arthritis Mother   . Cancer Mother   . Hypertension Father   . Cancer Father        colon,  prostate  . Stroke Father        complications from HTN  . Alcohol abuse Brother   . COPD Brother   . Depression Brother   . Drug abuse Brother   . Early death Brother   . Mental illness Daughter   . Diabetes Son   . Alcohol abuse Brother   . Drug abuse Brother   . Early death Brother   . Cystic kidney disease Other   . Kidney failure Cousin   . Sickle cell anemia Other    Social History   Socioeconomic History  . Marital status: Married    Spouse name: Not on file  . Number of children: Not on file  . Years of education: Not on file  . Highest education level: Not on file  Occupational History  . Not on file  Social Needs  . Financial resource strain: Not hard at all  . Food insecurity:    Worry: Never true    Inability: Never true  . Transportation needs:    Medical: No    Non-medical: No  Tobacco Use  . Smoking status: Never  Smoker  . Smokeless tobacco: Never Used  Substance and Sexual Activity  . Alcohol use: No    Alcohol/week: 0.0 oz  . Drug use: No  . Sexual activity: Not Currently  Lifestyle  . Physical activity:    Days per week: Not on file    Minutes per session: Not on file  . Stress: Not at all  Relationships  . Social connections:    Talks on phone: Not on file    Gets together: Not on file    Attends religious service: Not on file    Active member of club or organization: Not on file    Attends meetings of clubs or organizations: Not on file    Relationship status: Not on file  Other Topics Concern  . Not on file  Social History Narrative   Husband died 2017-05-21 [name]   4 kids (2 sons, 2 daughters)    Never smoker    Lived in [place] before coming to [place] moved 2014/2015    Retired 2011    [place] given: Not Answered   Clinical Intake:  Pre-visit preparation completed: Yes  Pain : No/denies pain     Nutritional Status: BMI > 30  Obese Diabetes: No  How often do you need to have someone help you when you read instructions, pamphlets, or other written materials from your doctor or pharmacy?: 1 - Never  Interpreter Needed?: No      Activities of Daily Living In your present state of health, do you have any difficulty performing the following activities: 11/10/2017  Hearing? N  Vision? N  Difficulty concentrating or making decisions? N  Walking or climbing stairs? N  Dressing or bathing? N  Doing errands, shopping? N  Preparing Food and eating ? N  Using the Toilet? N  In the past six months, have you accidently leaked urine? N  Do you have problems with loss of bowel control? N  Managing your Medications? N  Managing your Finances? N  Housekeeping or managing your Housekeeping? N  Some recent data might be hidden     Immunizations and Health Maintenance Immunization History  Administered Date(s) Administered  .  Pneumococcal Conjugate-13 09/14/2017  . Pneumococcal Polysaccharide-23 10/19/2013   Health Maintenance Due  Topic Date Due  . COLONOSCOPY  09/06/1995  .  DEXA SCAN  09/06/2010    Patient Care Team: [name], Nino Glow, MD as PCP - General (Internal Medicine)  Indicate any recent Medical Services you may have received from other than [facility] providers in the past year (date may be approximate).     Assessment:   This is a routine wellness examination for [facility].  The goal of the wellness visit is to assist the patient how to close the gaps in care and create a preventative care plan for the patient.   The roster of all physicians providing medical care to patient is listed in the Snapshot section of the chart.  Osteoporosis risk reviewed.    Safety issues reviewed; Smoke and carbon monoxide detectors in the home. No firearms in the home. Wears seatbelts when driving or riding with others. No violence in the home.  They do not have excessive sun exposure.  Discussed the need for sun protection: hats, long sleeves and the use of sunscreen if there is significant sun exposure.  Patient is alert, normal appearance, oriented to person/place/and time.  Correctly identified the president of the [place] and recalls of 2/3 words. Performs simple calculations and can read correct time from watch face. Displays appropriate judgement.  No new identified risk were noted.  No failures at ADL's or IADL's.    BMI- discussed the importance of a healthy diet, water intake and the benefits of aerobic exercise. She has a healthy diet, adequate water intake and plans to increase her exercise.  Educational material provided.   24 hour diet recall: Low carb diet, low sodium diet  Dental- every 12 months.  Eye- Visual acuity not assessed per patient preference since they have regular follow up with the ophthalmologist.  Wears corrective lenses.  Sleep patterns- Sleeps during the night without  issues. Wakes feeling rested. CPAP not in use.  Colonoscopy ordered.  Prefers [facility] in [place].  Request sent to referral coordinator.   Hearing/Vision screen Hearing Screening Comments: Patient is able to hear conversational tones without difficulty.  No issues reported.   Vision Screening Comments: Followed by [facility] Wears corrective lenses Glaucoma; drops in use Visits 6 months Visual acuity not assessed per patient preference since they have regular follow up with the ophthalmologist  Dietary issues and exercise activities discussed: Current Exercise Habits: The patient does not participate in regular exercise at present  Goals    . Increase physical activity     Join the gym and exercise with your daughter.       Depression Screen PHQ 2/9 Scores 09/14/2017 03/14/2015  PHQ - 2 Score 0 0    Fall Risk Fall Risk  09/14/2017 05/08/2016 03/14/2015  Falls in the past year? No No No  Comment - Emmi Telephone Survey: data to providers prior to load -   Cognitive Function: MMSE - Mini Mental State Exam 11/10/2017  Orientation to time 5  Orientation to Place 5  Registration 3  Attention/ Calculation 5  Recall 3  Language- name 2 objects 2  Language- repeat 1  Language- follow 3 step command 3  Language- read & follow direction 1  Write a sentence 1  Copy design 1  Total score 30      Screening Tests Health Maintenance  Topic Date Due  . COLONOSCOPY  09/06/1995  . DEXA SCAN  09/06/2010  . INFLUENZA VACCINE  01/22/2018 (Originally 12/31/2017)  . MAMMOGRAM  06/15/2018 (Originally 10/24/2017)  . TETANUS/TDAP  10/20/2023  . Hepatitis C Screening  Completed  .  PNA vac Low Risk Adult  Completed     Plan:    End of life planning; Advance aging; Advanced directives discussed. Copy of current HCPOA/Living Will requested.    I have personally reviewed and noted the following in the patient's chart:   . Medical and social history . Use of alcohol,  tobacco or illicit drugs  . Current medications and supplements . Functional ability and status . Nutritional status . Physical activity . Advanced directives . List of other physicians . Hospitalizations, surgeries, and ER visits in previous 12 months . Vitals . Screenings to include cognitive, depression, and falls . Referrals and appointments  In addition, I have reviewed and discussed with patient certain preventive protocols, quality metrics, and best practice recommendations. A written personalized care plan for preventive services as well as general preventive health recommendations were provided to patient.     Varney Biles, LPN   1/60/1093

## 2017-11-13 ENCOUNTER — Encounter: Payer: Self-pay | Admitting: Internal Medicine

## 2017-11-20 DIAGNOSIS — N185 Chronic kidney disease, stage 5: Secondary | ICD-10-CM | POA: Diagnosis not present

## 2017-11-20 DIAGNOSIS — Q613 Polycystic kidney, unspecified: Secondary | ICD-10-CM | POA: Diagnosis not present

## 2017-11-20 DIAGNOSIS — I120 Hypertensive chronic kidney disease with stage 5 chronic kidney disease or end stage renal disease: Secondary | ICD-10-CM | POA: Diagnosis not present

## 2017-12-07 DIAGNOSIS — H401121 Primary open-angle glaucoma, left eye, mild stage: Secondary | ICD-10-CM | POA: Diagnosis not present

## 2017-12-07 DIAGNOSIS — H401113 Primary open-angle glaucoma, right eye, severe stage: Secondary | ICD-10-CM | POA: Diagnosis not present

## 2018-01-18 ENCOUNTER — Encounter: Payer: Self-pay | Admitting: Emergency Medicine

## 2018-01-18 ENCOUNTER — Other Ambulatory Visit: Payer: Self-pay

## 2018-01-18 ENCOUNTER — Emergency Department
Admission: EM | Admit: 2018-01-18 | Discharge: 2018-01-18 | Disposition: A | Discharge status: Home / Self Care | Payer: Medicare Other | Attending: Emergency Medicine | Admitting: Emergency Medicine

## 2018-01-18 DIAGNOSIS — N184 Chronic kidney disease, stage 4 (severe): Secondary | ICD-10-CM | POA: Insufficient documentation

## 2018-01-18 DIAGNOSIS — M542 Cervicalgia: Secondary | ICD-10-CM | POA: Diagnosis not present

## 2018-01-18 DIAGNOSIS — I129 Hypertensive chronic kidney disease with stage 1 through stage 4 chronic kidney disease, or unspecified chronic kidney disease: Secondary | ICD-10-CM | POA: Diagnosis not present

## 2018-01-18 DIAGNOSIS — E039 Hypothyroidism, unspecified: Secondary | ICD-10-CM | POA: Insufficient documentation

## 2018-01-18 DIAGNOSIS — E785 Hyperlipidemia, unspecified: Secondary | ICD-10-CM | POA: Diagnosis not present

## 2018-01-18 DIAGNOSIS — R51 Headache: Secondary | ICD-10-CM | POA: Diagnosis not present

## 2018-01-18 DIAGNOSIS — R519 Headache, unspecified: Secondary | ICD-10-CM

## 2018-01-18 DIAGNOSIS — I10 Essential (primary) hypertension: Secondary | ICD-10-CM | POA: Diagnosis not present

## 2018-01-18 DIAGNOSIS — J069 Acute upper respiratory infection, unspecified: Secondary | ICD-10-CM | POA: Diagnosis not present

## 2018-01-18 DIAGNOSIS — Z79899 Other long term (current) drug therapy: Secondary | ICD-10-CM | POA: Diagnosis not present

## 2018-01-18 LAB — URINALYSIS, COMPLETE (UACMP) WITH MICROSCOPIC
Bilirubin Urine: NEGATIVE
GLUCOSE, UA: NEGATIVE mg/dL
Hgb urine dipstick: NEGATIVE
KETONES UR: NEGATIVE mg/dL
Leukocytes, UA: NEGATIVE
Nitrite: NEGATIVE
PH: 6 (ref 5.0–8.0)
Protein, ur: 30 mg/dL — AB
SPECIFIC GRAVITY, URINE: 1.01 (ref 1.005–1.030)

## 2018-01-18 LAB — CBC
HCT: 34.2 % — ABNORMAL LOW (ref 35.0–47.0)
Hemoglobin: 11.7 g/dL — ABNORMAL LOW (ref 12.0–16.0)
MCH: 30.9 pg (ref 26.0–34.0)
MCHC: 34.2 g/dL (ref 32.0–36.0)
MCV: 90.4 fL (ref 80.0–100.0)
PLATELETS: 186 10*3/uL (ref 150–440)
RBC: 3.78 MIL/uL — AB (ref 3.80–5.20)
RDW: 14.2 % (ref 11.5–14.5)
WBC: 5.2 10*3/uL (ref 3.6–11.0)

## 2018-01-18 LAB — BASIC METABOLIC PANEL
Anion gap: 10 (ref 5–15)
BUN: 52 mg/dL — AB (ref 8–23)
CALCIUM: 9 mg/dL (ref 8.9–10.3)
CO2: 22 mmol/L (ref 22–32)
CREATININE: 3.75 mg/dL — AB (ref 0.44–1.00)
Chloride: 108 mmol/L (ref 98–111)
GFR calc non Af Amer: 11 mL/min — ABNORMAL LOW (ref >60)
GFR, EST AFRICAN AMERICAN: 13 mL/min — AB (ref >60)
Glucose, Bld: 94 mg/dL (ref 70–99)
Potassium: 4 mmol/L (ref 3.5–5.1)
SODIUM: 140 mmol/L (ref 135–145)

## 2018-01-18 MED ORDER — OXYMETAZOLINE HCL 0.05 % NA SOLN
1.0000 | Freq: Once | NASAL | Status: AC
Start: 2018-01-18 — End: 2018-01-18
  Administered 2018-01-18: 1 via NASAL
  Filled 2018-01-18: qty 15

## 2018-01-18 NOTE — ED Triage Notes (Signed)
Pt presents with headache, elevated bp, left neck pain, back pain. She came from [facility], who sent her due to prolonged QT interval on the EKG. Pt denies weakness on either side of her body, neuro deficits. Pt alert & oriented with NAD noted.

## 2018-01-18 NOTE — ED Triage Notes (Signed)
First Nurse Note:  Arrives from [facility] for evaluation.  Patient presented with elevated BP, left sided headache and neck pain.  An EKG was done at Urgent Care, which showed a prolonged QT interval (Per PA report).

## 2018-01-18 NOTE — Discharge Instructions (Addendum)
As we discussed please use your nasal spray twice daily for the next 3 days and then discard the spray.  Do not use longer than 3 days.  Use Tylenol up to every 6 hours as needed, as written on the box.  Please follow-up with your doctor in the next 2 days for recheck/reevaluation.

## 2018-01-18 NOTE — ED Provider Notes (Signed)
[facility] Emergency Department Provider Note  Time seen: 1:05 PM  I have reviewed the triage vital signs and the nursing notes.   HISTORY  Chief Complaint Headache    HPI Holly Mcgee is a 72 y.o. female with a past medical history of anemia, CKD, hypertension, hyperlipidemia, presents to the emergency department for headache and congestion.  According to the patient for the past 2 days she has been experiencing congestion and mucus in her throat and nose.  States over the past 2 days she is also had some stiffness in her neck and will intermittently get headaches in her forehead which she describes as sharp pains.  Patient went to urgent care today for evaluation however while at urgent care she was told she had an abnormal EKG and they sent her to the emergency department for evaluation.  Patient denies any chest pain or trouble breathing.  [name] EKG in the emergency department is normal.  [name] symptoms are suggestive of upper respiratory infection or sinusitis.  Patient denies any fever.  Denies any headache currently but states intermittent sharp pains in her head.     Past Medical History:  Diagnosis Date  . Abnormal Pap smear of cervix    mid 40s to 50s   . Allergy   . Anemia   . Brittle nails   . Chicken pox   . Chronic kidney disease    CKD 4/borderline 5   . Emphysema of lung (HCC)    chronic bronchitis   . Fibrocystic breast changes   . Glaucoma    [name] [name]  . Heart murmur   . Hyperlipidemia   . Hypertension   . Measles    childhood  . Secondary hyperparathyroidism ([place])   . Shoulder pain   . Thyroid disease    hypothyroidism     Patient Active Problem List   Diagnosis Date Noted  . Grief 05/25/2017  . Bradycardia 05/25/2017  . Chronic venous insufficiency 05/12/2016  . Lymphedema 05/12/2016  . Venous stasis dermatitis of right lower extremity 05/12/2016  . Hypertension goal BP (blood pressure) < 140/90  03/14/2015  . Allergic rhinitis with postnasal drip 03/14/2015  . Dysuria 03/14/2015  . Glaucoma 03/14/2015  . Polycystic kidney disease 03/14/2015  . 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 Hypothyroidism 03/14/2015  . Hyperlipidemia LDL goal <100 03/14/2015  . Elevated fasting glucose 03/14/2015  . Influenza vaccination declined by patient 03/14/2015    Past Surgical History:  Procedure Laterality Date  . ABDOMINAL HYSTERECTOMY     total 2/2 fibroids in [place] CYST REMOVAL  12/2012   Performed at [facility]  . UTERINE FIBROID SURGERY     In [place] several years ago  . VARICOSE VEIN SURGERY     left with h/o abnormal abi   . VEIN LIGATION AND STRIPPING Left    Performed in [place]    Prior to Admission medications   Medication Sig Start Date End Date Taking? Authorizing Provider  carvedilol (COREG) 6.25 MG tablet Take 1 tablet (6.25 mg total) by mouth 2 (two) times daily with a meal. 09/14/17  Yes [name], Nino Glow, MD  dorzolamide-timolol (COSOPT) 22.3-6.8 MG/ML ophthalmic solution Place 1 drop into both eyes 2 (two) times daily.   Yes [provider]  furosemide (LASIX) 80 MG tablet Take 80 mg by mouth 2 (two) times daily.   Yes [provider]  Latanoprostene Bunod (VYZULTA)  0.024 % SOLN Apply 1 drop to eye at bedtime. Both eyes per [name] 06/01/17  Yes [name], Nino Glow, MD  Multiple Vitamins-Minerals (MULTI-DAY PLUS MINERALS PO) Take 1 tablet by mouth daily.    Yes [provider]  niacin 100 MG tablet Take 100 mg by mouth at bedtime.   Yes [provider]  selenium 50 MCG TABS tablet Take 50 mcg by mouth daily.   Yes [provider]  hydrALAZINE (APRESOLINE) 25 MG tablet Take 1 tablet (25 mg total) by mouth 2 (two) times daily. Patient not taking: Reported on 01/18/2018 09/14/17   [name], Nino Glow, MD    Allergies  Allergen Reactions  . Citrus Itching and  Swelling    Family History  Problem Relation Age of Onset  . Hypertension Mother   . Diabetes Mother   . Arthritis Mother   . Cancer Mother   . Hypertension Father   . Cancer Father        colon, prostate  . Stroke Father        complications from HTN  . Alcohol abuse Brother   . COPD Brother   . Depression Brother   . Drug abuse Brother   . Early death Brother   . Mental illness Daughter   . Diabetes Son   . Alcohol abuse Brother   . Drug abuse Brother   . Early death Brother   . Cystic kidney disease Other   . Kidney failure Cousin   . Sickle cell anemia Other     Social History Social History   Tobacco Use  . Smoking status: Never Smoker  . Smokeless tobacco: Never Used  Substance Use Topics  . Alcohol use: No    Alcohol/week: 0.0 standard drinks  . Drug use: No    Review of Systems Constitutional: Negative for fever. Eyes: Negative for visual complaints ENT: Positive for nasal throat congestion Cardiovascular: Negative for chest pain. Respiratory: Negative for shortness of breath.  Intermittent cough Gastrointestinal: Negative for abdominal pain, vomiting and diarrhea. Genitourinary: Negative for urinary compaints Musculoskeletal: Mild intermittent neck pain, left-sided Skin: Negative for skin complaints  Neurological: Intermittent sharp headaches All other ROS negative  ____________________________________________   PHYSICAL EXAM:  VITAL SIGNS: ED Triage Vitals  Enc Vitals Group     BP 01/18/18 1040 (!) 185/70     Pulse Rate 01/18/18 1040 (!) 54     Resp 01/18/18 1040 18     Temp 01/18/18 1040 98 F (36.7 C)     Temp Source 01/18/18 1040 Oral     SpO2 01/18/18 1040 100 %     Weight 01/18/18 1041 185 lb (83.9 kg)     Height 01/18/18 1041 5\' 3"  (1.6 m)     Head Circumference --      Peak Flow --      Pain Score 01/18/18 1041 5     Pain Loc --      Pain Edu? --      Excl. in [place]? --    Constitutional: Alert and oriented. Well appearing  and in no distress. Eyes: Normal exam ENT   Head: Normocephalic and atraumatic   Mouth/Throat: Mucous membranes are moist. Cardiovascular: Normal rate, regular rhythm. No murmur Respiratory: Normal respiratory effort without tachypnea nor retractions. Breath sounds are clear Gastrointestinal: Soft and nontender. No distention.  Musculoskeletal: Nontender with normal range of motion in all extremities.  Neurologic:  Normal speech and language. No gross focal neurologic deficits  Skin:  Skin is warm, dry and intact.  Psychiatric: Mood and affect are normal.   ____________________________________________    EKG  EKG reviewed and interpreted by myself shows sinus bradycardia 55 bpm with a narrow QRS, normal axis, normal intervals, nonspecific ST changes without ST elevation.  ____________________________________________    INITIAL IMPRESSION / ASSESSMENT AND PLAN / ED COURSE  Pertinent labs & imaging results that were available during my care of the patient were reviewed by me and considered in my medical decision making (see chart for details).  Patient presents to the emergency department for intermittent headache was told she had an abnormal EKG also with nasal and throat congestion.  Differential would include upper respiratory infection, sinusitis, hypertension.  Patient does have moderately high blood pressure currently although not concerningly high.  Her labs show elevated creatinine although this is largely unchanged from her baseline lab values.  Normal white blood cell count.  Patient symptoms are very suggestive of congestion/URI with possible sinusitis or sinus headache.  I discussed with the patient using Tylenol as needed as well as Afrin twice daily for the next 3 days.  Patient agreeable to this plan of care.  Main reason patient was sent to the emergency department as for prolonged QT on her EKG.  [name] current EKG has a QTC of 436 well within normal limits.   Patient reassured by the work-up in the emergency department and will follow up with her doctor regarding her blood pressure.    ____________________________________________   FINAL CLINICAL IMPRESSION(S) / ED DIAGNOSES  Headache Upper respiratory infection    Harvest Dark, MD 01/18/18 1309

## 2018-01-25 ENCOUNTER — Ambulatory Visit: Payer: BLUE CROSS/BLUE SHIELD | Admitting: Internal Medicine

## 2018-01-25 DIAGNOSIS — Z0289 Encounter for other administrative examinations: Secondary | ICD-10-CM

## 2018-02-16 ENCOUNTER — Encounter: Payer: Self-pay | Admitting: Internal Medicine

## 2018-02-16 ENCOUNTER — Ambulatory Visit (INDEPENDENT_AMBULATORY_CARE_PROVIDER_SITE_OTHER): Payer: Medicare Other | Admitting: Internal Medicine

## 2018-02-16 VITALS — BP 130/80 | HR 61 | Temp 98.4°F | Ht 63.0 in | Wt 186.0 lb

## 2018-02-16 DIAGNOSIS — Z9114 Patient's other noncompliance with medication regimen: Secondary | ICD-10-CM

## 2018-02-16 DIAGNOSIS — E785 Hyperlipidemia, unspecified: Secondary | ICD-10-CM

## 2018-02-16 DIAGNOSIS — Z0184 Encounter for antibody response examination: Secondary | ICD-10-CM

## 2018-02-16 DIAGNOSIS — Q613 Polycystic kidney, unspecified: Secondary | ICD-10-CM

## 2018-02-16 DIAGNOSIS — Z1159 Encounter for screening for other viral diseases: Secondary | ICD-10-CM | POA: Diagnosis not present

## 2018-02-16 DIAGNOSIS — N185 Chronic kidney disease, stage 5: Secondary | ICD-10-CM | POA: Diagnosis not present

## 2018-02-16 DIAGNOSIS — I10 Essential (primary) hypertension: Secondary | ICD-10-CM

## 2018-02-16 DIAGNOSIS — E039 Hypothyroidism, unspecified: Secondary | ICD-10-CM | POA: Diagnosis not present

## 2018-02-16 MED ORDER — CARVEDILOL 6.25 MG PO TABS: 6.2500 mg | ORAL_TABLET | Freq: Two times a day (BID) | ORAL | 3 refills | Status: DC

## 2018-02-16 MED ORDER — HYDRALAZINE HCL 25 MG PO TABS: 25.0000 mg | ORAL_TABLET | Freq: Two times a day (BID) | ORAL | 3 refills | Status: DC

## 2018-02-16 NOTE — Progress Notes (Signed)
Pre visit review using our clinic review tool, if applicable. No additional management support is needed unless otherwise documented below in the visit note. 

## 2018-02-16 NOTE — Progress Notes (Addendum)
Chief Complaint  Patient presents with  . Follow-up   F/u  1. HTN controlled today though pt is noncompliant and intermittently taking meds coreg 6.25 mg bid, hydralazine 25 bid encouraged compliance  2. 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 Florene Glen she reports is leaving. She is taking lasix Rx 80 mg bid but she only takes 80 mg prn qd. I would like her to reduce dose due to declining GFR and will ask renal to weigh in at next g/u  3. Colonoscopy pending and not yet scheduled  Review of Systems  Constitutional: Negative for weight loss.  HENT: Negative for hearing loss and sinus pain.   Eyes: Negative for blurred vision.  Respiratory: Negative for shortness of breath.   Cardiovascular: Positive for leg swelling.  Gastrointestinal: Negative for abdominal pain.  Musculoskeletal: Negative for falls.  Skin: Negative for rash.  Neurological: Negative for headaches.  Psychiatric/Behavioral: Negative for depression.   Past Medical History:  Diagnosis Date  . Abnormal Pap smear of cervix    mid 40s to 50s   . Allergy   . Anemia   . Brittle nails   . Chicken pox   . Chronic kidney disease    CKD 4/borderline 5   . Emphysema of lung (HCC)    chronic bronchitis   . Fibrocystic breast changes   . Glaucoma    [name] [name]  . Heart murmur   . Hyperlipidemia   . Hypertension   . Measles    childhood  . Secondary hyperparathyroidism ([place])   . Shoulder pain   . Thyroid disease    hypothyroidism    Past Surgical History:  Procedure Laterality Date  . ABDOMINAL HYSTERECTOMY     total 2/2 fibroids in [place] CYST REMOVAL  12/2012   Performed at [facility]  . UTERINE FIBROID SURGERY     In [place] several years ago  . VARICOSE VEIN SURGERY     left with h/o abnormal abi   . VEIN LIGATION AND STRIPPING Left    Performed in [place]   Family History  Problem Relation Age of Onset  . Hypertension Mother   .  Diabetes Mother   . Arthritis Mother   . Cancer Mother   . Hypertension Father   . Cancer Father        colon, prostate  . Stroke Father        complications from HTN  . Alcohol abuse Brother   . COPD Brother   . Depression Brother   . Drug abuse Brother   . Early death Brother   . Mental illness Daughter   . Diabetes Son   . Alcohol abuse Brother   . Drug abuse Brother   . Early death Brother   . Cystic kidney disease Other   . Kidney failure Cousin   . Sickle cell anemia Other    Social History   Socioeconomic History  . Marital status: Married    Spouse name: Not on file  . Number of children: Not on file  . Years of education: Not on file  . Highest education level: Not on file  Occupational History  . Not on file  Social Needs  . Financial resource strain: Not hard at all  . Food insecurity:    Worry: Never true    Inability: Never true  . Transportation needs:  Medical: No    Non-medical: No  Tobacco Use  . Smoking status: Never Smoker  . Smokeless tobacco: Never Used  Substance and Sexual Activity  . Alcohol use: No    Alcohol/week: 0.0 standard drinks  . Drug use: No  . Sexual activity: Not Currently  Lifestyle  . Physical activity:    Days per week: Not on file    Minutes per session: Not on file  . Stress: Not at all  Relationships  . Social connections:    Talks on phone: Not on file    Gets together: Not on file    Attends religious service: Not on file    Active member of club or organization: Not on file    Attends meetings of clubs or organizations: Not on file    Relationship status: Not on file  . Intimate partner violence:    Fear of current or ex partner: Not on file    Emotionally abused: Not on file    Physically abused: Not on file    Forced sexual activity: Not on file  Other Topics Concern  . Not on file  Social History Narrative   Husband died 2017-05-21 [name]   4 kids (2 sons, 2 daughters)    Never smoker    Lived in  [place] before coming to [place] moved 2014/2015    Retired 2011    [employer]    Current Meds  Medication Sig  . carvedilol (COREG) 6.25 MG tablet Take 1 tablet (6.25 mg total) by mouth 2 (two) times daily with a meal.  . dorzolamide-timolol (COSOPT) 22.3-6.8 MG/ML ophthalmic solution Place 1 drop into both eyes 2 (two) times daily.  . furosemide (LASIX) 80 MG tablet Take 80 mg by mouth 2 (two) times daily.  . hydrALAZINE (APRESOLINE) 25 MG tablet Take 1 tablet (25 mg total) by mouth 2 (two) times daily.  . Latanoprostene Bunod (VYZULTA) 0.024 % SOLN Apply 1 drop to eye at bedtime. Both eyes per [name]  . Multiple Vitamins-Minerals (MULTI-DAY PLUS MINERALS PO) Take 1 tablet by mouth daily.   . niacin 100 MG tablet Take 100 mg by mouth at bedtime.  . selenium 50 MCG TABS tablet Take 50 mcg by mouth daily.   Allergies  Allergen Reactions  . Citrus Itching and Swelling   Recent Results (from the past 2160 hour(s))  Basic metabolic panel     Status: Abnormal   Collection Time: 01/18/18 10:48 AM  Result Value Ref Range   Sodium 140 135 - 145 mmol/L   Potassium 4.0 3.5 - 5.1 mmol/L   Chloride 108 98 - 111 mmol/L   CO2 22 22 - 32 mmol/L   Glucose, Bld 94 70 - 99 mg/dL   BUN 52 (H) 8 - 23 mg/dL   Creatinine, Ser 3.75 (H) 0.44 - 1.00 mg/dL   Calcium 9.0 8.9 - 10.3 mg/dL   GFR calc non Af Amer 11 (L) >60 mL/min   GFR calc Af Amer 13 (L) >60 mL/min    Comment: (NOTE) The eGFR has been calculated using the CKD EPI equation. This calculation has not been validated in all clinical situations. eGFR's persistently <60 mL/min signify possible Chronic Kidney Disease.    Anion gap 10 5 - 15    Comment: Performed at [facility], [address] [place], [place] [address]  CBC     Status: Abnormal   Collection Time: 01/18/18 10:48 AM  Result Value Ref Range  WBC 5.2 3.6 - 11.0 K/uL   RBC 3.78 (L) 3.80 - 5.20 MIL/uL   Hemoglobin 11.7 (L) 12.0 - 16.0 g/dL   HCT  34.2 (L) 35.0 - 47.0 %   MCV 90.4 80.0 - 100.0 fL   MCH 30.9 26.0 - 34.0 pg   MCHC 34.2 32.0 - 36.0 g/dL   RDW 14.2 11.5 - 14.5 %   Platelets 186 150 - 440 K/uL    Comment: Performed at [facility], [address] [place], [place] [address]  Urinalysis, Complete w Microscopic     Status: Abnormal   Collection Time: 01/18/18 10:48 AM  Result Value Ref Range   Color, Urine YELLOW (A) YELLOW   APPearance HAZY (A) CLEAR   Specific Gravity, Urine 1.010 1.005 - 1.030   pH 6.0 5.0 - 8.0   Glucose, UA NEGATIVE NEGATIVE mg/dL   Hgb urine dipstick NEGATIVE NEGATIVE   Bilirubin Urine NEGATIVE NEGATIVE   Ketones, ur NEGATIVE NEGATIVE mg/dL   Protein, ur 30 (A) NEGATIVE mg/dL   Nitrite NEGATIVE NEGATIVE   Leukocytes, UA NEGATIVE NEGATIVE   RBC / HPF 0-5 0 - 5 RBC/hpf   WBC, UA 11-20 0 - 5 WBC/hpf   Bacteria, UA FEW (A) NONE SEEN   Squamous Epithelial / LPF 0-5 0 - 5   Mucus PRESENT     Comment: Performed at [facility], [address] [place], [place] [address]   Objective  Body mass index is 32.95 kg/m. Wt Readings from Last 3 Encounters:  02/16/18 186 lb (84.4 kg)  01/18/18 185 lb (83.9 kg)  11/10/17 186 lb 6.4 oz (84.6 kg)   Temp Readings from Last 3 Encounters:  02/16/18 98.4 F (36.9 C) (Oral)  01/18/18 98 F (36.7 C) (Oral)  11/10/17 98.2 F (36.8 C) (Oral)   BP Readings from Last 3 Encounters:  02/16/18 130/80  01/18/18 (!) 165/82  11/10/17 134/78   Pulse Readings from Last 3 Encounters:  02/16/18 61  01/18/18 (!) 52  11/10/17 63    Physical Exam  Constitutional: She is oriented to person, place, and time. Vital signs are normal. She appears well-developed and well-nourished. She is cooperative.  HENT:  Head: Normocephalic and atraumatic.  Mouth/Throat: Oropharynx is clear and moist and mucous membranes are normal.  Eyes: Pupils are equal, round, and reactive to light. Conjunctivae are normal.  Cardiovascular: Normal rate, regular rhythm  and normal heart sounds.  1+ leg edema b/l today with varicosities feet   Pulmonary/Chest: Effort normal and breath sounds normal.  Neurological: She is alert and oriented to person, place, and time. Gait normal.  Skin: Skin is warm, dry and intact.  Psychiatric: She has a normal mood and affect. Her speech is normal and behavior is normal. Judgment and thought content normal. Cognition and memory are normal.  Nursing note and vitals reviewed.   Assessment   1. HTN controlled today but pt noncompliant and intermittently takes meds it was 185/70 in ED 01/18/18  HLD 2. CKD 2/2 uncontrolled HTN and PKD progressing now stage 5 GFR last 12-13 Creatinine rising  3. HM 4. H/o hypothyroidism not taking Tirosint with TSH normal range  Plan   1. Encouraged compliance with medications bid refilled today  Pt declines statin  Disc red yeast rice vs zetia prefers red yeast and disc no outcome benefit with either of above only statin today 2. F/u renal CC note weigh in on lasix dose further recs  Reduce lasix to 40 mg qd  prn due to decline in kidney function  Saw kidney MD 02/22/18 Dr. 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  Declines flu shot  rec hep b vaccine not immune disc'ed prev pt wants to think about it  Tdap had 10/19/13 Dr. Heath Gold Will disc shingrix in future Prevnar had 09/14/17  pna 23 10/19/13   Declines further mammograms though last 11/27/15 [facility] [place] with b/l asymmetries Last DEX[place] in [place] agreeable to repeat ordered today  -rec pt schedule Pap out of age window  Colonoscopy 01/03/13 colon polyps will disc with pt If wants repeat in 12/2017  -referred colonoscopy prev pt to schedule will call  Never smoker [facility] Venetia Maxon yet  4. Repeat TSH with labs upcoming   Renal appt 05/19/18 Dr. Pearson Grippe CKD 5 pt wants to do conservative care as long as possible  BP 160/87 on BB and lasix   Provider: Dr. [name] [name]

## 2018-02-16 NOTE — Patient Instructions (Addendum)
Cut back lasix to 40 mg, 1/2 80 mg tablet daily as needed  Red yeast rice 600 mg 2x per day  Contact about colonoscopy  Dr. Jimmy Footman, Dr. Thea Silversmith other kidney doctors at [facility]    Cholesterol Cholesterol is a white, waxy, fat-like substance that is needed by the human body in small amounts. The liver makes all the cholesterol we need. Cholesterol is carried from the liver by the blood through the blood vessels. Deposits of cholesterol (plaques) may build up on blood vessel (artery) walls. Plaques make the arteries narrower and stiffer. Cholesterol plaques increase the risk for heart attack and stroke. You cannot feel your cholesterol level even if it is very high. The only way to know that it is high is to have a blood test. Once you know your cholesterol levels, you should keep a record of the test results. Work with your health care provider to keep your levels in the desired range. What do the results mean?  Total cholesterol is a rough measure of all the cholesterol in your blood.  LDL (low-density lipoprotein) is the "bad" cholesterol. This is the type that causes plaque to build up on the artery walls. You want this level to be low.  HDL (high-density lipoprotein) is the "good" cholesterol because it cleans the arteries and carries the LDL away. You want this level to be high.  Triglycerides are fat that the body can either burn for energy or store. High levels are closely linked to heart disease. What are the desired levels of cholesterol?  Total cholesterol below 200.  LDL below 100 for people who are at risk, below 70 for people at very high risk.  HDL above 40 is good. A level of 60 or higher is considered to be protective against heart disease.  Triglycerides below 150. How can I lower my cholesterol? Diet Follow your diet program as told by your health care provider.  Choose fish or white meat chicken and [place], roasted or baked. Limit fatty cuts of red  meat, fried foods, and processed meats, such as sausage and lunch meats.  Eat lots of fresh fruits and vegetables.  Choose whole grains, beans, pasta, potatoes, and cereals.  Choose olive oil, corn oil, or canola oil, and use only small amounts.  Avoid butter, mayonnaise, shortening, or palm kernel oils.  Avoid foods with trans fats.  Drink skim or nonfat milk and eat low-fat or nonfat yogurt and cheeses. Avoid whole milk, cream, ice cream, egg yolks, and full-fat cheeses.  Healthier desserts include angel food cake, ginger snaps, animal crackers, hard candy, popsicles, and low-fat or nonfat frozen yogurt. Avoid pastries, cakes, pies, and cookies.  Exercise  Follow your exercise program as told by your health care provider. A regular program: ? Helps to decrease LDL and raise HDL. ? Helps with weight control.  Do things that increase your activity level, such as gardening, walking, and taking the stairs.  Ask your health care provider about ways that you can be more active in your daily life.  Medicine  Take over-the-counter and prescription medicines only as told by your health care provider. ? Medicine may be prescribed by your health care provider to help lower cholesterol and decrease the risk for heart disease. This is usually done if diet and exercise have failed to bring down cholesterol levels. ? If you have several risk factors, you may need medicine even if your levels are normal.  This information is not intended to replace  advice given to you by your health care provider. Make sure you discuss any questions you have with your health care provider. Document Released: 02/11/2001 Document Revised: 12/15/2015 Document Reviewed: 11/17/2015 Elsevier Interactive Patient Education  2018 [place].   Chronic Kidney Disease, Adult Chronic kidney disease (CKD) occurs when the kidneys become damaged slowly over a long period of time. The kidneys are a pair of organs that do  many important jobs in the body, including:  Removing waste and extra fluid from the blood to make urine.  Making hormones that maintain the amount of fluid in tissues and blood vessels.  Maintaining the right amount of fluids and chemicals in the body.  A small amount of kidney damage may not cause problems, but a large amount of damage may make it hard or impossible for the kidneys to work the way they should. If steps are not taken to slow down kidney damage or to stop it from getting worse, the kidneys may stop working permanently (end-stage renal disease or ESRD). Most of the time, CKD does not go away, but it can often be controlled. People who have CKD are usually able to live normal lives. What are the causes? The most common causes of this condition are diabetes and high blood pressure (hypertension). Other causes include:  Heart and blood vessel (cardiovascular) disease.  Kidney diseases, such as: ? Glomerulonephritis. ? Interstitial nephritis. ? Polycystic kidney disease. ? Renal vascular disease.  Diseases that affect the immune system.  Genetic diseases.  Medicines that damage the kidneys, such as anti-inflammatory medicines.  Being around or being in contact with poisonous (toxic) substances.  A kidney or urinary infection that occurs again and again (recurs).  Vasculitis. This is swelling or inflammation of the blood vessels.  A problem with urine flow that may be caused by: ? Cancer. ? Having kidney stones more than one time. ? An enlarged prostate, in males.  What increases the risk? You are more likely to develop this condition if you:  Are older than age 31.  Are female.  Are African-American, Hispanic, Asian, Kim, or American [place].  Are a current or former smoker.  Are obese.  Have a family history of kidney disease or failure.  Often take medicines that are damaging to the kidneys.  What are the signs or symptoms? Symptoms of  this condition include:  Swelling (edema) of the face, legs, ankles, or feet.  Tiredness (lethargy) and having less energy.  Nausea or vomiting.  Confusion or trouble concentrating.  Problems with urination, such as: ? Painful or burning feeling during urination. ? Decreased urine production. ? Frequent urination, especially at night. ? Bloody urine.  Muscle twitches and cramps, especially in the legs.  Shortness of breath.  Weakness.  Loss of appetite.  Metallic taste in the mouth.  Trouble sleeping.  Dry, itchy skin.  A low blood count (anemia).  Pale lining of the eyelids and surface of the eye (conjunctiva).  Symptoms develop slowly and may not be obvious until the kidney damage becomes severe. It is possible to have kidney disease for years without having any symptoms. How is this diagnosed? This condition may be diagnosed based on:  Blood tests.  Urine tests.  Imaging tests, such as an ultrasound or CT scan.  A test in which a sample of tissue is removed from the kidneys to be examined under a microscope (kidney biopsy).  These test results will help your health care provider determine how serious the  CKD is. How is this treated? There is no cure for most cases of this condition, but treatment usually relieves symptoms and prevents or slows the progression of the disease. Treatment may include:  Making diet changes, which may require you to avoid alcohol, salty foods (sodium), and foods that are high in potassium, calcium, and protein.  Medicines: ? To lower blood pressure. ? To control blood glucose. ? To relieve anemia. ? To relieve swelling. ? To protect your bones. ? To improve the balance of electrolytes in your blood.  Removing toxic waste from the body through types of dialysis, if the kidneys can no longer do their job (kidney failure).  Managing any other conditions that are causing your CKD or making it worse.  Follow these instructions  at home: Medicines  Take over-the-counter and prescription medicines only as told by your health care provider. The dose of some medicines that you take may need to be adjusted.  Do not take any new medicines unless approved by your health care provider. Many medicines can worsen your kidney damage.  Do not take any vitamin and mineral supplements unless approved by your health care provider. Many nutritional supplements can worsen your kidney damage. General instructions  Follow your prescribed diet as told by your health care provider.  Do not use any products that contain nicotine or tobacco, such as cigarettes and e-cigarettes. If you need help quitting, ask your health care provider.  Monitor and track your blood pressure at home. Report changes in your blood pressure as told by your health care provider.  If you are being treated for diabetes, monitor and track your blood sugar (blood glucose) levels as told by your health care provider.  Maintain a healthy weight. If you need help with this, ask your health care provider.  Start or continue an exercise plan. Exercise at least 30 minutes a day, 5 days a week.  Keep your immunizations up to date as told by your health care provider.  Keep all follow-up visits as told by your health care provider. This is important. Where to find more information:  American Association of Kidney Patients: BombTimer.gl  National Kidney Foundation: www.kidney.[place]: https://mathis.com/  Life [facility]: www.lifeoptions.org and www.kidneyschool.org Contact a health care provider if:  Your symptoms get worse.  You develop new symptoms. Get help right away if:  You develop symptoms of ESRD, which include: ? Headaches. ? Numbness in the hands or feet. ? Easy bruising. ? Frequent hiccups. ? Chest pain. ? Shortness of breath. ? Lack of menstruation, in women.  You have a fever.  You have decreased  urine production.  You have pain or bleeding when you urinate. Summary  Chronic kidney disease (CKD) occurs when the kidneys become damaged slowly over a long period of time.  The most common causes of this condition are diabetes and high blood pressure (hypertension).  There is no cure for most cases of this condition, but treatment usually relieves symptoms and prevents or slows the progression of the disease. Treatment may include a combination of medicines and lifestyle changes. This information is not intended to replace advice given to you by your health care provider. Make sure you discuss any questions you have with your health care provider. Document Released: 02/26/2008 Document Revised: 06/26/2016 Document Reviewed: 06/26/2016 Elsevier Interactive Patient Education  [employer].

## 2018-02-22 ENCOUNTER — Telehealth: Payer: Self-pay | Admitting: Internal Medicine

## 2018-02-22 DIAGNOSIS — N185 Chronic kidney disease, stage 5: Secondary | ICD-10-CM | POA: Diagnosis not present

## 2018-02-22 NOTE — Telephone Encounter (Signed)
Copied from [place] [id number]. Topic: Quick Communication - See Telephone Encounter >> Feb 22, 2018 12:52 PM Rutherford Nail, NT wrote: CRM for notification. See Telephone encounter for: 02/22/18. Patient calling and would like to know which doctor or office did Dr Aundra Dubin recommend her to go to for her kidney? Requesting call this afternoon. Please advise. CB#: [phone]

## 2018-02-22 NOTE — Telephone Encounter (Signed)
Dr. Jimmy Footman, Dr. Thea Silversmith other kidney doctors at [facility] [place]

## 2018-02-23 ENCOUNTER — Other Ambulatory Visit (INDEPENDENT_AMBULATORY_CARE_PROVIDER_SITE_OTHER): Payer: Medicare Other

## 2018-02-23 DIAGNOSIS — E785 Hyperlipidemia, unspecified: Secondary | ICD-10-CM

## 2018-02-23 DIAGNOSIS — E039 Hypothyroidism, unspecified: Secondary | ICD-10-CM

## 2018-02-23 DIAGNOSIS — Z0184 Encounter for antibody response examination: Secondary | ICD-10-CM

## 2018-02-23 DIAGNOSIS — Z1159 Encounter for screening for other viral diseases: Secondary | ICD-10-CM

## 2018-02-23 DIAGNOSIS — I10 Essential (primary) hypertension: Secondary | ICD-10-CM | POA: Diagnosis not present

## 2018-02-23 LAB — LIPID PANEL
CHOL/HDL RATIO: 6
Cholesterol: 326 mg/dL — ABNORMAL HIGH (ref 0–200)
HDL: 57.8 mg/dL (ref >39.00)
LDL CALC: 243 mg/dL — AB (ref 0–99)
NONHDL: 268.43
Triglycerides: 127 mg/dL (ref 0.0–149.0)
VLDL: 25.4 mg/dL (ref 0.0–40.0)

## 2018-02-23 LAB — HEPATIC FUNCTION PANEL
ALBUMIN: 4 g/dL (ref 3.5–5.2)
ALT: 10 U/L (ref 0–35)
AST: 15 U/L (ref 0–37)
Alkaline Phosphatase: 50 U/L (ref 39–117)
BILIRUBIN TOTAL: 0.4 mg/dL (ref 0.2–1.2)
Bilirubin, Direct: 0.1 mg/dL (ref 0.0–0.3)
Total Protein: 7.1 g/dL (ref 6.0–8.3)

## 2018-02-23 LAB — TSH: TSH: 1.04 u[IU]/mL (ref 0.35–4.50)

## 2018-02-23 NOTE — Telephone Encounter (Signed)
Patient has been informed in office

## 2018-02-24 LAB — MEASLES/MUMPS/RUBELLA IMMUNITY
Mumps IgG: 62.5 AU/mL
RUBELLA: 10.1 {index}
Rubeola IgG: >300 AU/mL

## 2018-04-20 DIAGNOSIS — H401121 Primary open-angle glaucoma, left eye, mild stage: Secondary | ICD-10-CM | POA: Diagnosis not present

## 2018-04-20 DIAGNOSIS — H401113 Primary open-angle glaucoma, right eye, severe stage: Secondary | ICD-10-CM | POA: Diagnosis not present

## 2018-05-19 ENCOUNTER — Ambulatory Visit: Payer: BLUE CROSS/BLUE SHIELD | Admitting: Internal Medicine

## 2018-05-19 ENCOUNTER — Encounter: Payer: Self-pay | Admitting: Internal Medicine

## 2018-05-19 DIAGNOSIS — N2581 Secondary hyperparathyroidism of renal origin: Secondary | ICD-10-CM | POA: Diagnosis not present

## 2018-05-19 DIAGNOSIS — N185 Chronic kidney disease, stage 5: Secondary | ICD-10-CM | POA: Diagnosis not present

## 2018-05-19 DIAGNOSIS — I120 Hypertensive chronic kidney disease with stage 5 chronic kidney disease or end stage renal disease: Secondary | ICD-10-CM | POA: Diagnosis not present

## 2018-05-19 DIAGNOSIS — Q613 Polycystic kidney, unspecified: Secondary | ICD-10-CM | POA: Diagnosis not present

## 2018-06-16 ENCOUNTER — Encounter: Payer: Self-pay | Admitting: Internal Medicine

## 2018-06-16 ENCOUNTER — Ambulatory Visit (INDEPENDENT_AMBULATORY_CARE_PROVIDER_SITE_OTHER): Payer: Medicare Other | Admitting: Internal Medicine

## 2018-06-16 VITALS — BP 168/92 | HR 59 | Temp 98.3°F | Ht 63.0 in | Wt 181.6 lb

## 2018-06-16 DIAGNOSIS — E785 Hyperlipidemia, unspecified: Secondary | ICD-10-CM | POA: Diagnosis not present

## 2018-06-16 DIAGNOSIS — I120 Hypertensive chronic kidney disease with stage 5 chronic kidney disease or end stage renal disease: Secondary | ICD-10-CM | POA: Diagnosis not present

## 2018-06-16 DIAGNOSIS — N185 Chronic kidney disease, stage 5: Secondary | ICD-10-CM | POA: Diagnosis not present

## 2018-06-16 DIAGNOSIS — I10 Essential (primary) hypertension: Secondary | ICD-10-CM

## 2018-06-16 MED ORDER — EZETIMIBE 10 MG PO TABS: 10.0000 mg | ORAL_TABLET | Freq: Every day | ORAL | 3 refills | Status: DC

## 2018-06-16 MED ORDER — HYDRALAZINE HCL 25 MG PO TABS: 25.0000 mg | ORAL_TABLET | Freq: Two times a day (BID) | ORAL | 3 refills | Status: DC

## 2018-06-16 NOTE — Progress Notes (Signed)
Chief Complaint  Patient presents with  . Follow-up   F/u  1. HTN uncontrolled not taking hydralazine 25 mg bid on coreg 6.25 mg bid and lasix 80 mg qd ? 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 Joelyn Oms in [place] she wants to hold on HD until necessary GFR 13/14 and Cr 3.75 to 3.5 recently with renal  3. HLD she is agreeable to start zetia today    Review of Systems  Constitutional: Positive for weight loss.  HENT: Negative for hearing loss.   Eyes: Negative for blurred vision.  Respiratory: Negative for shortness of breath.   Cardiovascular: Negative for chest pain and leg swelling.  Gastrointestinal: Negative for abdominal pain.  Skin: Negative for rash.  Neurological: Negative for headaches.  Psychiatric/Behavioral: Negative for depression.       +stress   Past Medical History:  Diagnosis Date  . Abnormal Pap smear of cervix    mid 40s to 50s   . Allergy   . Anemia   . Brittle nails   . Chicken pox   . Chronic kidney disease    CKD 4/borderline 5   . Emphysema of lung (HCC)    chronic bronchitis   . Fibrocystic breast changes   . Glaucoma    [name] [name]  . Heart murmur   . Hyperlipidemia   . Hypertension   . Measles    childhood  . Secondary hyperparathyroidism ([place])   . Shoulder pain   . Thyroid disease    hypothyroidism    Past Surgical History:  Procedure Laterality Date  . ABDOMINAL HYSTERECTOMY     total 2/2 fibroids in [place] CYST REMOVAL  12/2012   Performed at [facility]  . UTERINE FIBROID SURGERY     In [place] several years ago  . VARICOSE VEIN SURGERY     left with h/o abnormal abi   . VEIN LIGATION AND STRIPPING Left    Performed in [place]   Family History  Problem Relation Age of Onset  . Hypertension Mother   . Diabetes Mother   . Arthritis Mother   . Cancer Mother   . Hypertension Father   . Cancer Father        colon, prostate  . Stroke Father        complications from HTN  . Alcohol abuse Brother   . COPD Brother   .  Depression Brother   . Drug abuse Brother   . Early death Brother   . Mental illness Daughter   . Diabetes Son   . Alcohol abuse Brother   . Drug abuse Brother   . Early death Brother   . Cystic kidney disease Other   . Kidney failure Cousin   . Sickle cell anemia Other    Social History   Socioeconomic History  . Marital status: Married    Spouse name: Not on file  . Number of children: Not on file  . Years of education: Not on file  . Highest education level: Not on file  Occupational History  . Not on file  Social Needs  . Financial resource strain: Not hard at all  . Food insecurity:    Worry: Never true    Inability: Never true  . Transportation needs:    Medical: No    Non-medical: No  Tobacco Use  . Smoking status: Never Smoker  . Smokeless tobacco: Never Used  Substance and Sexual Activity  . Alcohol  use: No    Alcohol/week: 0.0 standard drinks  . Drug use: No  . Sexual activity: Not Currently  Lifestyle  . Physical activity:    Days per week: Not on file    Minutes per session: Not on file  . Stress: Not at all  Relationships  . Social connections:    Talks on phone: Not on file    Gets together: Not on file    Attends religious service: Not on file    Active member of club or organization: Not on file    Attends meetings of clubs or organizations: Not on file    Relationship status: Not on file  . Intimate partner violence:    Fear of current or ex partner: Not on file    Emotionally abused: Not on file    Physically abused: Not on file    Forced sexual activity: Not on file  Other Topics Concern  . Not on file  Social History Narrative   Husband died 24-May-2017 [name]   4 kids (2 sons, 2 daughters)    Never smoker    Lived in [place] before coming to [place] moved 2014/2015    Retired 2011    [employer]    Current Meds  Medication Sig  . carvedilol (COREG) 6.25 MG tablet Take 1 tablet (6.25 mg total) by mouth 2 (two) times daily with a  meal.  . dorzolamide-timolol (COSOPT) 22.3-6.8 MG/ML ophthalmic solution Place 1 drop into both eyes 2 (two) times daily.  . furosemide (LASIX) 80 MG tablet Take 40 mg by mouth daily.   . Latanoprostene Bunod (VYZULTA) 0.024 % SOLN Apply 1 drop to eye at bedtime. Both eyes per [name]  . Multiple Vitamins-Minerals (MULTI-DAY PLUS MINERALS PO) Take 1 tablet by mouth daily.   . niacin 100 MG tablet Take 100 mg by mouth at bedtime.  . selenium 50 MCG TABS tablet Take 50 mcg by mouth daily.   Allergies  Allergen Reactions  . Citrus Itching and Swelling   No results found for this or any previous visit (from the past 2160 hour(s)). Objective  Body mass index is 32.17 kg/m. Wt Readings from Last 3 Encounters:  06/16/18 181 lb 9.6 oz (82.4 kg)  02/16/18 186 lb (84.4 kg)  01/18/18 185 lb (83.9 kg)   Temp Readings from Last 3 Encounters:  06/16/18 98.3 F (36.8 C) (Oral)  02/16/18 98.4 F (36.9 C) (Oral)  01/18/18 98 F (36.7 C) (Oral)   BP Readings from Last 3 Encounters:  06/16/18 (!) 156/94  02/16/18 130/80  01/18/18 (!) 165/82   Pulse Readings from Last 3 Encounters:  06/16/18 (!) 59  02/16/18 61  01/18/18 (!) 52    Physical Exam Vitals signs and nursing note reviewed.  Constitutional:      Appearance: Normal appearance. She is well-developed. She is obese.  HENT:     Head: Normocephalic and atraumatic.     Nose: Nose normal.     Mouth/Throat:     Mouth: Mucous membranes are moist.     Pharynx: Oropharynx is clear.  Eyes:     Conjunctiva/sclera: Conjunctivae normal.     Pupils: Pupils are equal, round, and reactive to light.  Cardiovascular:     Rate and Rhythm: Normal rate and regular rhythm.     Heart sounds: Normal heart sounds.     Comments: Trace edema legs R>L  Pulmonary:     Effort: Pulmonary effort is normal.  Breath sounds: Normal breath sounds.  Skin:    General: Skin is warm and dry.  Neurological:     General: No focal deficit present.      Mental Status: She is alert and oriented to person, place, and time. Mental status is at baseline.     Gait: Gait normal.  Psychiatric:        Attention and Perception: Attention and perception normal.        Mood and Affect: Mood and affect normal.        Speech: Speech normal.        Behavior: Behavior normal. Behavior is cooperative.        Thought Content: Thought content normal.        Cognition and Memory: Cognition and memory normal.        Judgment: Judgment normal.     Assessment   1. 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 HLD  4. HM Plan   1.  rec take hydralazine 25 mg bid On coreg 6.25 mg bid  Lasix 80 mg qd  2. F/u renal CC note Pt wants to hold HD until needed  Emphasized BP control always uncontrolled and noncompliant with meds  3. Add zetia 10 mg qd  4.  Declines flu shot  MMR immune rec hep b vaccine not immunedisc'ed prev pt wants to think about it Tdap had 10/19/13 Dr. Heath Gold Will discshingrix in future Prevnar had 09/14/17  pna 23 10/19/13   Declines further mammograms though last 11/27/15 [facility] with b/l asymmetries -declined mammogram today  Last DEXA in [place] years ago agreeable to repeat ordered today -rec pt schedule given # again  Pap out of age window  Colonoscopy 8/4/14colonpolyps will disc with pt If wants repeat in 12/2017  -referred colonoscopy prev pt to schedule will call wants to see [place] in [place] Never smoker Pt [facility] Dr[facility] Venetia Maxon yet  Renal appt 05/19/18 Dr. Pearson [place] CKD 5 pt wants to do conservative care as long as possible BP 160/87 on BB and lasix   Repeat labs at f/u also monitor TSH h/o hypothyroidism off meds now  Provider: Dr. [name] [name]-[facility]

## 2018-06-16 NOTE — Progress Notes (Signed)
Pre visit review using our clinic review tool, if applicable. No additional management support is needed unless otherwise documented below in the visit note. 

## 2018-06-16 NOTE — Patient Instructions (Addendum)
Goal blood pressure <130/<80 ask if beet juice is ok or Hibiscus tea ok for you to have with kidneys   Ask about diet for kidneys, cholesterol and blood pressure     Dr. [facility]  [phone]  [place]    Ezetimibe Tablets What is this medicine? EZETIMIBE (ez ET i mibe) blocks the absorption of cholesterol from the stomach. It can help lower blood cholesterol for patients who are at risk of getting heart disease or a stroke. It is only for patients whose cholesterol level is not controlled by diet. This medicine may be used for other purposes; ask your health care provider or pharmacist if you have questions. COMMON BRAND NAME(S): Zetia What should I tell my health care provider before I take this medicine? They need to know if you have any of these conditions: -liver disease -an unusual or allergic reaction to ezetimibe, medicines, foods, dyes, or preservatives -pregnant or trying to get pregnant -breast-feeding How should I use this medicine? Take this medicine by mouth with a glass of water. Follow the directions on the prescription label. This medicine can be taken with or without food. Take your doses at regular intervals. Do not take your medicine more often than directed. Talk to your pediatrician regarding the use of this medicine in children. Special care may be needed. Overdosage: If you think you have taken too much of this medicine contact a poison control center or emergency room at once. NOTE: This medicine is only for you. Do not share this medicine with others. What if I miss a dose? If you miss a dose, take it as soon as you can. If it is almost time for your next dose, take only that dose. Do not take double or extra doses. What may interact with this medicine? Do not take this medicine with any of the following medications: -fenofibrate -gemfibrozil This medicine may also interact with the following medications: -antacids -cyclosporine -herbal medicines  like red yeast rice -other medicines to lower cholesterol or triglycerides This list may not describe all possible interactions. Give your health care provider a list of all the medicines, herbs, non-prescription drugs, or dietary supplements you use. Also tell them if you smoke, drink alcohol, or use illegal drugs. Some items may interact with your medicine. What should I watch for while using this medicine? Visit your doctor or health care professional for regular checks on your progress. You will need to have your cholesterol levels checked. If you are also taking some other cholesterol medicines, you will also need to have tests to make sure your liver is working properly. Tell your doctor or health care professional if you get any unexplained muscle pain, tenderness, or weakness, especially if you also have a fever and tiredness. You need to follow a low-cholesterol, low-fat diet while you are taking this medicine. This will decrease your risk of getting heart and blood vessel disease. Exercising and avoiding alcohol and smoking can also help. Ask your doctor or dietician for advice. What side effects may I notice from receiving this medicine? Side effects that you should report to your doctor or health care professional as soon as possible: -allergic reactions like skin rash, itching or hives, swelling of the face, lips, or tongue -dark yellow or brown urine -unusually weak or tired -yellowing of the skin or eyes Side effects that usually do not require medical attention (report to your doctor or health care professional if they continue or are bothersome): -diarrhea -dizziness -  headache -stomach upset or pain This list may not describe all possible side effects. Call your doctor for medical advice about side effects. You may report side effects to FDA at 1-800-FDA-1088. Where should I keep my medicine? Keep out of the reach of children. Store at room temperature between 15 and 30 degrees  C (59 and 86 degrees F). Protect from moisture. Keep container tightly closed. Throw away any unused medicine after the expiration date. NOTE: This sheet is a summary. It may not cover all possible information. If you have questions about this medicine, talk to your doctor, pharmacist, or health care provider.  2019 Elsevier/Gold Standard (2011-11-24 15:39:09)   End-Stage Kidney Disease End-stage kidney disease occurs when the kidneys are so damaged that they cannot function and cannot get better. This condition may also be referred to as end-stage renal disease or ESRD. The kidneys are two organs that do many important jobs in the body, including:  Removing wastes and extra fluids from the blood.  Making hormones that maintain the amount of fluid in your tissues and blood vessels.  Maintaining the right amount of fluids and chemicals in the body. Without functioning kidneys, toxins build up in the blood and life-threatening complications can occur. What are the causes? This condition usually occurs when a long-term (chronic) kidney disease gets worse and results in permanent damage to the kidneys. It may also be caused by sudden damage to the kidneys (acute kidney injury). Causes of this condition include:  Having a family history of chronic kidney disease (CKD).  Having chronic kidney disease for many years.  Chronic medical conditions that affect the kidneys, such as: ? Cardiovascular disease, including high blood pressure. ? Diabetes. ? Certain diseases that affect the body's disease-fighting (immune) system.  Overuse of over-the-counter pain medicines.  Being around or being in contact with poisonous (toxic) substances. What increases the risk? The following factors may make you more likely to develop this condition:  Being older than 60.  Being female.  Being of African-American, Asian, Native American, [place], or Hispanic descent.  Smoking or a history of  smoking.  Obesity. What are the signs or symptoms? Symptoms of this condition include:  Swelling (edema) of the face, legs, ankles, or feet.  Numbness, tingling, or loss of feeling in the hands or feet.  Tiredness (lethargy).  Nausea or vomiting.  Confusion, trouble concentrating, or loss of consciousness.  Chest pain.  Shortness of breath.  Passing little or no urine.  Muscle twitches and cramps, especially in the legs.  Dry, itchy skin.  Loss of appetite.  Pale skin due to anemia, including the skin and tissue around the eye (conjunctiva).  Headaches.  Abnormally dark or light skin.  Decrease in muscle size (muscle wasting).  Easy bruising.  Frequent hiccups.  Stopping of the monthly period in women.  Jerky movements (seizures). How is this diagnosed? This condition may be diagnosed based on:  A physical exam, including blood pressure measurements.  Urine tests.  Blood tests.  Imaging tests.  A test in which a sample of tissue is removed from the kidneys to be examined under a microscope (kidney biopsy). How is this treated? This condition may be treated with:  A procedure that removes toxic wastes from the body (dialysis). There are two types of dialysis: ? Dialysis that is done through your abdomen (peritoneal dialysis). This may be done several times a day. ? Dialysis that is done by a machine (hemodialysis). This may be done several  times a week.  Surgery to receive a new kidney (kidney transplant). In addition to having dialysis or a kidney transplant, you may need to take medicines:  To control high blood pressure (hypertension).  To control high cholesterol.  To treat diabetes.  To maintain healthy levels of minerals in the blood (electrolytes). You may also be given a specific meal plan to follow that includes requirements or limits for:  Salt (sodium).  Protein.  Phosphorous.  Potassium.  Calcium. Follow these instructions  at home: Medicines  Take over-the-counter and prescription medicines only as told by your health care provider.  Do not take any new medicines, vitamins, or mineral supplements unless approved by your health care provider. Many medicines and supplements can worsen kidney damage.  Follow instructions from your health care provider about adjusting the doses of any medicines you take. Lifestyle  Do not use any products that contain nicotine or tobacco, such as cigarettes and e-cigarettes. If you need help quitting, ask your health care provider.  Achieve and maintain a healthy weight. If you need help with this, ask your health care provider.  Start or continue an exercise plan. Exercise at least 30 minutes a day, 5 days a week.  Follow your prescribed meal plan. General instructions  Stay current with your shots (immunizations) as told by your health care provider.  Keep track of your blood pressure. Report changes in your blood pressure as told by your health care provider.  If you are being treated for diabetes, monitor and track your blood sugar (blood glucose) levels as told by your health care provider.  Keep all follow-up visits as told by your health care provider. This is important. Where to find more information  American Association of Kidney Patients: BombTimer.gl  National Kidney Foundation: www.kidney.[place]: https://mathis.com/  [facility]: www.lifeoptions.org and www.kidneyschool.org Contact a health care provider if:  Your symptoms get worse.  You develop new symptoms. Get help right away if:  You have weakness in an arm or leg on one side of your body.  You have difficulty speaking or you are slurring your speech.  You have a sudden change in your vision.  You have a sudden, severe headache.  You have a sudden weight increase.  You have difficulty breathing.  Your symptoms suddenly get  worse. Summary  End-stage kidney disease occurs when the kidneys are so damaged that they cannot function and cannot get better.  Without functioning kidneys, toxins build up in the blood and life-threatening complications can occur.  Treatment may include dialysis or a kidney transplant along with medicines and lifestyle changes. This information is not intended to replace advice given to you by your health care provider. Make sure you discuss any questions you have with your health care provider. Document Released: 08/09/2003 Document Revised: 06/24/2016 Document Reviewed: 06/24/2016 Elsevier Interactive Patient Education  2019 Elsevier Inc.   Cholesterol Cholesterol is a white, waxy, fat-like substance that is needed by the human body in small amounts. The liver makes all the cholesterol we need. Cholesterol is carried from the liver by the blood through the blood vessels. Deposits of cholesterol (plaques) may build up on blood vessel (artery) walls. Plaques make the arteries narrower and stiffer. Cholesterol plaques increase the risk for heart attack and stroke. You cannot feel your cholesterol level even if it is very high. The only way to know that it is high is to have a blood test. Once you know your cholesterol  levels, you should keep a record of the test results. Work with your health care provider to keep your levels in the desired range. What do the results mean?  Total cholesterol is a rough measure of all the cholesterol in your blood.  LDL (low-density lipoprotein) is the "bad" cholesterol. This is the type that causes plaque to build up on the artery walls. You want this level to be low.  HDL (high-density lipoprotein) is the "good" cholesterol because it cleans the arteries and carries the LDL away. You want this level to be high.  Triglycerides are fat that the body can either burn for energy or store. High levels are closely linked to heart disease. What are the desired  levels of cholesterol?  Total cholesterol below 200.  LDL below 100 for people who are at risk, below 70 for people at very high risk.  HDL above 40 is good. A level of 60 or higher is considered to be protective against heart disease.  Triglycerides below 150. How can I lower my cholesterol? Diet Follow your diet program as told by your health care provider.  Choose fish or white meat chicken and [place], roasted or baked. Limit fatty cuts of red meat, fried foods, and processed meats, such as sausage and lunch meats.  Eat lots of fresh fruits and vegetables.  Choose whole grains, beans, pasta, potatoes, and cereals.  Choose olive oil, corn oil, or canola oil, and use only small amounts.  Avoid butter, mayonnaise, shortening, or palm kernel oils.  Avoid foods with trans fats.  Drink skim or nonfat milk and eat low-fat or nonfat yogurt and cheeses. Avoid whole milk, cream, ice cream, egg yolks, and full-fat cheeses.  Healthier desserts include angel food cake, ginger snaps, animal crackers, hard candy, popsicles, and low-fat or nonfat frozen yogurt. Avoid pastries, cakes, pies, and cookies.  Exercise  Follow your exercise program as told by your health care provider. A regular program: ? Helps to decrease LDL and raise HDL. ? Helps with weight control.  Do things that increase your activity level, such as gardening, walking, and taking the stairs.  Ask your health care provider about ways that you can be more active in your daily life. Medicine  Take over-the-counter and prescription medicines only as told by your health care provider. ? Medicine may be prescribed by your health care provider to help lower cholesterol and decrease the risk for heart disease. This is usually done if diet and exercise have failed to bring down cholesterol levels. ? If you have several risk factors, you may need medicine even if your levels are normal. This information is not intended to  replace advice given to you by your health care provider. Make sure you discuss any questions you have with your health care provider. Document Released: 02/11/2001 Document Revised: 12/15/2015 Document Reviewed: 11/17/2015 Elsevier Interactive Patient Education  2019 Elsevier Inc.   Chronic Kidney Disease, Adult Chronic kidney disease (CKD) occurs when the kidneys become damaged slowly over a long period of time. The kidneys are a pair of organs that do many important jobs in the body, including:  Removing waste and extra fluid from the blood to make urine.  Making hormones that maintain the amount of fluid in tissues and blood vessels.  Maintaining the right amount of fluids and chemicals in the body. A small amount of kidney damage may not cause problems, but a large amount of damage may make it hard or impossible for the kidneys to  work the way they should. If steps are not taken to slow down kidney damage or to stop it from getting worse, the kidneys may stop working permanently (end-stage renal disease or ESRD). Most of the time, CKD does not go away, but it can often be controlled. People who have CKD are usually able to live normal lives. What are the causes? The most common causes of this condition are diabetes and high blood pressure (hypertension). Other causes include:  Heart and blood vessel (cardiovascular) disease.  Kidney diseases, such as: ? Glomerulonephritis. ? Interstitial nephritis. ? Polycystic kidney disease. ? Renal vascular disease.  Diseases that affect the immune system.  Genetic diseases.  Medicines that damage the kidneys, such as anti-inflammatory medicines.  Being around or being in contact with poisonous (toxic) substances.  A kidney or urinary infection that occurs again and again (recurs).  Vasculitis. This is swelling or inflammation of the blood vessels.  A problem with urine flow that may be caused by: ? Cancer. ? Having kidney stones more  than one time. ? An enlarged prostate, in males. What increases the risk? You are more likely to develop this condition if you:  Are older than age 21.  Are female.  Are African-American, Hispanic, Asian, [place], or American [place].  Are a current or former smoker.  Are obese.  Have a family history of kidney disease or failure.  Often take medicines that are damaging to the kidneys. What are the signs or symptoms? Symptoms of this condition include:  Swelling (edema) of the face, legs, ankles, or feet.  Tiredness (lethargy) and having less energy.  Nausea or vomiting.  Confusion or trouble concentrating.  Problems with urination, such as: ? Painful or burning feeling during urination. ? Decreased urine production. ? Frequent urination, especially at night. ? Bloody urine.  Muscle twitches and cramps, especially in the legs.  Shortness of breath.  Weakness.  Loss of appetite.  Metallic taste in the mouth.  Trouble sleeping.  Dry, itchy skin.  A low blood count (anemia).  Pale lining of the eyelids and surface of the eye (conjunctiva). Symptoms develop slowly and may not be obvious until the kidney damage becomes severe. It is possible to have kidney disease for years without having any symptoms. How is this diagnosed? This condition may be diagnosed based on:  Blood tests.  Urine tests.  Imaging tests, such as an ultrasound or CT scan.  A test in which a sample of tissue is removed from the kidneys to be examined under a microscope (kidney biopsy). These test results will help your health care provider determine how serious the CKD is. How is this treated? There is no cure for most cases of this condition, but treatment usually relieves symptoms and prevents or slows the progression of the disease. Treatment may include:  Making diet changes, which may require you to avoid alcohol, salty foods (sodium), and foods that are high in potassium,  calcium, and protein.  Medicines: ? To lower blood pressure. ? To control blood glucose. ? To relieve anemia. ? To relieve swelling. ? To protect your bones. ? To improve the balance of electrolytes in your blood.  Removing toxic waste from the body through types of dialysis, if the kidneys can no longer do their job (kidney failure).  Managing any other conditions that are causing your CKD or making it worse. Follow these instructions at home: Medicines  Take over-the-counter and prescription medicines only as told by your health care  provider. The dose of some medicines that you take may need to be adjusted.  Do not take any new medicines unless approved by your health care provider. Many medicines can worsen your kidney damage.  Do not take any vitamin and mineral supplements unless approved by your health care provider. Many nutritional supplements can worsen your kidney damage. General instructions  Follow your prescribed diet as told by your health care provider.  Do not use any products that contain nicotine or tobacco, such as cigarettes and e-cigarettes. If you need help quitting, ask your health care provider.  Monitor and track your blood pressure at home. Report changes in your blood pressure as told by your health care provider.  If you are being treated for diabetes, monitor and track your blood sugar (blood glucose) levels as told by your health care provider.  Maintain a healthy weight. If you need help with this, ask your health care provider.  Start or continue an exercise plan. Exercise at least 30 minutes a day, 5 days a week.  Keep your immunizations up to date as told by your health care provider.  Keep all follow-up visits as told by your health care provider. This is important. Where to find more information  American Association of Kidney Patients: BombTimer.gl  National Kidney Foundation: www.kidney.[place]:  https://mathis.com/  [facility]: www.lifeoptions.org and www.kidneyschool.org Contact a health care provider if:  Your symptoms get worse.  You develop new symptoms. Get help right away if:  You develop symptoms of ESRD, which include: ? Headaches. ? Numbness in the hands or feet. ? Easy bruising. ? Frequent hiccups. ? Chest pain. ? Shortness of breath. ? Lack of menstruation, in women.  You have a fever.  You have decreased urine production.  You have pain or bleeding when you urinate. Summary  Chronic kidney disease (CKD) occurs when the kidneys become damaged slowly over a long period of time.  The most common causes of this condition are diabetes and high blood pressure (hypertension).  There is no cure for most cases of this condition, but treatment usually relieves symptoms and prevents or slows the progression of the disease. Treatment may include a combination of medicines and lifestyle changes. This information is not intended to replace advice given to you by your health care provider. Make sure you discuss any questions you have with your health care provider. Document Released: 02/26/2008 Document Revised: 06/26/2016 Document Reviewed: 06/26/2016 Elsevier Interactive Patient Education  2019 [employer].

## 2018-06-17 ENCOUNTER — Encounter: Payer: Medicare Other | Attending: Nephrology | Admitting: Registered"

## 2018-06-17 ENCOUNTER — Encounter: Payer: Self-pay | Admitting: Internal Medicine

## 2018-06-17 ENCOUNTER — Ambulatory Visit: Payer: BLUE CROSS/BLUE SHIELD | Admitting: Registered"

## 2018-06-17 ENCOUNTER — Encounter: Payer: Self-pay | Admitting: Registered"

## 2018-06-17 DIAGNOSIS — N289 Disorder of kidney and ureter, unspecified: Secondary | ICD-10-CM | POA: Diagnosis not present

## 2018-06-17 NOTE — Progress Notes (Signed)
Note sent to provider electronically

## 2018-06-17 NOTE — Patient Instructions (Signed)
Instructions/Goals:    Continue with 3 meals per day.   Daily limitations: Phosphorus: [phone] mg/day      Potassium:  Less than 2400 mg/day                            Sodium less than 1000 mg/day   For sodium-look for foods with around 5% or less daily value on label.    Recommend 7-8 oz/ servings protein per day (see handout). Recommend using food scale for accuracy.    Recommend avoiding high potassium foods and limiting those with moderate potassium to no more than 2 servings per day. Focus on low potassium fruits and vegetables   Recommend limiting fluids to 40 oz daily.    Recommend avoiding high phosphorus foods. If occasionally having a milk substitute recommend no more than 1/2 cup per day.    Recommend talking with your nephrologist regarding a renal vitamin to supplement restricted diet.

## 2018-06-17 NOTE — Progress Notes (Signed)
Medical Nutrition Therapy:  Appt start time: 8786 end time:  0935.  Assessment:  Primary concerns today: Pt referred due to renal insuffiencey. Noted pt has been dx with Stage 5 CKD and hyperparathyroidism. Pt present for appointment alone. Pt reports she would like to know how to eat to help her kidneys if possible. Pt reports that she is aware that eventually she may have to do dialysis, but she wants to put it off as long as possible. Pt reports that the number of times she urinates daily varies according to her fluid intake. Pt reports she urinated 4-5 times this morning before appointment. Pt reports she does not track fluid intake but estimates that she typically drinks around 32 oz daily. Pt reports that her appetite is fine if she feels comfortable with what she is eating. Pt reports that sometimes she feels bloated but reports she believes this occurs when she eats something higher in sodium. Pt reports she does not eat fast food anymore and tries to prepare her own foods instead. Pt reports she eats sourdough bread, green peppers, onions, does a lot of stir-fried vegetables often. Likes to do spaghetti with sauce that contains chili peppers. Pt is unsure of the name of the sauce but reports she can bring it with her to next visit. Pt reports that she has been trying to limit foods high in sodium and potassium. Pt reports that she takes an OTC multivitamin. Pt reports she has not discussed taking a renal multivitamin with her nephrologist. Pt reports that she sometimes drinks almond milk.   Food Allergies/Intolerances: citrus fruit.   Noted Lab Values: 05/19/18: BUN: 42 Creatinine: 3.56 GFR: 14 Potassium: 4.6 (WNL) Sodium: 141 (WNL) Phosphorus: 5.1 Calcium: 9.1 (WNL) HGB: 10.7 PTH: 92  02/23/18 LDL Cholesterol: 243  Preferred Learning Style:  No preference indicated   Learning Readiness:   Ready  MEDICATIONS: See list. Reviewed.    DIETARY INTAKE:  Usual eating pattern  includes 2-3 meals per day. Typically has breakfast in the morning which includes peppers, mushrooms, onions, and some pasta and chili pepper sauce.   Everyday foods include peppers, mushrooms, onions, pasta, chili pepper sauce.  Avoided foods include foods high in sodium, fast food.    24-hr recall:  B ( AM): red pepper, onions, pita bread with shredded sharp cheddar cheese, 1 cup coffee with cinnamon, carnation milk, honey Snk ( AM): None reported.  L ( PM): None reported.  Snk ( PM): None reported.  D ( PM): 1 slice of pumpkin pie, sourdough bread, peppers, 1 cup water Snk ( PM): 1 cup water with medication.  Beverages: pt is unsure how much water she drinks. Reports typically having around 32 oz fluids daily.   Usual physical activity: Pt reports that she has been trying to get in more walking (about 20 minutes) through walking more around the house.   Estimated needs: 2000-2400 calories 225-330 g carbohydrates 49-57 g protein  44-80 g fat  40 oz fluid [phone] mg/day phosphorus  < 2400 mg/day potassium  < 1000 mg/day Sodium   Progress Towards Goal(s):  In progress.   Nutritional Diagnosis:  NB-1.1 Food and nutrition-related knowledge deficit As related to nutrition therapy for stage 5 CKD.  As evidenced by no prior nutrition education for stage 5 CKD.    Intervention:  Nutrition counseling provided. Dietitian discussed symptoms of progressing CKD stage 5. Dietitian counseled pt on limitations/restrictions of CKD diet for stage 5 without dialysis. Dietitian discussed dietary recommendations and limitations  regarding each food group. Recommended pt avoid fruits and vegetables high in potassium (list provided) and limit those considered moderate to 2 servings daily. Recommended avoiding high phosphorus foods (list provided) and limiting sodium to no more than 1000 mg daily. Viewed nutrition labels with pt-discussed avoiding "phos" in ingredients list and choosing foods with around 5%  daily value or less for sodium. Discussed that it is best to avoid packaged foods as much as possible. Recommended pt use a food scale to measure food portions. Recommended pt discuss taking a renal multivitamin with her nephrologist to supplement diet due to dietary restrictions. Referred pt to see colleague, Sandie Ano, MS, RDN, CSOWM, LDN, at next visit as [place] has additional experience in renal nutrition. Pt appeared agreeable to information, goals discussed.    Instructions/Goals:    Continue with 3 meals per day.   Daily limitations: Phosphorus: [phone] mg/day      Potassium:  Less than 2400 mg/day                            Sodium: less than 1000 mg/day   For sodium-look for foods with around 5% or less daily value on label.    Recommend 7-8 oz/ servings protein per day (see handout). Recommend using food scale for accuracy.    Recommend avoiding high potassium foods and limiting those with moderate potassium to no more than 2 servings per day. Focus on low potassium fruits and vegetables   Recommend limiting fluids to 40 oz daily.    Recommend avoiding high phosphorus foods. If occasionally having a milk substitute recommend no more than 1/2 cup per day.   Recommend talking with your nephrologist regarding a renal vitamin to supplement restricted diet.   Teaching Method Utilized:  Visual Auditory  Handouts given during visit include:  CKD Stage 5 Nutrition Therapy   CKD Meal Guide Book   Barriers to learning/adherence to lifestyle change: Pt dx with stage 5 CKD.   Demonstrated degree of understanding via:  Teach Back   Monitoring/Evaluation:  Dietary intake, exercise, and body weight prn. Pt to follow up with colleague, Sandie Ano, MS, RDN, CSOWM, LDN in 2 weeks.

## 2018-07-08 DIAGNOSIS — N185 Chronic kidney disease, stage 5: Secondary | ICD-10-CM | POA: Diagnosis not present

## 2018-07-14 ENCOUNTER — Encounter: Payer: Self-pay | Admitting: Internal Medicine

## 2018-10-12 DIAGNOSIS — H2513 Age-related nuclear cataract, bilateral: Secondary | ICD-10-CM | POA: Diagnosis not present

## 2018-10-12 DIAGNOSIS — H401121 Primary open-angle glaucoma, left eye, mild stage: Secondary | ICD-10-CM | POA: Diagnosis not present

## 2018-10-12 DIAGNOSIS — H524 Presbyopia: Secondary | ICD-10-CM | POA: Diagnosis not present

## 2018-10-12 DIAGNOSIS — H5203 Hypermetropia, bilateral: Secondary | ICD-10-CM | POA: Diagnosis not present

## 2018-10-12 DIAGNOSIS — H401113 Primary open-angle glaucoma, right eye, severe stage: Secondary | ICD-10-CM | POA: Diagnosis not present

## 2018-10-15 ENCOUNTER — Encounter: Payer: Self-pay | Admitting: Internal Medicine

## 2018-10-15 ENCOUNTER — Ambulatory Visit (INDEPENDENT_AMBULATORY_CARE_PROVIDER_SITE_OTHER): Payer: Medicare Other | Admitting: Internal Medicine

## 2018-10-15 ENCOUNTER — Other Ambulatory Visit: Payer: Self-pay

## 2018-10-15 VITALS — BP 175/96 | HR 77

## 2018-10-15 DIAGNOSIS — E785 Hyperlipidemia, unspecified: Secondary | ICD-10-CM | POA: Diagnosis not present

## 2018-10-15 DIAGNOSIS — J329 Chronic sinusitis, unspecified: Secondary | ICD-10-CM

## 2018-10-15 DIAGNOSIS — Z9114 Patient's other noncompliance with medication regimen: Secondary | ICD-10-CM | POA: Insufficient documentation

## 2018-10-15 DIAGNOSIS — N185 Chronic kidney disease, stage 5: Secondary | ICD-10-CM | POA: Diagnosis not present

## 2018-10-15 DIAGNOSIS — I10 Essential (primary) hypertension: Secondary | ICD-10-CM

## 2018-10-15 DIAGNOSIS — J309 Allergic rhinitis, unspecified: Secondary | ICD-10-CM

## 2018-10-15 DIAGNOSIS — Z91148 Patient's other noncompliance with medication regimen for other reason: Secondary | ICD-10-CM | POA: Insufficient documentation

## 2018-10-15 DIAGNOSIS — R0982 Postnasal drip: Secondary | ICD-10-CM | POA: Diagnosis not present

## 2018-10-15 DIAGNOSIS — E039 Hypothyroidism, unspecified: Secondary | ICD-10-CM | POA: Diagnosis not present

## 2018-10-15 DIAGNOSIS — R7303 Prediabetes: Secondary | ICD-10-CM | POA: Diagnosis not present

## 2018-10-15 DIAGNOSIS — I120 Hypertensive chronic kidney disease with stage 5 chronic kidney disease or end stage renal disease: Secondary | ICD-10-CM

## 2018-10-15 MED ORDER — FUROSEMIDE 40 MG PO TABS: 40.0000 mg | ORAL_TABLET | Freq: Every day | ORAL | 3 refills | Status: DC | PRN

## 2018-10-15 NOTE — Progress Notes (Signed)
telephone Note  I connected with Holly Mcgee on 10/15/18 at  8:40 AM EDT by telephone and verified that I am speaking with the correct person using two identifiers.  Location patient: home Location provider:work  Persons participating in the virtual visit: patient, provider  I discussed the limitations of evaluation and management by telemedicine and the availability of in person appointments. The patient expressed understanding and agreed to proceed.   HPI: 1. HTN-BP today 175/96 HR 77 she has not been taking coreg 6.25 bid, hydralazine 25 mg bid but did take dose of lasix 40 mg last night. She reports no h/a, sob, dizziness. She reports she recently has been eating more salty foods. 06/16/2018 BP 168/92  She does on echo 09/22/16 have evidence of moderate LVH and mild valve abnormalities and diastolic dysfunction   She reports at times w/o medication BP runs 120-126   2. HLD not picked up zetia and wants to wait again on lab results before she starts to take this medication  3. CKD 4/5-she wants to hold on HD until last moment  4. Hypothyroidism off thyroid medication with normal TSH will repeat TSH  5. Glaucoma saw eye MD 10/13/2018 per pt pressure controlled.  6. She reports sinus problem and pressure on and off and h/a even when BP normal nothing tried. This is chronic issue. Disc with pt to take NS and 1/2 dose of antihistamine otc due to kidney function   ROS: See pertinent positives and negatives per HPI.  Past Medical History:  Diagnosis Date  . Abnormal Pap smear of cervix    mid 40s to 50s   . Allergy   . Anemia   . Brittle nails   . Chicken pox   . Chronic kidney disease    CKD 4/borderline 5   . 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 Emphysema of lung (HCC)    chronic bronchitis   . Fibrocystic breast changes   . Glaucoma    Dr. Marylynn Mcgee [name]  . Heart murmur   . Hyperlipidemia   . Hypertension   . Measles    childhood  . Secondary  hyperparathyroidism ([place])   . Shoulder pain   . Sleep apnea   . Thyroid disease    hypothyroidism     Past Surgical History:  Procedure Laterality Date  . ABDOMINAL HYSTERECTOMY     total 2/2 fibroids in [place] CYST REMOVAL  12/2012   Performed at [facility]  . UTERINE FIBROID SURGERY     In [place] several years ago  . VARICOSE VEIN SURGERY     left with h/o abnormal abi   . VEIN LIGATION AND STRIPPING Left    Performed in [place]    Family History  Problem Relation Age of Onset  . Hypertension Mother   . Diabetes Mother   . Arthritis Mother   . Cancer Mother   . Hypertension Father   . Cancer Father        colon, prostate  . Stroke Father        complications from HTN  . Alcohol abuse Brother   . COPD Brother   . Depression Brother   . Drug abuse Brother   . Early death Brother   . Mental illness Daughter   . Diabetes Son   . Alcohol abuse Brother   . Drug abuse Brother   . Early death Brother   . Cystic kidney disease  Other   . Kidney failure Cousin   . Sickle cell anemia Other     SOCIAL HX: widowed    Current Outpatient Medications:  .  carvedilol (COREG) 6.25 MG tablet, Take 1 tablet (6.25 mg total) by mouth 2 (two) times daily with a meal., Disp: 180 tablet, Rfl: 3 .  dorzolamide-timolol (COSOPT) 22.3-6.8 MG/ML ophthalmic solution, Place 1 drop into both eyes 2 (two) times daily., Disp: , Rfl:  .  ezetimibe (ZETIA) 10 MG tablet, Take 1 tablet (10 mg total) by mouth daily., Disp: 90 tablet, Rfl: 3 .  furosemide (LASIX) 40 MG tablet, Take 1 tablet (40 mg total) by mouth daily as needed., Disp: 90 tablet, Rfl: 3 .  hydrALAZINE (APRESOLINE) 25 MG tablet, Take 1 tablet (25 mg total) by mouth 2 (two) times daily., Disp: 180 tablet, Rfl: 3 .  Latanoprostene Bunod (VYZULTA) 0.024 % SOLN, Apply 1 drop to eye at bedtime. Both eyes per Dr. Marylynn Mcgee, Disp: 1 Bottle, Rfl: 0 .  Multiple Vitamins-Minerals (MULTI-DAY PLUS MINERALS PO), Take 1 tablet by mouth  daily. , Disp: , Rfl:  .  niacin 100 MG tablet, Take 100 mg by mouth at bedtime., Disp: , Rfl:  .  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[place])  Hyperlipidemia, unspecified hyperlipidemia type - Plan: Lipid panel -she is not taking zetia10 mg as Rx 06/2018  -recheck 11/19/2018 labs   Hypothyroidism, unspecified type - Plan: TSH of meds for now  Noncompliance with medications - Plan: Comprehensive metabolic panel, CBC w/Diff  Prediabetes - Plan: Hemoglobin A1c  Allergic rhinitis with postnasal drip Sinusitis, unspecified chronicity, unspecified location -disc NS and 1/2 dose of otc allergy medication w/o decongestant  -call back if not better will Rx abx   HM Declines flu shot  MMR immune rec hep b vaccine not immunedisc'edprevpt wants to think about it Tdap had 10/19/13 Dr. Heath Mcgee Will discshingrixin future Prevnar had 09/14/17  pna 23 10/19/13  Declines further mammograms though last [facility]/15 [facility] with b/l asymmetries -declined mammogram prev  Last DEXA in [place] years ago agreeable to repeat ordered today -rec pt schedule given # again  ordered by not scheduled   Pap out of age window  Colonoscopy 8/4/14colonpolyps will disc with pt If wants repeat in 12/2017  -referred colonoscopyprev pt to schedule will callwants to see [place] in [place] never had colonoscopy scheduled   Never smoker Pt reports  [facility] Venetia Mcgee yet  Renal appt 05/19/18 Dr. Pearson Mcgee CKD 5 pt wants to do conservative care as long as possible BP 160/87 on BB and lasix    I discussed the assessment and treatment plan with the patient. The patient was provided an opportunity to ask questions and all were answered. The patient agreed with the plan and demonstrated an understanding of the instructions.   The patient was advised to call back or seek an in-person evaluation if  the symptoms worsen or if the condition fails to improve as anticipated.  Time spent 15 minutes  Delorise Jackson, MD

## 2018-10-15 NOTE — Progress Notes (Signed)
Pre visit review using our clinic review tool, if applicable. No additional management support is needed unless otherwise documented below in the visit note. 

## 2018-11-12 ENCOUNTER — Ambulatory Visit: Payer: BLUE CROSS/BLUE SHIELD | Admitting: Internal Medicine

## 2018-11-12 ENCOUNTER — Ambulatory Visit: Payer: BLUE CROSS/BLUE SHIELD

## 2018-11-19 ENCOUNTER — Other Ambulatory Visit (INDEPENDENT_AMBULATORY_CARE_PROVIDER_SITE_OTHER): Payer: Medicare Other

## 2018-11-19 ENCOUNTER — Other Ambulatory Visit: Payer: Self-pay

## 2018-11-19 ENCOUNTER — Telehealth: Payer: Self-pay | Admitting: Lab

## 2018-11-19 DIAGNOSIS — N185 Chronic kidney disease, stage 5: Secondary | ICD-10-CM

## 2018-11-19 DIAGNOSIS — R7303 Prediabetes: Secondary | ICD-10-CM

## 2018-11-19 DIAGNOSIS — I10 Essential (primary) hypertension: Secondary | ICD-10-CM

## 2018-11-19 DIAGNOSIS — Z9114 Patient's other noncompliance with medication regimen: Secondary | ICD-10-CM | POA: Diagnosis not present

## 2018-11-19 DIAGNOSIS — E039 Hypothyroidism, unspecified: Secondary | ICD-10-CM

## 2018-11-19 DIAGNOSIS — E785 Hyperlipidemia, unspecified: Secondary | ICD-10-CM | POA: Diagnosis not present

## 2018-11-19 LAB — CBC WITH DIFFERENTIAL/PLATELET
Basophils Absolute: 0 10*3/uL (ref 0.0–0.1)
Basophils Relative: 0.9 % (ref 0.0–3.0)
Eosinophils Absolute: 0 10*3/uL (ref 0.0–0.7)
Eosinophils Relative: 1.8 % (ref 0.0–5.0)
HCT: 33.3 % — ABNORMAL LOW (ref 36.0–46.0)
Hemoglobin: 10.8 g/dL — ABNORMAL LOW (ref 12.0–15.0)
Lymphocytes Relative: 40 % (ref 12.0–46.0)
Lymphs Abs: 1.1 10*3/uL (ref 0.7–4.0)
MCHC: 32.5 g/dL (ref 30.0–36.0)
MCV: 94.1 fl (ref 78.0–100.0)
Monocytes Absolute: 0.2 10*3/uL (ref 0.1–1.0)
Monocytes Relative: 7.9 % (ref 3.0–12.0)
Neutro Abs: 1.3 10*3/uL — ABNORMAL LOW (ref 1.4–7.7)
Neutrophils Relative %: 49.4 % (ref 43.0–77.0)
Platelets: 176 10*3/uL (ref 150.0–400.0)
RBC: 3.53 Mil/uL — ABNORMAL LOW (ref 3.87–5.11)
RDW: 14.6 % (ref 11.5–15.5)
WBC: 2.7 10*3/uL — ABNORMAL LOW (ref 4.0–10.5)

## 2018-11-19 LAB — COMPREHENSIVE METABOLIC PANEL
ALT: 10 U/L (ref 0–35)
AST: 13 U/L (ref 0–37)
Albumin: 3.9 g/dL (ref 3.5–5.2)
Alkaline Phosphatase: 48 U/L (ref 39–117)
BUN: 68 mg/dL — ABNORMAL HIGH (ref 6–23)
CO2: 19 mEq/L (ref 19–32)
Calcium: 8.7 mg/dL (ref 8.4–10.5)
Chloride: 110 mEq/L (ref 96–112)
Creatinine, Ser: 4.59 mg/dL (ref 0.40–1.20)
GFR: 11.32 mL/min — CL (ref >60.00)
Glucose, Bld: 93 mg/dL (ref 70–99)
Potassium: 4.1 mEq/L (ref 3.5–5.1)
Sodium: 141 mEq/L (ref 135–145)
Total Bilirubin: 0.4 mg/dL (ref 0.2–1.2)
Total Protein: 6.4 g/dL (ref 6.0–8.3)

## 2018-11-19 LAB — LIPID PANEL
Cholesterol: 303 mg/dL — ABNORMAL HIGH (ref 0–200)
HDL: 64.1 mg/dL (ref >39.00)
LDL Cholesterol: 223 mg/dL — ABNORMAL HIGH (ref 0–99)
NonHDL: 238.4
Total CHOL/HDL Ratio: 5
Triglycerides: 76 mg/dL (ref 0.0–149.0)
VLDL: 15.2 mg/dL (ref 0.0–40.0)

## 2018-11-19 LAB — TSH: TSH: 0.85 u[IU]/mL (ref 0.35–4.50)

## 2018-11-19 LAB — HEMOGLOBIN A1C: Hgb A1c MFr Bld: 5.6 % (ref 4.6–6.5)

## 2018-11-19 NOTE — Telephone Encounter (Signed)
Lab called at 1:30pm stating Pt had Critical labs Creatine 4.59, and GFR 11.32. I notified Dr [name] of the results.

## 2018-11-19 NOTE — Telephone Encounter (Signed)
Noted will CC renal   [place]

## 2019-01-26 ENCOUNTER — Ambulatory Visit: Payer: BLUE CROSS/BLUE SHIELD | Admitting: Internal Medicine

## 2019-01-26 DIAGNOSIS — Z0289 Encounter for other administrative examinations: Secondary | ICD-10-CM

## 2019-02-02 ENCOUNTER — Ambulatory Visit: Payer: BLUE CROSS/BLUE SHIELD

## 2019-02-14 ENCOUNTER — Telehealth: Payer: Self-pay

## 2019-02-14 ENCOUNTER — Ambulatory Visit: Payer: Medicare Other

## 2019-02-14 ENCOUNTER — Other Ambulatory Visit: Payer: Self-pay

## 2019-02-14 NOTE — Telephone Encounter (Signed)
Patient never called back at agreed upon time.  I called patient back twice. Patient answered call and states she will call back tomorrow.  Removed from schedule.

## 2019-02-14 NOTE — Telephone Encounter (Signed)
Called patient at scheduled time for annual wellness and health maintenance update. Patient requests to reschedule for 2:00 today. Agreed for patient to return a call back to me today.

## 2019-02-16 ENCOUNTER — Emergency Department: Payer: Medicare Other

## 2019-02-16 ENCOUNTER — Emergency Department
Admission: EM | Admit: 2019-02-16 | Discharge: 2019-02-16 | Disposition: A | Discharge status: Home / Self Care | Payer: Medicare Other | Attending: Emergency Medicine | Admitting: Emergency Medicine

## 2019-02-16 ENCOUNTER — Other Ambulatory Visit: Payer: Self-pay

## 2019-02-16 DIAGNOSIS — R000 Tachycardia, unspecified: Secondary | ICD-10-CM | POA: Diagnosis not present

## 2019-02-16 DIAGNOSIS — I120 Hypertensive chronic kidney disease with stage 5 chronic kidney disease or end stage renal disease: Secondary | ICD-10-CM | POA: Insufficient documentation

## 2019-02-16 DIAGNOSIS — Z20828 Contact with and (suspected) exposure to other viral communicable diseases: Secondary | ICD-10-CM | POA: Insufficient documentation

## 2019-02-16 DIAGNOSIS — Z03818 Encounter for observation for suspected exposure to other biological agents ruled out: Secondary | ICD-10-CM | POA: Diagnosis not present

## 2019-02-16 DIAGNOSIS — E039 Hypothyroidism, unspecified: Secondary | ICD-10-CM | POA: Insufficient documentation

## 2019-02-16 DIAGNOSIS — I10 Essential (primary) hypertension: Secondary | ICD-10-CM | POA: Diagnosis not present

## 2019-02-16 DIAGNOSIS — R404 Transient alteration of awareness: Secondary | ICD-10-CM | POA: Diagnosis not present

## 2019-02-16 DIAGNOSIS — I517 Cardiomegaly: Secondary | ICD-10-CM | POA: Diagnosis not present

## 2019-02-16 DIAGNOSIS — R4182 Altered mental status, unspecified: Secondary | ICD-10-CM | POA: Insufficient documentation

## 2019-02-16 DIAGNOSIS — N185 Chronic kidney disease, stage 5: Secondary | ICD-10-CM | POA: Insufficient documentation

## 2019-02-16 DIAGNOSIS — R638 Other symptoms and signs concerning food and fluid intake: Secondary | ICD-10-CM | POA: Diagnosis not present

## 2019-02-16 DIAGNOSIS — I499 Cardiac arrhythmia, unspecified: Secondary | ICD-10-CM | POA: Diagnosis not present

## 2019-02-16 DIAGNOSIS — R4020 Unspecified coma: Secondary | ICD-10-CM | POA: Diagnosis not present

## 2019-02-16 LAB — URINALYSIS, COMPLETE (UACMP) WITH MICROSCOPIC
Bilirubin Urine: NEGATIVE
Glucose, UA: NEGATIVE mg/dL
Hgb urine dipstick: NEGATIVE
Ketones, ur: NEGATIVE mg/dL
Leukocytes,Ua: NEGATIVE
Nitrite: NEGATIVE
Protein, ur: 30 mg/dL — AB
Specific Gravity, Urine: 1.01 (ref 1.005–1.030)
pH: 6 (ref 5.0–8.0)

## 2019-02-16 LAB — CBC WITH DIFFERENTIAL/PLATELET
Abs Immature Granulocytes: 0.01 10*3/uL (ref 0.00–0.07)
Basophils Absolute: 0 10*3/uL (ref 0.0–0.1)
Basophils Relative: 1 %
Eosinophils Absolute: 0 10*3/uL (ref 0.0–0.5)
Eosinophils Relative: 1 %
HCT: 35.5 % — ABNORMAL LOW (ref 36.0–46.0)
Hemoglobin: 11.5 g/dL — ABNORMAL LOW (ref 12.0–15.0)
Immature Granulocytes: 0 %
Lymphocytes Relative: 32 %
Lymphs Abs: 1 10*3/uL (ref 0.7–4.0)
MCH: 29.8 pg (ref 26.0–34.0)
MCHC: 32.4 g/dL (ref 30.0–36.0)
MCV: 92 fL (ref 80.0–100.0)
Monocytes Absolute: 0.3 10*3/uL (ref 0.1–1.0)
Monocytes Relative: 11 %
Neutro Abs: 1.7 10*3/uL (ref 1.7–7.7)
Neutrophils Relative %: 55 %
Platelets: 185 10*3/uL (ref 150–400)
RBC: 3.86 MIL/uL — ABNORMAL LOW (ref 3.87–5.11)
RDW: 13.9 % (ref 11.5–15.5)
WBC: 3.1 10*3/uL — ABNORMAL LOW (ref 4.0–10.5)
nRBC: 0 % (ref 0.0–0.2)

## 2019-02-16 LAB — COMPREHENSIVE METABOLIC PANEL
ALT: 12 U/L (ref 0–44)
AST: 19 U/L (ref 15–41)
Albumin: 4.1 g/dL (ref 3.5–5.0)
Alkaline Phosphatase: 44 U/L (ref 38–126)
Anion gap: 12 (ref 5–15)
BUN: 52 mg/dL — ABNORMAL HIGH (ref 8–23)
CO2: 21 mmol/L — ABNORMAL LOW (ref 22–32)
Calcium: 9.4 mg/dL (ref 8.9–10.3)
Chloride: 109 mmol/L (ref 98–111)
Creatinine, Ser: 4.82 mg/dL — ABNORMAL HIGH (ref 0.44–1.00)
GFR calc Af Amer: 10 mL/min — ABNORMAL LOW (ref >60)
GFR calc non Af Amer: 8 mL/min — ABNORMAL LOW (ref >60)
Glucose, Bld: 85 mg/dL (ref 70–99)
Potassium: 4 mmol/L (ref 3.5–5.1)
Sodium: 142 mmol/L (ref 135–145)
Total Bilirubin: 0.9 mg/dL (ref 0.3–1.2)
Total Protein: 7.5 g/dL (ref 6.5–8.1)

## 2019-02-16 LAB — TROPONIN I (HIGH SENSITIVITY): Troponin I (High Sensitivity): 61 ng/L — ABNORMAL HIGH (ref <18)

## 2019-02-16 MED ORDER — SODIUM CHLORIDE 0.9 % IV SOLN
Freq: Once | INTRAVENOUS | Status: AC
  Administered 2019-02-16: 09:00:00 via INTRAVENOUS

## 2019-02-16 MED ORDER — LABETALOL HCL 5 MG/ML IV SOLN
10.0000 mg | Freq: Once | INTRAVENOUS | Status: AC
  Administered 2019-02-16: 10 mg via INTRAVENOUS
  Filled 2019-02-16: qty 4

## 2019-02-16 MED ORDER — CEPHALEXIN 250 MG PO CAPS: 250.0000 mg | ORAL_CAPSULE | Freq: Two times a day (BID) | ORAL | 0 refills | Status: AC

## 2019-02-16 MED ORDER — SODIUM CHLORIDE 0.9 % IV SOLN
1.0000 g | Freq: Once | INTRAVENOUS | Status: AC
  Administered 2019-02-16: 12:00:00 1 g via INTRAVENOUS
  Filled 2019-02-16: qty 10

## 2019-02-16 NOTE — ED Provider Notes (Signed)
[facility] Emergency Department Provider Note       Time seen: ----------------------------------------- 7:24 AM on 02/16/2019 ----------------------------------------- Level V caveat: History/ROS limited by altered mental status I have reviewed the triage vital signs and the nursing notes.  HISTORY   Chief Complaint Altered Mental Status    HPI Holly Mcgee is a 73 y.o. female with a history of allergies, anemia, chronic kidney disease, hyperlipidemia, hypertension, thyroid disease who presents to the ED for hallucinations.  Patient arrived by EMS from home.  She has had poor p.o. intake and is hallucinating.  Upon waking this morning she was acting more bizarre and the family decided to bring her here.  She is nonverbal on arrival, reportedly has chronic kidney disease that has been worsening and she is refusing dialysis.  Past Medical History:  Diagnosis Date  . Abnormal Pap smear of cervix    mid 40s to 50s   . Allergy   . Anemia   . Brittle nails   . Chicken pox   . Chronic kidney disease    CKD 4/borderline 5   . 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 Emphysema of lung (HCC)    chronic bronchitis   . Fibrocystic breast changes   . Glaucoma    [name] [name]  . Heart murmur   . Hyperlipidemia   . Hypertension   . Measles    childhood  . Secondary hyperparathyroidism ([place])   . Shoulder pain   . Sleep apnea   . Thyroid disease    hypothyroidism     Patient Active Problem List   Diagnosis Date Noted  . Noncompliance with medications 10/15/2018  . CKD (chronic kidney disease) stage 5, GFR less than 15 ml/min (HCC) 02/16/2018  . Grief 05/25/2017  . Bradycardia 05/25/2017  . Chronic venous insufficiency 05/12/2016  . Lymphedema 05/12/2016  . Venous stasis dermatitis of right lower extremity 05/12/2016  . Essential hypertension 03/14/2015  . Allergic rhinitis with postnasal drip 03/14/2015  . Dysuria 03/14/2015   . Glaucoma 03/14/2015  . Polycystic kidney disease 03/14/2015  . 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 Hypothyroidism 03/14/2015  . Hyperlipidemia 03/14/2015  . Prediabetes 03/14/2015  . Influenza vaccination declined by patient 03/14/2015    Past Surgical History:  Procedure Laterality Date  . ABDOMINAL HYSTERECTOMY     total 2/2 fibroids in [place] CYST REMOVAL  12/2012   Performed at [facility]  . UTERINE FIBROID SURGERY     In [place] several years ago  . VARICOSE VEIN SURGERY     left with h/o abnormal abi   . VEIN LIGATION AND STRIPPING Left    Performed in [place]    Allergies Citrus  Social History Social History   Tobacco Use  . Smoking status: Never Smoker  . Smokeless tobacco: Never Used  Substance Use Topics  . Alcohol use: No    Alcohol/week: 0.0 standard drinks  . Drug use: No   Review of Systems Positive for hallucinations, poor p.o. intake, otherwise unknown  All systems negative/normal/unremarkable except as stated in the HPI  ____________________________________________   PHYSICAL EXAM:  VITAL SIGNS: ED Triage Vitals  Enc Vitals Group     BP 02/16/19 0700 (!) 197/97     Pulse Rate 02/16/19 0700 93     Resp 02/16/19 0700 18     Temp 02/16/19 0700 99 F (37.2 C)  Temp Source 02/16/19 0700 Oral     SpO2 02/16/19 0647 100 %     Weight 02/16/19 0702 150 lb (68 kg)     Height 02/16/19 0702 5\' 2"  (1.575 m)     Head Circumference --      Peak Flow --      Pain Score --      Pain Loc --      Pain Edu? --      Excl. in [place]? --    Constitutional: Alert but does not communicate, no acute distress noted Eyes: Conjunctivae are normal. Normal extraocular movements. ENT      Head: Normocephalic and atraumatic.      Nose: No congestion/rhinnorhea.      Mouth/Throat: Mucous membranes are moist.      Neck: No stridor. Cardiovascular: Normal rate, regular rhythm. No murmurs, rubs, or gallops. Respiratory:  Normal respiratory effort without tachypnea nor retractions. Breath sounds are clear and equal bilaterally. No wheezes/rales/rhonchi. Gastrointestinal: Soft and nontender. Normal bowel sounds Musculoskeletal: Nontender with normal range of motion in extremities. No lower extremity tenderness nor edema. Neurologic:  No gross focal neurologic deficits are appreciated.  Patient is partially cooperative, will not communicate verbally Skin:  Skin is warm, dry and intact. No rash noted. Psychiatric: Patient appears almost catatonic, will not communicate.  She will squeeze my hands on command ____________________________________________  EKG: Interpreted by me.  Sinus rhythm with rate of 96 bpm, LVH with repolarization abnormality, normal axis, normal QT  ____________________________________________  ED COURSE:  As part of my medical decision making, I reviewed the following data within the [place] History obtained from family if available, nursing notes, old chart and ekg, as well as notes from prior ED visits. Patient presented for hallucinations in the setting of likely worsening kidney disease, we will assess with labs and imaging as indicated at this time.   Procedures  Holly Mcgee was evaluated in Emergency Department on 02/16/2019 for the symptoms described in the history of present illness. She was evaluated in the context of the global COVID-19 pandemic, which necessitated consideration that the patient might be at risk for infection with the SARS-CoV-2 virus that causes COVID-19. Institutional protocols and algorithms that pertain to the evaluation of patients at risk for COVID-19 are in a state of rapid change based on information released by regulatory bodies including the CDC and federal and state organizations. These policies and algorithms were followed during the patient's care in the ED.  ____________________________________________   LABS (pertinent  positives/negatives)  Labs Reviewed  CBC WITH DIFFERENTIAL/PLATELET - Abnormal; Notable for the following components:      Result Value   WBC 3.1 (*)    RBC 3.86 (*)    Hemoglobin 11.5 (*)    HCT 35.5 (*)    All other components within normal limits  COMPREHENSIVE METABOLIC PANEL - Abnormal; Notable for the following components:   CO2 21 (*)    BUN 52 (*)    Creatinine, Ser 4.82 (*)    GFR calc non Af Amer 8 (*)    GFR calc Af Amer 10 (*)    All other components within normal limits  URINALYSIS, COMPLETE (UACMP) WITH MICROSCOPIC - Abnormal; Notable for the following components:   APPearance CLOUDY (*)    Protein, ur 30 (*)    Bacteria, UA MANY (*)    All other components within normal limits  TROPONIN I (HIGH SENSITIVITY) - Abnormal; Notable for the following components:  Troponin I (High Sensitivity) 61 (*)    All other components within normal limits  URINE CULTURE  SARS CORONAVIRUS 2 (TAT 6-24 HRS)  CBG MONITORING, ED    RADIOLOGY Images were viewed by me  CT head  IMPRESSION:  Stable head CT without evidence of acute intracranial process. Mild  chronic small vessel ischemic changes.  ____________________________________________   DIFFERENTIAL DIAGNOSIS   Uremia, dehydration, electrolyte abnormality, occult infection, CVA, psychosis  FINAL ASSESSMENT AND PLAN  Altered mental status, chronic kidney disease   Plan: The patient had presented for hallucinations. [name] labs reveal chronic kidney disease and possible UTI without other acute process. [name] imaging was unremarkable.  I will plan on admitting her for her altered mental status and hallucinations but after a liter of fluids and some antibiotics she seemed to be back to normal.  She was refusing hospital admission and the son in the room was okay with her not being admitted.  She is cleared for outpatient follow-up.   Laurence Aly, MD    Note: This note was generated in part or whole  with voice recognition software. Voice recognition is usually quite accurate but there are transcription errors that can and very often do occur. I apologize for any typographical errors that were not detected and corrected.     Earleen Newport, MD 02/16/19 1141

## 2019-02-16 NOTE — ED Notes (Signed)
Pt refused psych consult

## 2019-02-16 NOTE — ED Triage Notes (Signed)
Patient arrived from home by [facility] EMS. Per EMS Patient has had AVH, poor oral intake, not going out, stating demons are out to get her and she is going to die. Upon waking this AM per family more bizarre acting. Patient was verbal before getting on ambulance and once on truck became non verbal. Patient continues to be non verbal with assessment questions.

## 2019-02-17 ENCOUNTER — Telehealth: Payer: Self-pay | Admitting: Internal Medicine

## 2019-02-17 LAB — URINE CULTURE: Culture: NO GROWTH

## 2019-02-17 LAB — SARS CORONAVIRUS 2 (TAT 6-24 HRS): SARS Coronavirus 2: NEGATIVE

## 2019-02-17 NOTE — Telephone Encounter (Signed)
err

## 2019-02-22 ENCOUNTER — Ambulatory Visit: Payer: Self-pay | Admitting: Internal Medicine

## 2019-02-22 NOTE — Telephone Encounter (Signed)
She needs to go back to ED this is what I rec based on symptoms today or tomorrow  She needs immediate attention and it she sees me tomorrow I will be sending her to the ED  I rec she go today   [facility]

## 2019-02-22 NOTE — Telephone Encounter (Signed)
Patients daughter and son was informed to go to ED per PCP request.  Patient wants to see Dr [name] in office and refuses to go to ED today. She was informed of what Dr [name] stated and she would still like to come in office then go to ED from here, as long as she sees Dr [name].

## 2019-02-22 NOTE — Telephone Encounter (Signed)
Pt's son and daughter calling. States pt "Will not respond. Just sits there with her eyes closed." States has not been eating or drinking for 2 weeks. Son reports pt has refused meds for "At least a month." Son reports pt's BP today 198/98. Unsure of HR.  States is hallucinating.Seen in ED 02/16/2019 for similar issues. States pt has not improved "At all." Advised ED, family states will call EMS.  Reason for Disposition . Difficult to awaken or acting confused (e.g., disoriented, slurred speech)  Answer Assessment - Initial Assessment Questions 1. SYMPTOM: "What is the main symptom you are concerned about?" (e.g., weakness, numbness)     See summary 2. ONSET: "When did this start?" (minutes, hours, days; while sleeping)     *No Answer* 3. LAST NORMAL: "When was the last time you were normal (no symptoms)?"     *No Answer* 4. PATTERN "Does this come and go, or has it been constant since it started?"  "Is it present now?"     *No Answer* 5. CARDIAC SYMPTOMS: "Have you had any of the following symptoms: chest pain, difficulty breathing, palpitations?"     *No Answer* 6. NEUROLOGIC SYMPTOMS: "Have you had any of the following symptoms: headache, dizziness, vision loss, double vision, changes in speech, unsteady on your feet?"     *No Answer* 7. OTHER SYMPTOMS: "Do you have any other symptoms?"     *No Answer* 8. PREGNANCY: "Is there any chance you are pregnant?" "When was your last menstrual period?"     *No Answer*  Protocols used: NEUROLOGIC DEFICIT-A-AH

## 2019-02-22 NOTE — Telephone Encounter (Signed)
Noted though I rec ED and encouraged to go  tMS

## 2019-02-22 NOTE — Telephone Encounter (Signed)
Patients daughter stated they scheduled an appointment with PCP for tomorrow. They were not going to go to ED today. FYI

## 2019-02-22 NOTE — Telephone Encounter (Signed)
Tried to call patient and verify going to ED but no answer left message to call office.

## 2019-02-23 ENCOUNTER — Ambulatory Visit: Payer: Medicare Other | Admitting: Internal Medicine

## 2019-03-18 ENCOUNTER — Encounter (HOSPITAL_COMMUNITY): Payer: Self-pay | Admitting: Emergency Medicine

## 2019-03-18 ENCOUNTER — Other Ambulatory Visit: Payer: Self-pay

## 2019-03-18 ENCOUNTER — Emergency Department (HOSPITAL_COMMUNITY): Payer: Medicare Other

## 2019-03-18 ENCOUNTER — Emergency Department (HOSPITAL_COMMUNITY)
Admission: EM | Admit: 2019-03-18 | Discharge: 2019-03-18 | Disposition: A | Discharge status: Home / Self Care | Payer: Medicare Other | Attending: Emergency Medicine | Admitting: Emergency Medicine

## 2019-03-18 DIAGNOSIS — E039 Hypothyroidism, unspecified: Secondary | ICD-10-CM | POA: Insufficient documentation

## 2019-03-18 DIAGNOSIS — Z79899 Other long term (current) drug therapy: Secondary | ICD-10-CM | POA: Insufficient documentation

## 2019-03-18 DIAGNOSIS — I120 Hypertensive chronic kidney disease with stage 5 chronic kidney disease or end stage renal disease: Secondary | ICD-10-CM | POA: Diagnosis not present

## 2019-03-18 DIAGNOSIS — R4182 Altered mental status, unspecified: Secondary | ICD-10-CM

## 2019-03-18 DIAGNOSIS — R0902 Hypoxemia: Secondary | ICD-10-CM | POA: Diagnosis not present

## 2019-03-18 DIAGNOSIS — N185 Chronic kidney disease, stage 5: Secondary | ICD-10-CM | POA: Insufficient documentation

## 2019-03-18 DIAGNOSIS — J439 Emphysema, unspecified: Secondary | ICD-10-CM | POA: Diagnosis not present

## 2019-03-18 DIAGNOSIS — I10 Essential (primary) hypertension: Secondary | ICD-10-CM | POA: Diagnosis not present

## 2019-03-18 LAB — URINALYSIS, ROUTINE W REFLEX MICROSCOPIC
Bilirubin Urine: NEGATIVE
Glucose, UA: NEGATIVE mg/dL
Ketones, ur: NEGATIVE mg/dL
Leukocytes,Ua: NEGATIVE
Nitrite: NEGATIVE
Protein, ur: 30 mg/dL — AB
Specific Gravity, Urine: 1.009 (ref 1.005–1.030)
pH: 5 (ref 5.0–8.0)

## 2019-03-18 LAB — BASIC METABOLIC PANEL
Anion gap: 12 (ref 5–15)
BUN: 53 mg/dL — ABNORMAL HIGH (ref 8–23)
CO2: 19 mmol/L — ABNORMAL LOW (ref 22–32)
Calcium: 8.8 mg/dL — ABNORMAL LOW (ref 8.9–10.3)
Chloride: 106 mmol/L (ref 98–111)
Creatinine, Ser: 4.98 mg/dL — ABNORMAL HIGH (ref 0.44–1.00)
GFR calc Af Amer: 9 mL/min — ABNORMAL LOW (ref >60)
GFR calc non Af Amer: 8 mL/min — ABNORMAL LOW (ref >60)
Glucose, Bld: 81 mg/dL (ref 70–99)
Potassium: 3.7 mmol/L (ref 3.5–5.1)
Sodium: 137 mmol/L (ref 135–145)

## 2019-03-18 LAB — CBC
HCT: 34.9 % — ABNORMAL LOW (ref 36.0–46.0)
Hemoglobin: 11.3 g/dL — ABNORMAL LOW (ref 12.0–15.0)
MCH: 30.9 pg (ref 26.0–34.0)
MCHC: 32.4 g/dL (ref 30.0–36.0)
MCV: 95.4 fL (ref 80.0–100.0)
Platelets: 156 10*3/uL (ref 150–400)
RBC: 3.66 MIL/uL — ABNORMAL LOW (ref 3.87–5.11)
RDW: 13.2 % (ref 11.5–15.5)
WBC: 4.1 10*3/uL (ref 4.0–10.5)
nRBC: 0 % (ref 0.0–0.2)

## 2019-03-18 LAB — TROPONIN I (HIGH SENSITIVITY)
Troponin I (High Sensitivity): 19 ng/L — ABNORMAL HIGH (ref <18)
Troponin I (High Sensitivity): 27 ng/L — ABNORMAL HIGH (ref <18)

## 2019-03-18 LAB — TSH: TSH: 0.624 u[IU]/mL (ref 0.350–4.500)

## 2019-03-18 LAB — CBG MONITORING, ED: Glucose-Capillary: 71 mg/dL (ref 70–99)

## 2019-03-18 MED ORDER — SODIUM CHLORIDE 0.9% FLUSH: 3.0000 mL | Freq: Once | INTRAVENOUS | Status: DC

## 2019-03-18 MED ORDER — CARVEDILOL 12.5 MG PO TABS
6.2500 mg | ORAL_TABLET | Freq: Once | ORAL | Status: AC
  Administered 2019-03-18: 6.25 mg via ORAL
  Filled 2019-03-18: qty 1

## 2019-03-18 NOTE — ED Triage Notes (Addendum)
Pt arrives from home where family called ems due to patient being "confused" per ems on there arrival pt was alert and oriented x 4 however had BP in 233/104.    Pt states she has not been taking her medications regularly states " I might not have taken them for 2 days".

## 2019-03-18 NOTE — ED Provider Notes (Signed)
[facility] EMERGENCY DEPARTMENT Provider Note   CSN: [id number] Arrival date & time: 03/18/19  1701     History   Chief Complaint Chief Complaint  Patient presents with  . Hypertension    HPI Holly Mcgee is a 73 y.o. female with a past medical history significant for CKD stage 4, emphysema, glaucoma, hyperlipidemia, hypertension, and hypothyroidism who presents to the ED via EMS due to patient being "confused" per patient's daughter. Patient explains she does not know why her daughter called EMS and does not want me to call her daughter. Patient states she has not been taking her medications for the past 2-3 days. Patient notes she has had normal urination with no dysuria. Denies falling or hitting her head. Patient is not on any anticoagulants. No history of liver dysfunction. Patient was recently seen on 02/16/2019 for AMS with unremarkable workup. Patient denies headaches, changes to vision, chest pain, shortness of breath, fever, palpitations, abdominal pain, diarrhea, and vomiting.   [name] daughter appeared at bedside and explains the patient has been confused for numerous months. She states her mother has not been taking her medications or going to her doctor appointments. The daughter notes that confusion is always worse in the afternoon.  Past Medical History:  Diagnosis Date  . Abnormal Pap smear of cervix    mid 40s to 50s   . Allergy   . Anemia   . Brittle nails   . Chicken pox   . Chronic kidney disease    CKD 4/borderline 5   . 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 Emphysema of lung (HCC)    chronic bronchitis   . Fibrocystic breast changes   . Glaucoma    [name] [name]  . Heart murmur   . Hyperlipidemia   . Hypertension   . Measles    childhood  . Secondary hyperparathyroidism ([place])   . Shoulder pain   . Sleep apnea   . Thyroid disease    hypothyroidism     Patient Active Problem List   Diagnosis Date  Noted  . Noncompliance with medications 10/15/2018  . CKD (chronic kidney disease) stage 5, GFR less than 15 ml/min (HCC) 02/16/2018  . Grief 05/25/2017  . Bradycardia 05/25/2017  . Chronic venous insufficiency 05/12/2016  . Lymphedema 05/12/2016  . Venous stasis dermatitis of right lower extremity 05/12/2016  . Essential hypertension 03/14/2015  . Allergic rhinitis with postnasal drip 03/14/2015  . Dysuria 03/14/2015  . Glaucoma 03/14/2015  . Polycystic kidney disease 03/14/2015  . 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 Hypothyroidism 03/14/2015  . Hyperlipidemia 03/14/2015  . Prediabetes 03/14/2015  . Influenza vaccination declined by patient 03/14/2015    Past Surgical History:  Procedure Laterality Date  . ABDOMINAL HYSTERECTOMY     total 2/2 fibroids in [place] CYST REMOVAL  12/2012   Performed at [facility]  . UTERINE FIBROID SURGERY     In [place] several years ago  . VARICOSE VEIN SURGERY     left with h/o abnormal abi   . VEIN LIGATION AND STRIPPING Left    Performed in [place]     OB History   No obstetric history on file.      Home Medications    Prior to Admission medications   Medication Sig Start Date End Date Taking? Authorizing Provider  carvedilol (COREG) 6.25 MG tablet Take 1 tablet (6.25  mg total) by mouth 2 (two) times daily with a meal. 02/16/18  Yes [name], Nino Glow, MD  dorzolamide-timolol (COSOPT) 22.3-6.8 MG/ML ophthalmic solution Place 1 drop into both eyes every morning.    Yes [provider]  Latanoprostene Bunod (VYZULTA) 0.024 % SOLN Apply 1 drop to eye at bedtime. Both eyes per [name] Patient taking differently: Place 1 drop into both eyes at bedtime. Both eyes per [name] 06/01/17  Yes [name], Nino Glow, MD  Multiple Vitamin (MULTIVITAMIN WITH MINERALS) TABS tablet Take 1 tablet by mouth daily with lunch.   Yes [provider]  ezetimibe (ZETIA) 10  MG tablet Take 1 tablet (10 mg total) by mouth daily. Patient not taking: Reported on 03/18/2019 06/16/18   [name], Nino Glow, MD  furosemide (LASIX) 40 MG tablet Take 1 tablet (40 mg total) by mouth daily as needed. Patient not taking: Reported on 03/18/2019 10/15/18   [name], Nino Glow, MD  hydrALAZINE (APRESOLINE) 25 MG tablet Take 1 tablet (25 mg total) by mouth 2 (two) times daily. Patient not taking: Reported on 03/18/2019 06/16/18   [name], Nino Glow, MD    Family History Family History  Problem Relation Age of Onset  . Hypertension Mother   . Diabetes Mother   . Arthritis Mother   . Cancer Mother   . Hypertension Father   . Cancer Father        colon, prostate  . Stroke Father        complications from HTN  . Alcohol abuse Brother   . COPD Brother   . Depression Brother   . Drug abuse Brother   . Early death Brother   . Mental illness Daughter   . Diabetes Son   . Alcohol abuse Brother   . Drug abuse Brother   . Early death Brother   . Cystic kidney disease Other   . Kidney failure Cousin   . Sickle cell anemia Other     Social History Social History   Tobacco Use  . Smoking status: Never Smoker  . Smokeless tobacco: Never Used  Substance Use Topics  . Alcohol use: No    Alcohol/week: 0.0 standard drinks  . Drug use: No     Allergies   Citrus   Review of Systems Review of Systems  Constitutional: Negative for chills and fever.  Respiratory: Negative for cough and shortness of breath.   Cardiovascular: Negative for chest pain and palpitations.  Gastrointestinal: Negative for abdominal pain, diarrhea, nausea and vomiting.  Genitourinary: Negative for difficulty urinating, dysuria and hematuria.  Neurological: Negative for dizziness, speech difficulty, weakness and numbness.  Psychiatric/Behavioral: Negative for hallucinations.  All other systems reviewed and are negative.    Physical Exam Updated Vital Signs BP (!) 183/87 (BP  Location: Left Arm)   Pulse 62   Temp 98.3 F (36.8 C) (Oral)   Resp 12   SpO2 100%   Physical Exam Vitals signs and nursing note reviewed.  Constitutional:      General: She is not in acute distress.    Appearance: She is not ill-appearing.  HENT:     Head: Normocephalic.  Eyes:     Pupils: Pupils are equal, round, and reactive to light.  Neck:     Musculoskeletal: Normal range of motion and neck supple.  Cardiovascular:     Rate and Rhythm: Normal rate and regular rhythm.     Pulses: Normal pulses.     Heart sounds: Normal heart sounds.  No murmur. No friction rub. No gallop.   Pulmonary:     Effort: Pulmonary effort is normal.     Breath sounds: Normal breath sounds.     Comments: Clear to auscultation bilaterally Abdominal:     General: Abdomen is flat. Bowel sounds are normal. There is no distension.     Palpations: Abdomen is soft.     Tenderness: There is no abdominal tenderness. There is no right CVA tenderness, left CVA tenderness, guarding or rebound.  Musculoskeletal: Normal range of motion.  Skin:    General: Skin is warm and dry.     Capillary Refill: Capillary refill takes less than 2 seconds.  Neurological:     General: No focal deficit present.     Mental Status: She is alert and oriented to person, place, and time.     Comments: Neurological:  Mental Status: Alert, oriented, thought content appropriate. Speech fluent without evidence of aphasia. Able to follow 2 step commands without difficulty.  Cranial Nerves:  III,IV, VI: Peripheral visual fields grossly normal, pupils equal, round, reactive to lightptosis not present, extra-ocular motions intact bilaterally  V,VII: smile symmetric, facial light touch sensation equal VIII: hearing grossly normal bilaterally  IX,X: midline uvula rise  XI: bilateral shoulder shrug equal and strong XII: midline tongue extension  Motor:  5/5 in upper and lower extremities bilaterally including strong and equal grip  strength and dorsiflexion/plantar flexion Sensory: light touch normal in all extremities.  Gait: normal gait and balance       ED Treatments / Results  Labs (all labs ordered are listed, but only abnormal results are displayed) Labs Reviewed  BASIC METABOLIC PANEL - Abnormal; Notable for the following components:      Result Value   CO2 19 (*)    BUN 53 (*)    Creatinine, Ser 4.98 (*)    Calcium 8.8 (*)    GFR calc non Af Amer 8 (*)    GFR calc Af Amer 9 (*)    All other components within normal limits  CBC - Abnormal; Notable for the following components:   RBC 3.66 (*)    Hemoglobin 11.3 (*)    HCT 34.9 (*)    All other components within normal limits  URINALYSIS, ROUTINE W REFLEX MICROSCOPIC - Abnormal; Notable for the following components:   Hgb urine dipstick SMALL (*)    Protein, ur 30 (*)    Bacteria, UA FEW (*)    All other components within normal limits  TROPONIN I (HIGH SENSITIVITY) - Abnormal; Notable for the following components:   Troponin I (High Sensitivity) 19 (*)    All other components within normal limits  TROPONIN I (HIGH SENSITIVITY) - Abnormal; Notable for the following components:   Troponin I (High Sensitivity) 27 (*)    All other components within normal limits  TSH  CBG MONITORING, ED    EKG None  Radiology Ct Head Wo Contrast  Result Date: 03/18/2019 CLINICAL DATA:  Altered mental status, confusion EXAM: CT HEAD WITHOUT CONTRAST TECHNIQUE: Contiguous axial images were obtained from the base of the skull through the vertex without intravenous contrast. COMPARISON:  02/16/2019 FINDINGS: Brain: No evidence of acute infarction, hemorrhage, hydrocephalus, extra-axial collection or mass lesion/mass effect. Scattered low-density changes within the periventricular and subcortical white matter compatible with chronic microvascular ischemic change. Mild diffuse cerebral volume loss. Vascular: No hyperdense vessel or unexpected calcification. Skull:  Normal. Negative for fracture or focal lesion. Sinuses/Orbits: No acute finding. Other: None. IMPRESSION:  1.  No acute intracranial findings. 2.  Chronic microvascular ischemic change and cerebral volume loss. Electronically Signed   By: Davina Poke M.D.   On: 03/18/2019 20:42    Procedures Procedures (including critical care time)  Medications Ordered in ED Medications  sodium chloride flush (NS) 0.9 % injection 3 mL (has no administration in time range)  carvedilol (COREG) tablet 6.25 mg (6.25 mg Oral Given 03/18/19 1900)     Initial Impression / Assessment and Plan / ED Course  I have reviewed the triage vital signs and the nursing notes.  Pertinent labs & imaging results that were available during my care of the patient were reviewed by me and considered in my medical decision making (see chart for details).  Clinical Course as of Mar 18 6  Fri Mar 18, 2019  2241 Troponin I (High Sensitivity)(!): 27 [CC]    Clinical Course User Index [CC] Atilano Median, Comer Locket, PA-C       Turner Kunzman is a 73 year old female who presents to the ED via EMS after her daughter called 911 because patient was "confused". Patient does not know why daughter called EMS. She is on blood pressure medication which she has not taken for the past 2-3 days. Upon arrival, patient's blood pressure was 214/85. All other vitals WNL. On physical exam, patient is in no acute distress, alert, and oriented x 4. Full neurological exam unremarkable. Lungs clear to auscultation bilaterally. Heart sounds normal with no murmurs. Will work up for AMS to rule out common causes of AMS such as UTI, infection, intracranial hemorrhage, liver dysfunction, and metabolic causes. Will give patient's daily dose of carvedilol, but suspect patient has had elevated blood pressure for a little while. Patient is asymptomatic. No signs or symptoms of hypertensive emergency.   Initial troponin elevated at 19. Will order serial troponin,  but suspect troponin is elevated due to CKD. Chart review shows troponin 1 month ago was elevated at Creatinine mildly elevated from baseline at 4.98. GFR at baseline. Hemoglobin decreased, but baseline. TSH unremarkable. UA with no signs of infection. Positive for protein. Suspect proteinuria is due to CKD. EKG sinus rhythm with no signs of ischemia.   Spoke to daughter at bedside. Daughter states her mother has been confused for the past few months typically worse in the afternoon. Suspect underlying dementia. Daughter notes that patient won't take her medicine or go to doctor's appointments so she is worried. Will place an order for home health to visit patient at house.  CT scan demonstrated no acute intracranial findings, but did show chronic microvascular ischemic changes and cerebral volume loss which is probably the cause of her months of confusion. Patient had similar workup on 02/16/2019 with unremarkable findings. Suspect patient's issue are chronic in nature.   Second troponin elevated at 27. Given it did not increase by 20 and her troponin was elevated a month ago, she has a history of CKD, terrible HTN, and not complaining of chest pain- do not suspect elevated troponin represents ACS. Will discharge patient home with close follow-up with PCP. Instructed patient the need to take blood pressure medication. Will give patient outpatient neurologist for further dementia workup. Strict ED return precautions discussed with patient. Patient states understanding and agrees with plan. Patient discharged home with daughter in no acute distress. Blood pressure is still elevated, but no signs or symptoms of hypertensive emergency.   Discussed case with Dr. Lita Mains who agrees with plan.   Final Clinical Impressions(s) /  ED Diagnoses   Final diagnoses:  Altered mental status, unspecified altered mental status type    ED Discharge Orders         [place]     03/18/19 2241     Face-to-face encounter (required for Medicare/Medicaid patients)    Comments: I Jonette Eva certify that this patient is under my care and that I, or a nurse practitioner or physician's assistant working with me, had a face-to-face encounter that meets the physician face-to-face encounter requirements with this patient on 03/18/2019. The encounter with the patient was in whole, or in part for the following medical condition(s) which is the primary reason for home health care (List medical condition):   Patient is noncompliant with medication. Signs of early dementia. Needs help organizing her medication.   03/18/19 [place], Chadley Dziedzic B, PA-C 03/19/19 0014    Julianne Rice, MD 03/19/19 [id number]

## 2019-03-18 NOTE — ED Notes (Signed)
Sent a urine culture with the urine specimen 

## 2019-03-18 NOTE — ED Notes (Signed)
Patient transported to CT 

## 2019-03-18 NOTE — Discharge Instructions (Addendum)
As discussed, I have given you a number for a neurologist. Please call on Monday and make an appointment. You should be receiving a phone call from home health for some assistance at home. Continue taking all your medications as prescribed. Follow-up with your PCP within the next week. Please return to the ER if you have worsening or new symptoms.

## 2019-03-19 ENCOUNTER — Telehealth: Payer: Self-pay | Admitting: *Deleted

## 2019-03-19 NOTE — Telephone Encounter (Signed)
TOC CM referral received to arrange [facility]. Contacted pt and states she prefers CM call her on Monday, 03/21/2019. She wanted to think everything through before agreeing to [facility]. CM requested permission to call dtr. Pt did not give CM permission to speak to dtr at this time. Will give pt a call on Monday to discuss [place]. EDP, [employer] PA updated.   [place], [place] ED TOC CM [phone]

## 2019-03-21 DIAGNOSIS — N185 Chronic kidney disease, stage 5: Secondary | ICD-10-CM | POA: Diagnosis not present

## 2019-03-22 ENCOUNTER — Telehealth: Payer: Self-pay | Admitting: Internal Medicine

## 2019-03-22 ENCOUNTER — Other Ambulatory Visit: Payer: Self-pay | Admitting: Internal Medicine

## 2019-03-22 DIAGNOSIS — R413 Other amnesia: Secondary | ICD-10-CM

## 2019-03-22 DIAGNOSIS — R4182 Altered mental status, unspecified: Secondary | ICD-10-CM

## 2019-03-22 NOTE — Telephone Encounter (Signed)
Per ER notes wanting patient to have neurology work up for dementia .

## 2019-03-22 NOTE — Telephone Encounter (Signed)
Referral sent Dr. Manuella Ghazi

## 2019-03-22 NOTE — Telephone Encounter (Signed)
Copied from [place] [id number]. Topic: General - Other >> Mar 22, 2019 10:29 AM Keene Breath wrote: Reason for CRM: Patient called to request a referral to see a neurologist per the suggestion from the ER.  Please advise and call patient when this has been approved.  CB# [phone]

## 2019-03-31 ENCOUNTER — Encounter: Payer: Self-pay | Admitting: Obstetrics and Gynecology

## 2019-03-31 ENCOUNTER — Other Ambulatory Visit: Payer: Self-pay

## 2019-03-31 ENCOUNTER — Ambulatory Visit (INDEPENDENT_AMBULATORY_CARE_PROVIDER_SITE_OTHER): Payer: Medicare Other | Admitting: Obstetrics and Gynecology

## 2019-03-31 ENCOUNTER — Telehealth: Payer: Self-pay

## 2019-03-31 VITALS — BP 175/93 | HR 67 | Ht 63.0 in | Wt 159.5 lb

## 2019-03-31 DIAGNOSIS — B373 Candidiasis of vulva and vagina: Secondary | ICD-10-CM

## 2019-03-31 DIAGNOSIS — F0390 Unspecified dementia without behavioral disturbance: Secondary | ICD-10-CM

## 2019-03-31 DIAGNOSIS — B3731 Acute candidiasis of vulva and vagina: Secondary | ICD-10-CM

## 2019-03-31 MED ORDER — FLUCONAZOLE 150 MG PO TABS: 150.0000 mg | ORAL_TABLET | ORAL | 3 refills | Status: DC

## 2019-03-31 NOTE — Telephone Encounter (Signed)
[facility] --- (Not the one on chart).  Please call in today's meds to this pharmacy this time please.

## 2019-03-31 NOTE — Progress Notes (Signed)
HPI:      Ms. Holly Mcgee is a 73 y.o. No obstetric history on file. who LMP was No LMP recorded. Patient has had a hysterectomy.   Subjective:   She presents today but she is unsure why she is here today.  When questioned in some detail she says that she has some burning on the outside.  She was seen in the emergency department within the last 2 weeks with worsening dementia seemingly confirmed by CT scan. She has some obvious dementia issues because when we asked her to get ready for the examination she repeatedly forgot to get ready.  After returning to the room she forgot that I was Dr. Amalia Hailey who had just been in there 5 minutes earlier and she asked where Dr. Amalia Hailey was. We were able to phone the daughter who came in to the appointment and was able to assist with consent as well as some additional history.    Hx: The following portions of the patient's history were reviewed and updated as appropriate:             She  has a past medical history of Abnormal Pap smear of cervix, Allergy, Anemia, Brittle nails, Chicken pox, Chronic kidney disease, CKD (chronic kidney disease), Emphysema of lung ([place]), Fibrocystic breast changes, Glaucoma, Heart murmur, Hyperlipidemia, Hypertension, Measles, Secondary hyperparathyroidism ([place]), Shoulder pain, Sleep apnea, and Thyroid disease. She does not have any pertinent problems on file. She  has a past surgical history that includes Kidney cyst removal (12/2012); Uterine fibroid surgery; Vein ligation and stripping (Left); Abdominal hysterectomy; and Varicose vein surgery. Her family history includes Alcohol abuse in her brother and brother; Arthritis in her mother; COPD in her brother; Cancer in her father and mother; Cystic kidney disease in an other family member; Depression in her brother; Diabetes in her mother and son; Drug abuse in her brother and brother; Early death in her brother and brother; Hypertension in her father and mother; Kidney failure in  her cousin; Mental illness in her daughter; Sickle cell anemia in an other family member; Stroke in her father. She  reports that she has never smoked. She has never used smokeless tobacco. She reports that she does not drink alcohol or use drugs. She has a current medication list which includes the following prescription(s): carvedilol, dorzolamide-timolol, ezetimibe, furosemide, hydralazine, latanoprostene bunod, and multivitamin with minerals. She is allergic to citrus.       Review of Systems:  Review of Systems  Constitutional: Denied constitutional symptoms, night sweats, recent illness, fatigue, fever, insomnia and weight loss.  Eyes: Denied eye symptoms, eye pain, photophobia, vision change and visual disturbance.  Ears/Nose/Throat/Neck: Denied ear, nose, throat or neck symptoms, hearing loss, nasal discharge, sinus congestion and sore throat.  Cardiovascular: Denied cardiovascular symptoms, arrhythmia, chest pain/pressure, edema, exercise intolerance, orthopnea and palpitations.  Respiratory: Denied pulmonary symptoms, asthma, pleuritic pain, productive sputum, cough, dyspnea and wheezing.  Gastrointestinal: Denied, gastro-esophageal reflux, melena, nausea and vomiting.  Genitourinary: Denied genitourinary symptoms including symptomatic vaginal discharge, pelvic relaxation issues, and urinary complaints.  Musculoskeletal: Denied musculoskeletal symptoms, stiffness, swelling, muscle weakness and myalgia.  Dermatologic: Denied dermatology symptoms, rash and scar.  Neurologic: Denied neurology symptoms, dizziness, headache, neck pain and syncope.  Psychiatric:  Dementia.  Endocrine: Denied endocrine symptoms including hot flashes and night sweats.   Meds:   Current Outpatient Medications on File Prior to Visit  Medication Sig Dispense Refill  . carvedilol (COREG) 6.25 MG tablet Take 1 tablet (6.25 mg  total) by mouth 2 (two) times daily with a meal. 180 tablet 3  . dorzolamide-timolol  (COSOPT) 22.3-6.8 MG/ML ophthalmic solution Place 1 drop into both eyes every morning.     . ezetimibe (ZETIA) 10 MG tablet Take 1 tablet (10 mg total) by mouth daily. 90 tablet 3  . furosemide (LASIX) 40 MG tablet Take 1 tablet (40 mg total) by mouth daily as needed. 90 tablet 3  . hydrALAZINE (APRESOLINE) 25 MG tablet Take 1 tablet (25 mg total) by mouth 2 (two) times daily. 180 tablet 3  . Latanoprostene Bunod (VYZULTA) 0.024 % SOLN Apply 1 drop to eye at bedtime. Both eyes per [name] (Patient taking differently: Place 1 drop into both eyes at bedtime. Both eyes per [name]) 1 Bottle 0  . Multiple Vitamin (MULTIVITAMIN WITH MINERALS) TABS tablet Take 1 tablet by mouth daily with lunch.     No current facility-administered medications on file prior to visit.     Objective:     Vitals:   03/31/19 1550  BP: (!) 175/93  Pulse: 67              Physical examination   Pelvic:   Vulva:  Erythematous thick white vaginal discharge  Vagina: No lesions or abnormalities noted.  Support: Normal pelvic support.  Urethra No masses tenderness or scarring.  Meatus Normal size without lesions or prolapse.  Cervix:  Surgically absent.  Anus: Normal exam.  No lesions.  Perineum: Normal exam.  No lesions.        Bimanual   Uterus:  Surgically absent  Adnexae: No masses.  Non-tender to palpation.  Cul-de-sac: Negative for abnormality.   WET PREP: clue cells: absent, KOH (yeast): positive, odor: absent and trichomoniasis: negative Ph:  < 4.5   Assessment:    No obstetric history on file. Patient Active Problem List   Diagnosis Date Noted  . Noncompliance with medications 10/15/2018  . CKD (chronic kidney disease) stage 5, GFR less than 15 ml/min (HCC) 02/16/2018  . Grief 05/25/2017  . Bradycardia 05/25/2017  . Chronic venous insufficiency 05/12/2016  . Lymphedema 05/12/2016  . Venous stasis dermatitis of right lower extremity 05/12/2016  . Essential hypertension  03/14/2015  . Allergic rhinitis with postnasal drip 03/14/2015  . Dysuria 03/14/2015  . Glaucoma 03/14/2015  . Polycystic kidney disease 03/14/2015  . 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 Hypothyroidism 03/14/2015  . Hyperlipidemia 03/14/2015  . Prediabetes 03/14/2015  . Influenza vaccination declined by patient 03/14/2015     1. Monilial vulvovaginitis   2. Dementia without behavioral disturbance, unspecified dementia type [facility])     [name] daughter states that she was recently treated with antibiotics for a bladder infection.   Plan:            1.  Diflucan Orders No orders of the defined types were placed in this encounter.   No orders of the defined types were placed in this encounter.     F/U  No follow-ups on file.  Finis Bud, M.D. 03/31/2019 4:29 PM

## 2019-04-01 NOTE — Telephone Encounter (Signed)
This was the pharmacy on file. Dr. Amalia Hailey sent the medication to the pharmacy.

## 2019-04-12 ENCOUNTER — Telehealth: Payer: Self-pay

## 2019-04-12 NOTE — Telephone Encounter (Signed)
Copied from [place] [id number]. Topic: General - Inquiry >> Apr 12, 2019 11:17 AM Virl Axe D wrote: Reason for CRM: Pt stated she would like to have a colonoscopy as soon as possible. She would like to for Dr. Aundra Dubin to send a referral if needed to [facility]. Please advise. Pt is also contacting [facility]

## 2019-04-13 ENCOUNTER — Other Ambulatory Visit: Payer: Self-pay | Admitting: Internal Medicine

## 2019-04-13 DIAGNOSIS — K635 Polyp of colon: Secondary | ICD-10-CM

## 2019-04-13 DIAGNOSIS — Z1211 Encounter for screening for malignant neoplasm of colon: Secondary | ICD-10-CM

## 2019-04-13 NOTE — Telephone Encounter (Signed)
Referral sen t  [place]

## 2019-04-20 ENCOUNTER — Encounter: Payer: Self-pay | Admitting: Physician Assistant

## 2019-04-26 ENCOUNTER — Telehealth: Payer: Self-pay | Admitting: Internal Medicine

## 2019-04-26 NOTE — Telephone Encounter (Signed)
I called pt twice and left vm to call ofc. °

## 2019-04-27 ENCOUNTER — Encounter: Payer: Self-pay | Admitting: Internal Medicine

## 2019-04-27 ENCOUNTER — Other Ambulatory Visit: Payer: Self-pay

## 2019-04-27 ENCOUNTER — Ambulatory Visit (INDEPENDENT_AMBULATORY_CARE_PROVIDER_SITE_OTHER): Payer: Medicare Other | Admitting: Internal Medicine

## 2019-04-27 VITALS — BP 163/63 | HR 63 | Ht 63.0 in | Wt 159.8 lb

## 2019-04-27 DIAGNOSIS — I120 Hypertensive chronic kidney disease with stage 5 chronic kidney disease or end stage renal disease: Secondary | ICD-10-CM | POA: Diagnosis not present

## 2019-04-27 DIAGNOSIS — I10 Essential (primary) hypertension: Secondary | ICD-10-CM | POA: Diagnosis not present

## 2019-04-27 DIAGNOSIS — Z1231 Encounter for screening mammogram for malignant neoplasm of breast: Secondary | ICD-10-CM

## 2019-04-27 DIAGNOSIS — R4182 Altered mental status, unspecified: Secondary | ICD-10-CM | POA: Diagnosis not present

## 2019-04-27 DIAGNOSIS — N2581 Secondary hyperparathyroidism of renal origin: Secondary | ICD-10-CM | POA: Diagnosis not present

## 2019-04-27 DIAGNOSIS — N185 Chronic kidney disease, stage 5: Secondary | ICD-10-CM

## 2019-04-27 DIAGNOSIS — Q613 Polycystic kidney, unspecified: Secondary | ICD-10-CM | POA: Diagnosis not present

## 2019-04-27 MED ORDER — HYDRALAZINE HCL 25 MG PO TABS: 25.0000 mg | ORAL_TABLET | Freq: Two times a day (BID) | ORAL | 3 refills | Status: DC

## 2019-04-27 MED ORDER — DILTIAZEM HCL ER COATED BEADS 120 MG PO CP24: 120.0000 mg | ORAL_CAPSULE | Freq: Every day | ORAL | 3 refills | Status: DC

## 2019-04-27 MED ORDER — FUROSEMIDE 40 MG PO TABS: 40.0000 mg | ORAL_TABLET | Freq: Every day | ORAL | 3 refills | Status: DC | PRN

## 2019-04-27 MED ORDER — CARVEDILOL 6.25 MG PO TABS: 6.2500 mg | ORAL_TABLET | Freq: Two times a day (BID) | ORAL | 3 refills | Status: DC

## 2019-04-27 NOTE — Progress Notes (Signed)
Virtual Visit via Video Note  I connected with Holly Mcgee  on 04/27/19 at  8:00 AM EST by a video enabled telemedicine application and verified that I am speaking with the correct person using two identifiers.  Location patient: home Location provider:work or home office Persons participating in the virtual visit: patient, provider, daughter Stephenie Acres   I discussed the limitations of evaluation and management by telemedicine and the availability of in person appointments. The patient expressed understanding and agreed to proceed.   HPI: 1. HTN with CKD 163/63 on coreg 6.25 mg bid lasix 40 mg qd prn hydralazine 25 mg bid not taking  2. Confusion pt denies confusion daughter reports episodes appt Dr. Manuella Ghazi 05/19/2019 at 9:30 am  3. CKD 4/5 not compliant at times with HTN meds stressed importance of compliance  4. Wt loss she is eating less down from 180s >1 year ago she reports she is eating less appt with GI and agreeable to mammogram    ROS: See pertinent positives and negatives per HPI.  Past Medical History:  Diagnosis Date  . Abnormal Pap smear of cervix    mid 40s to 50s   . Allergy   . Anemia   . Brittle nails   . Chicken pox   . Chronic kidney disease    CKD 4/borderline 5   . 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 Emphysema of lung (HCC)    chronic bronchitis   . Fibrocystic breast changes   . Glaucoma    [name] [name]  . Heart murmur   . Hyperlipidemia   . Hypertension   . Measles    childhood  . Secondary hyperparathyroidism ([place])   . Shoulder pain   . Sleep apnea   . Thyroid disease    hypothyroidism     Past Surgical History:  Procedure Laterality Date  . ABDOMINAL HYSTERECTOMY     total 2/2 fibroids in [place] CYST REMOVAL  12/2012   Performed at [facility]  . UTERINE FIBROID SURGERY     In [place] several years ago  . VARICOSE VEIN SURGERY     left with h/o abnormal abi   . VEIN LIGATION AND STRIPPING Left    Performed in  [place]    Family History  Problem Relation Age of Onset  . Hypertension Mother   . Diabetes Mother   . Arthritis Mother   . Cancer Mother   . Hypertension Father   . Cancer Father        colon, prostate  . Stroke Father        complications from HTN  . Alcohol abuse Brother   . COPD Brother   . Depression Brother   . Drug abuse Brother   . Early death Brother   . Mental illness Daughter   . Diabetes Son   . Alcohol abuse Brother   . Drug abuse Brother   . Early death Brother   . Cystic kidney disease Other   . Kidney failure Cousin   . 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  carvedilol (COREG) 6.25 MG tablet, Take 1 tablet (6.25 mg total) by mouth  2 (two) times daily with a meal., Disp: 180 tablet, Rfl: 3 .  dorzolamide-timolol (COSOPT) 22.3-6.8 MG/ML ophthalmic solution, Place 1 drop into both eyes every morning. , Disp: , Rfl:  .  ezetimibe (ZETIA) 10 MG tablet, Take 1 tablet (10 mg total) by mouth daily., Disp: 90 tablet, Rfl: 3 .  furosemide (LASIX) 40 MG tablet, Take 1 tablet (40 mg total) by mouth daily as needed., Disp: 90 tablet, Rfl: 3 .  hydrALAZINE (APRESOLINE) 25 MG tablet, Take 1 tablet (25 mg total) by mouth 2 (two) times daily., Disp: 180 tablet, Rfl: 3 .  Latanoprostene Bunod (VYZULTA) 0.024 % SOLN, Apply 1 drop to eye at bedtime. Both eyes per [name] (Patient taking differently: Place 1 drop into both eyes at bedtime. Both eyes per [name]), Disp: 1 Bottle, Rfl: 0 .  Multiple Vitamin (MULTIVITAMIN WITH MINERALS) TABS tablet, Take 1 tablet by mouth daily with lunch., Disp: , Rfl:  .  diltiazem (CARDIZEM CD) 120 MG 24 hr capsule, Take 1 capsule (120 mg total) by mouth daily., Disp: 90 capsule, Rfl:  3  EXAM:  VITALS per patient if applicable:  GENERAL: alert, oriented, appears well and in no acute distress  HEENT: atraumatic, conjunttiva clear, no obvious abnormalities on inspection of external nose and ears  NECK: normal movements of the head and neck  LUNGS: on inspection no signs of respiratory distress, breathing rate appears normal, no obvious gross SOB, gasping or wheezing  CV: no obvious cyanosis  MS: moves all visible extremities without noticeable abnormality  PSYCH/NEURO: pleasant and cooperative, no obvious depression or anxiety, speech and thought processing grossly intact  ASSESSMENT AND PLAN:  Discussed the following assessment and plan:  Essential hypertension - Plan: hydrALAZINE (APRESOLINE) 25 MG tablet, furosemide (LASIX) 40 MG tablet, carvedilol (COREG) 6.25 MG tablet, diltiazem (CARDIZEM CD) 120 MG 24 hr capsule will try this 1st instead of norvasc 5 due to edema and proteinuria  Pt to my chart or call back in 1 week   CKD (chronic kidney disease) stage 5, GFR less than 15 ml/min (HCC) - Plan: furosemide (LASIX) 40 MG tablet qd not prn  Pt to f/u renal in [name]   Altered mental status, unspecified altered mental status type  appt neurology sch 05/19/2019 9:30 Dr. Manuella Ghazi etiology may be multifactorial    HM Declines flu shot MMR immune rec hep b vaccine not immunedisc'edprevpt wants to think about it Tdap had 10/19/13 Dr. Heath Gold Will discshingrixin future Prevnar had 09/14/17  pna 23 10/19/13  Declines further mammograms though last 11/27/15 [facility] with b/l asymmetries -referred today  Last DEXA in [place] years ago agreeable to repeat ordered today -sch dexa as well   Pap out of age window  Colonoscopy 8/4/14colonpolyps will disc with pt If wants repeat in 12/2017  -referred colonoscopyprev pt to schedule will callwants to see Helen in [place] never had colonoscopy scheduled  appt sch 05/06/2019 [facility] [place]   Never smoker Pt reports  [facility] Venetia Maxon yet  Renal appt 05/19/18 Dr. Pearson [place] CKD 5 pt wants to do conservative care as long as possible BP 160/87 on BB and lasix  -we discussed possible serious and likely etiologies, options for evaluation and workup, limitations of telemedicine visit vs in person visit, treatment, treatment risks and precautions. Pt prefers to treat via telemedicine empirically rather then risking or undertaking an in person visit at this moment. Patient agrees to seek prompt  in person care if worsening, new symptoms arise, or if is not improving with treatment.   I discussed the assessment and treatment plan with the patient. The patient was provided an opportunity to ask questions and all were answered. The patient agreed with the plan and demonstrated an understanding of the instructions.   The patient was advised to call back or seek an in-person evaluation if the symptoms worsen or if the condition fails to improve as anticipated.  Time spent 25 minutes  Delorise Jackson, MD

## 2019-05-06 ENCOUNTER — Other Ambulatory Visit: Payer: Self-pay

## 2019-05-06 ENCOUNTER — Encounter: Payer: Self-pay | Admitting: Physician Assistant

## 2019-05-06 ENCOUNTER — Telehealth: Payer: Self-pay | Admitting: Internal Medicine

## 2019-05-06 ENCOUNTER — Ambulatory Visit (INDEPENDENT_AMBULATORY_CARE_PROVIDER_SITE_OTHER): Payer: Medicare Other | Admitting: Physician Assistant

## 2019-05-06 VITALS — BP 172/90 | HR 69 | Temp 98.7°F | Ht 63.0 in | Wt 157.0 lb

## 2019-05-06 DIAGNOSIS — Z1159 Encounter for screening for other viral diseases: Secondary | ICD-10-CM | POA: Diagnosis not present

## 2019-05-06 DIAGNOSIS — Z86010 Personal history of colonic polyps: Secondary | ICD-10-CM | POA: Diagnosis not present

## 2019-05-06 MED ORDER — NA SULFATE-K SULFATE-MG SULF 17.5-3.13-1.6 GM/177ML PO SOLN: 1.0000 | Freq: Once | ORAL | 0 refills | Status: AC

## 2019-05-06 NOTE — Patient Instructions (Signed)
If you are age 73 or older, your body mass index should be between 23-30. Your Body mass index is 27.81 kg/m. If this is out of the aforementioned range listed, please consider follow up with your Primary Care Provider.  If you are age 42 or younger, your body mass index should be between 19-25. Your Body mass index is 27.81 kg/m. If this is out of the aformentioned range listed, please consider follow up with your Primary Care Provider.   We have sent the following medications to your pharmacy for you to pick up at your convenience:   [place] have been scheduled for a colonoscopy. Please follow written instructions given to you at your visit today.  Please pick up your prep supplies at the pharmacy within the next 1-3 days.he procedure.  Thank you for choosing me and [facility] Ellouise Newer, PA-C

## 2019-05-06 NOTE — Telephone Encounter (Signed)
Call pt and ask is she taking all bp meds as prescribed?  Cardizem, hydralazine, coreg?   BP too high at GI appt  [place]

## 2019-05-06 NOTE — Telephone Encounter (Signed)
Left message for patient to return call back. [facility] may give and obtain information.  

## 2019-05-06 NOTE — Progress Notes (Signed)
I agree with the above note, plan 

## 2019-05-06 NOTE — Progress Notes (Signed)
Chief Complaint: History of colon polyps  HPI:    Holly Mcgee is a 73 year old female with past medical history as listed below, who was referred to me by [name], [name] * for history of colon polyps.    (12/31/2012 colonoscopy at [facility] with hyperplastic polyps in patient's chart)    Today, the patient resents to clinic and explains that she has a history of colon polyps and has not had a colonoscopy for at least 80 to 14 years and the last one was done in [place].  (Of note there is a colonoscopy at [facility] in patient's chart but patient denies ever having had this done.)  Patient does tell me she is doing well otherwise.  Apparently has been losing some weight but is abiding by new diet given her kidney disease.  Tells me she eats much healthier now.    Family history of colon cancer in her mother.    Denies fever, chills, weight loss, blood in her stool, change in bowel habits or abdominal pain.  Past Medical History:  Diagnosis Date  . Abnormal Pap smear of cervix    mid 40s to 50s   . Allergy   . Anemia   . Brittle nails   . Chicken pox   . Chronic kidney disease    CKD 4/borderline 5   . 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 Emphysema of lung (HCC)    chronic bronchitis   . Fibrocystic breast changes   . Glaucoma    [name] [name]  . Heart murmur   . Hyperlipidemia   . Hypertension   . Measles    childhood  . Secondary hyperparathyroidism ([place])   . Shoulder pain   . Sleep apnea   . Thyroid disease    hypothyroidism     Past Surgical History:  Procedure Laterality Date  . ABDOMINAL HYSTERECTOMY     total 2/2 fibroids in [name] CYST REMOVAL  12/2012   Performed at [facility]  . UTERINE FIBROID SURGERY     In [place] several years ago  . VARICOSE VEIN SURGERY     left with h/o abnormal abi   . VEIN LIGATION AND STRIPPING Left    Performed in [place]    Current Outpatient Medications  Medication Sig Dispense Refill  . carvedilol  (COREG) 6.25 MG tablet Take 1 tablet (6.25 mg total) by mouth 2 (two) times daily with a meal. 180 tablet 3  . diltiazem (CARDIZEM CD) 120 MG 24 hr capsule Take 1 capsule (120 mg total) by mouth daily. 90 capsule 3  . dorzolamide-timolol (COSOPT) 22.3-6.8 MG/ML ophthalmic solution Place 1 drop into both eyes every morning.     . ezetimibe (ZETIA) 10 MG tablet Take 1 tablet (10 mg total) by mouth daily. 90 tablet 3  . furosemide (LASIX) 40 MG tablet Take 1 tablet (40 mg total) by mouth daily as needed. 90 tablet 3  . hydrALAZINE (APRESOLINE) 25 MG tablet Take 1 tablet (25 mg total) by mouth 2 (two) times daily. 180 tablet 3  . Latanoprostene Bunod (VYZULTA) 0.024 % SOLN Apply 1 drop to eye at bedtime. Both eyes per [name] (Patient taking differently: Place 1 drop into both eyes at bedtime. Both eyes per [name]) 1 Bottle 0  . Multiple Vitamin (MULTIVITAMIN WITH MINERALS) TABS tablet Take 1 tablet by mouth daily with lunch.     No current facility-administered  medications for this visit.     Allergies as of 05/06/2019 - Review Complete 05/06/2019  Allergen Reaction Noted  . Citrus Itching and Swelling 03/14/2015    Family History  Problem Relation Age of Onset  . Hypertension Mother   . Diabetes Mother   . Arthritis Mother   . Cancer Mother   . Hypertension Father   . Cancer Father        colon, prostate  . Stroke Father        complications from HTN  . Alcohol abuse Brother   . COPD Brother   . Depression Brother   . Drug abuse Brother   . Early death Brother   . Mental illness Daughter   . Diabetes Son   . Alcohol abuse Brother   . Drug abuse Brother   . Early death Brother   . Cystic kidney disease Other   . Kidney failure Cousin   . Sickle cell anemia Other     Social History   Socioeconomic History  . Marital status: Widowed    Spouse name: Not on file  . Number of children: Not on file  . Years of education: Not on file  . Highest education  level: Not on file  Occupational History  . Not on file  Social Needs  . Financial resource strain: Not hard at all  . Food insecurity    Worry: Never true    Inability: Never true  . Transportation needs    Medical: No    Non-medical: No  Tobacco Use  . Smoking status: Never Smoker  . Smokeless tobacco: Never Used  Substance and Sexual Activity  . Alcohol use: No    Alcohol/week: 0.0 standard drinks  . Drug use: No  . Sexual activity: Not Currently  Lifestyle  . Physical activity    Days per week: Not on file    Minutes per session: Not on file  . Stress: Not at all  Relationships  . Social Herbalist on phone: Not on file    Gets together: Not on file    Attends religious service: Not on file    Active member of club or organization: Not on file    Attends meetings of clubs or organizations: Not on file    Relationship status: Not on file  . Intimate partner violence    Fear of current or ex partner: Not on file    Emotionally abused: Not on file    Physically abused: Not on file    Forced sexual activity: Not on file  Other Topics Concern  . 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     Review of Systems:    Constitutional: No weight loss, fever or chills Skin: No rash  Cardiovascular: No chest pain Respiratory: No SOB Gastrointestinal: See HPI and otherwise negative Genitourinary: No dysuria  Neurological: No headache, dizziness or syncope Musculoskeletal: No new muscle or joint pain Hematologic: No bleeding  Psychiatric: No history of depression or anxiety   Physical Exam:  Vital signs: BP (!) 172/90 Comment: forgot meds this am  Pulse 69   Temp 98.7 F (37.1 C)   Ht  5\' 3"  (1.6 m)   Wt 157 lb (71.2 kg)   BMI 27.81 kg/m    Constitutional:   Pleasant AA female appears to be in NAD, Well developed, Well nourished, alert and cooperative Head:  Normocephalic and atraumatic. Eyes:   PEERL, EOMI. No icterus. Conjunctiva pink. Ears:  Normal auditory acuity. Neck:  Supple Throat: Oral cavity and pharynx without inflammation, swelling or lesion.  Respiratory: Respirations even and unlabored. Lungs clear to auscultation bilaterally.   No wheezes, crackles, or rhonchi.  Cardiovascular: Normal S1, S2. No MRG. Regular rate and rhythm. No peripheral edema, cyanosis or pallor.  Gastrointestinal:  Soft, nondistended, nontender. No rebound or guarding. Normal bowel sounds. No appreciable masses or hepatomegaly. Rectal:  Not performed.  Msk:  Symmetrical without gross deformities. Without edema, no deformity or joint abnormality.  Neurologic:  Alert and  oriented x4;  grossly normal neurologically.  Skin:   Dry and intact without significant lesions or rashes. Psychiatric: Demonstrates good judgement and reason without abnormal affect or behaviors.  MOST RECENT LABS AND IMAGING: CBC    Component Value Date/Time   WBC 4.1 03/18/2019 1729   RBC 3.66 (L) 03/18/2019 1729   HGB 11.3 (L) 03/18/2019 1729   HGB 11.8 03/15/2015 0926   HCT 34.9 (L) 03/18/2019 1729   HCT 36.8 03/15/2015 0926   PLT 156 03/18/2019 1729   PLT 228 03/15/2015 0926   MCV 95.4 03/18/2019 1729   MCV 90 03/15/2015 0926   MCV 89 03/26/2014 2120   MCH 30.9 03/18/2019 1729   MCHC 32.4 03/18/2019 1729   RDW 13.2 03/18/2019 1729   RDW 14.4 03/15/2015 0926   RDW 14.8 (H) 03/26/2014 2120   LYMPHSABS 1.0 02/16/2019 0659   LYMPHSABS 1.4 03/15/2015 0926   MONOABS 0.3 02/16/2019 0659   EOSABS 0.0 02/16/2019 0659   EOSABS 0.0 03/15/2015 0926   BASOSABS 0.0 02/16/2019 0659   BASOSABS 0.0 03/15/2015 0926    CMP     Component Value Date/Time   NA 137 03/18/2019 1729   NA 140 03/15/2015 0926   NA 144 03/26/2014 2120   K 3.7 03/18/2019 1729   K 3.8  03/26/2014 2120   CL 106 03/18/2019 1729   CL 112 (H) 03/26/2014 2120   CO2 19 (L) 03/18/2019 1729   CO2 23 03/26/2014 2120   GLUCOSE 81 03/18/2019 1729   GLUCOSE 124 (H) 03/26/2014 2120   BUN 53 (H) 03/18/2019 1729   BUN 40 (H) 03/15/2015 0926   BUN 50 (H) 03/26/2014 2120   CREATININE 4.98 (H) 03/18/2019 1729   CREATININE 3.30 (H) 03/26/2014 2120   CALCIUM 8.8 (L) 03/18/2019 1729   CALCIUM 8.4 (L) 03/26/2014 2120   PROT 7.5 02/16/2019 0659   PROT 7.2 03/15/2015 0926   PROT 7.9 03/26/2014 2120   ALBUMIN 4.1 02/16/2019 0659   ALBUMIN 4.2 03/15/2015 0926   ALBUMIN 3.5 03/26/2014 2120   AST 19 02/16/2019 0659   AST 25 03/26/2014 2120   ALT 12 02/16/2019 0659   ALT 22 03/26/2014 2120   ALKPHOS 44 02/16/2019 0659   ALKPHOS 72 03/26/2014 2120   BILITOT 0.9 02/16/2019 0659   BILITOT 0.3 03/15/2015 0926   BILITOT 0.2 03/26/2014 2120   GFRNONAA 8 (L) 03/18/2019 1729   GFRNONAA 15 (L) 03/26/2014 2120   GFRAA 9 (L) 03/18/2019 1729   GFRAA 18 (L) 03/26/2014 2120    Assessment: 1.  History of colon polyps: Patient reports last colonoscopy in [place] 13 to 14 years ago  with polyps, she is unsure what kind but notes she was told to follow-up  Plan: 1.  Scheduled patient for a surveillance colonoscopy in the [place] with Dr. Ardis Hughs as he is supervising this morning.  Did discuss risks, benefits, limitations and alternatives and patient agrees to proceed. 2.  Patient to follow in clinic per recommendations from Dr. Ardis Hughs after time of procedure.  Ellouise Newer, PA-C [facility] 05/06/2019, 9:20 AM  Cc: [name], [name] *

## 2019-05-13 ENCOUNTER — Encounter: Payer: Self-pay | Admitting: Internal Medicine

## 2019-05-17 DIAGNOSIS — H401131 Primary open-angle glaucoma, bilateral, mild stage: Secondary | ICD-10-CM | POA: Diagnosis not present

## 2019-05-17 DIAGNOSIS — H16223 Keratoconjunctivitis sicca, not specified as Sjogren's, bilateral: Secondary | ICD-10-CM | POA: Diagnosis not present

## 2019-05-19 ENCOUNTER — Other Ambulatory Visit: Payer: Self-pay | Admitting: Neurology

## 2019-05-19 DIAGNOSIS — F0280 Dementia in other diseases classified elsewhere without behavioral disturbance: Secondary | ICD-10-CM

## 2019-05-20 ENCOUNTER — Ambulatory Visit (HOSPITAL_COMMUNITY): Admission: RE | Admit: 2019-05-20 | Payer: Medicare Other | Source: Ambulatory Visit

## 2019-05-20 ENCOUNTER — Encounter (HOSPITAL_COMMUNITY): Payer: Self-pay

## 2019-06-09 ENCOUNTER — Telehealth: Payer: Self-pay | Admitting: Gastroenterology

## 2019-06-09 NOTE — Telephone Encounter (Signed)
Ok, thanks.

## 2019-06-10 ENCOUNTER — Encounter: Payer: Medicare Other | Admitting: Gastroenterology

## 2019-06-13 ENCOUNTER — Other Ambulatory Visit: Payer: Self-pay | Admitting: Internal Medicine

## 2019-06-18 DIAGNOSIS — R569 Unspecified convulsions: Secondary | ICD-10-CM | POA: Diagnosis not present

## 2019-06-18 DIAGNOSIS — F0280 Dementia in other diseases classified elsewhere without behavioral disturbance: Secondary | ICD-10-CM | POA: Diagnosis not present

## 2019-06-18 DIAGNOSIS — G3183 Dementia with Lewy bodies: Secondary | ICD-10-CM | POA: Diagnosis not present

## 2019-06-27 ENCOUNTER — Ambulatory Visit (HOSPITAL_COMMUNITY)
Admission: RE | Admit: 2019-06-27 | Discharge: 2019-06-27 | Disposition: A | Discharge status: Home / Self Care | Payer: Medicare Other | Source: Ambulatory Visit | Attending: Neurology | Admitting: Neurology

## 2019-06-27 ENCOUNTER — Other Ambulatory Visit: Payer: Self-pay

## 2019-06-27 DIAGNOSIS — F0280 Dementia in other diseases classified elsewhere without behavioral disturbance: Secondary | ICD-10-CM | POA: Diagnosis not present

## 2019-06-27 DIAGNOSIS — G3183 Dementia with Lewy bodies: Secondary | ICD-10-CM | POA: Insufficient documentation

## 2019-06-27 DIAGNOSIS — R413 Other amnesia: Secondary | ICD-10-CM | POA: Diagnosis not present

## 2019-06-27 DIAGNOSIS — R930 Abnormal findings on diagnostic imaging of skull and head, not elsewhere classified: Secondary | ICD-10-CM | POA: Diagnosis not present

## 2019-07-05 DIAGNOSIS — N185 Chronic kidney disease, stage 5: Secondary | ICD-10-CM | POA: Diagnosis not present

## 2019-07-29 ENCOUNTER — Encounter: Payer: Self-pay | Admitting: Internal Medicine

## 2019-07-29 ENCOUNTER — Ambulatory Visit (INDEPENDENT_AMBULATORY_CARE_PROVIDER_SITE_OTHER): Payer: Medicare Other | Admitting: Internal Medicine

## 2019-07-29 ENCOUNTER — Telehealth: Payer: Self-pay

## 2019-07-29 ENCOUNTER — Other Ambulatory Visit: Payer: Self-pay

## 2019-07-29 VITALS — BP 140/88 | HR 70 | Temp 96.6°F | Ht 63.0 in | Wt 155.0 lb

## 2019-07-29 DIAGNOSIS — R413 Other amnesia: Secondary | ICD-10-CM | POA: Diagnosis not present

## 2019-07-29 DIAGNOSIS — I10 Essential (primary) hypertension: Secondary | ICD-10-CM | POA: Diagnosis not present

## 2019-07-29 DIAGNOSIS — E785 Hyperlipidemia, unspecified: Secondary | ICD-10-CM

## 2019-07-29 DIAGNOSIS — R634 Abnormal weight loss: Secondary | ICD-10-CM | POA: Diagnosis not present

## 2019-07-29 DIAGNOSIS — E039 Hypothyroidism, unspecified: Secondary | ICD-10-CM

## 2019-07-29 DIAGNOSIS — R253 Fasciculation: Secondary | ICD-10-CM

## 2019-07-29 DIAGNOSIS — D649 Anemia, unspecified: Secondary | ICD-10-CM | POA: Diagnosis not present

## 2019-07-29 DIAGNOSIS — Z13220 Encounter for screening for lipoid disorders: Secondary | ICD-10-CM | POA: Diagnosis not present

## 2019-07-29 DIAGNOSIS — N184 Chronic kidney disease, stage 4 (severe): Secondary | ICD-10-CM | POA: Diagnosis not present

## 2019-07-29 LAB — LIPID PANEL
Cholesterol: 322 mg/dL — ABNORMAL HIGH (ref 0–200)
HDL: 68.6 mg/dL (ref >39.00)
LDL Cholesterol: 235 mg/dL — ABNORMAL HIGH (ref 0–99)
NonHDL: 253.26
Total CHOL/HDL Ratio: 5
Triglycerides: 93 mg/dL (ref 0.0–149.0)
VLDL: 18.6 mg/dL (ref 0.0–40.0)

## 2019-07-29 LAB — COMPREHENSIVE METABOLIC PANEL
ALT: 9 U/L (ref 0–35)
AST: 14 U/L (ref 0–37)
Albumin: 3.8 g/dL (ref 3.5–5.2)
Alkaline Phosphatase: 48 U/L (ref 39–117)
BUN: 45 mg/dL — ABNORMAL HIGH (ref 6–23)
CO2: 20 mEq/L (ref 19–32)
Calcium: 9.1 mg/dL (ref 8.4–10.5)
Chloride: 111 mEq/L (ref 96–112)
Creatinine, Ser: 4.76 mg/dL (ref 0.40–1.20)
GFR: 10.84 mL/min — CL (ref >60.00)
Glucose, Bld: 81 mg/dL (ref 70–99)
Potassium: 4.4 mEq/L (ref 3.5–5.1)
Sodium: 140 mEq/L (ref 135–145)
Total Bilirubin: 0.4 mg/dL (ref 0.2–1.2)
Total Protein: 6.7 g/dL (ref 6.0–8.3)

## 2019-07-29 LAB — CBC WITH DIFFERENTIAL/PLATELET
Basophils Absolute: 0 10*3/uL (ref 0.0–0.1)
Basophils Relative: 0.5 % (ref 0.0–3.0)
Eosinophils Absolute: 0 10*3/uL (ref 0.0–0.7)
Eosinophils Relative: 0.7 % (ref 0.0–5.0)
HCT: 32.6 % — ABNORMAL LOW (ref 36.0–46.0)
Hemoglobin: 10.5 g/dL — ABNORMAL LOW (ref 12.0–15.0)
Lymphocytes Relative: 37.4 % (ref 12.0–46.0)
Lymphs Abs: 1.1 10*3/uL (ref 0.7–4.0)
MCHC: 32.1 g/dL (ref 30.0–36.0)
MCV: 95.4 fl (ref 78.0–100.0)
Monocytes Absolute: 0.3 10*3/uL (ref 0.1–1.0)
Monocytes Relative: 9 % (ref 3.0–12.0)
Neutro Abs: 1.6 10*3/uL (ref 1.4–7.7)
Neutrophils Relative %: 52.4 % (ref 43.0–77.0)
Platelets: 172 10*3/uL (ref 150.0–400.0)
RBC: 3.42 Mil/uL — ABNORMAL LOW (ref 3.87–5.11)
RDW: 14.8 % (ref 11.5–15.5)
WBC: 3 10*3/uL — ABNORMAL LOW (ref 4.0–10.5)

## 2019-07-29 LAB — TSH: TSH: 0.6 u[IU]/mL (ref 0.35–4.50)

## 2019-07-29 NOTE — Telephone Encounter (Signed)
-----   Message from Delorise Jackson, MD sent at 07/29/2019  8:57 AM EST ----- Fax note Dr. Manuella Ghazi neurology

## 2019-07-29 NOTE — Patient Instructions (Addendum)
Start Exelon (Rivastigmine) 1.5 mg two times a day  Discussed potential side effects of medication including nausea, increased heart rate    Vitamin D3 2000 IU daily + centrum or nature made MVT  Premier protein  Pirq dairy free    COVID-19 Vaccine Information can be found at: ShippingScam.co.uk For questions related to vaccine distribution or appointments, please email vaccine@[email or web address] or call [phone].  COVID 19  [phone] in [place]   [phone] in [place]  [phone] in [place]    Call [facility] back when ready for colonoscopy in [place]    Neuropathic Pain Neuropathic pain is pain caused by damage to the nerves that are responsible for certain sensations in your body (sensory nerves). The pain can be caused by:  Damage to the sensory nerves that send signals to your spinal cord and brain (peripheral nervous system).  Damage to the sensory nerves in your brain or spinal cord (central nervous system). Neuropathic pain can make you more sensitive to pain. Even a minor sensation can feel very painful. This is usually a long-term condition that can be difficult to treat. The type of pain differs from person to person. It may:  Start suddenly (acute), or it may develop slowly and last for a long time (chronic).  Come and go as damaged nerves heal, or it may stay at the same level for years.  Cause emotional distress, loss of sleep, and a lower quality of life. What are the causes? The most common cause of this condition is diabetes. Many other diseases and conditions can also cause neuropathic pain. Causes of neuropathic pain can be classified as:  Toxic. This is caused by medicines and chemicals. The most common cause of toxic neuropathic pain is damage from cancer treatments (chemotherapy).  Metabolic. This can be caused by: ? Diabetes. This is the most common disease that damages the  nerves. ? Lack of vitamin B from long-term alcohol abuse.  Traumatic. Any injury that cuts, crushes, or stretches a nerve can cause damage and pain. A common example is feeling pain after losing an arm or leg (phantom limb pain).  Compression-related. If a sensory nerve gets trapped or compressed for a long period of time, the blood supply to the nerve can be cut off.  Vascular. Many blood vessel diseases can cause neuropathic pain by decreasing blood supply and oxygen to nerves.  Autoimmune. This type of pain results from diseases in which the body's defense system (immune system) mistakenly attacks sensory nerves. Examples of autoimmune diseases that can cause neuropathic pain include lupus and multiple sclerosis.  Infectious. Many types of viral infections can damage sensory nerves and cause pain. Shingles infection is a common cause of this type of pain.  Inherited. Neuropathic pain can be a symptom of many diseases that are passed down through families (genetic). What increases the risk? You are more likely to develop this condition if:  You have diabetes.  You smoke.  You drink too much alcohol.  You are taking certain medicines, including medicines that kill cancer cells (chemotherapy) or that treat immune system disorders. What are the signs or symptoms? The main symptom is pain. Neuropathic pain is often described as:  Burning.  Shock-like.  Stinging.  Hot or cold.  Itching. How is this diagnosed? No single test can diagnose neuropathic pain. It is diagnosed based on:  Physical exam and your symptoms. Your health care provider will ask you about your pain. You may be asked to  use a pain scale to describe how bad your pain is.  Tests. These may be done to see if you have a high sensitivity to pain and to help find the cause and location of any sensory nerve damage. They include: ? Nerve conduction studies to test how well nerve signals travel through your sensory  nerves (electrodiagnostic testing). ? Stimulating your sensory nerves through electrodes on your skin and measuring the response in your spinal cord and brain (somatosensory evoked potential).  Imaging studies, such as: ? X-rays. ? CT scan. ? MRI. How is this treated? Treatment for neuropathic pain may change over time. You may need to try different treatment options or a combination of treatments. Some options include:  Treating the underlying cause of the neuropathy, such as diabetes, kidney disease, or vitamin deficiencies.  Stopping medicines that can cause neuropathy, such as chemotherapy.  Medicine to relieve pain. Medicines may include: ? Prescription or over-the-counter pain medicine. ? Anti-seizure medicine. ? Antidepressant medicines. ? Pain-relieving patches that are applied to painful areas of skin. ? A medicine to numb the area (local anesthetic), which can be injected as a nerve block.  Transcutaneous nerve stimulation. This uses electrical currents to block painful nerve signals. The treatment is painless.  Alternative treatments, such as: ? Acupuncture. ? Meditation. ? Massage. ? Physical therapy. ? Pain management programs. ? Counseling. Follow these instructions at home: Medicines   Take over-the-counter and prescription medicines only as told by your health care provider.  Do not drive or use heavy machinery while taking prescription pain medicine.  If you are taking prescription pain medicine, take actions to prevent or treat constipation. Your health care provider may recommend that you: ? Drink enough fluid to keep your urine pale yellow. ? Eat foods that are high in fiber, such as fresh fruits and vegetables, whole grains, and beans. ? Limit foods that are high in fat and processed sugars, such as fried or sweet foods. ? Take an over-the-counter or prescription medicine for constipation. Lifestyle   Have a good support system at home.  Consider  joining a chronic pain support group.  Do not use any products that contain nicotine or tobacco, such as cigarettes and e-cigarettes. If you need help quitting, ask your health care provider.  Do not drink alcohol. General instructions  Learn as much as you can about your condition.  Work closely with all your health care providers to find the treatment plan that works best for you.  Ask your health care provider what activities are safe for you.  Keep all follow-up visits as told by your health care provider. This is important. Contact a health care provider if:  Your pain treatments are not working.  You are having side effects from your medicines.  You are struggling with tiredness (fatigue), mood changes, depression, or anxiety. Summary  Neuropathic pain is pain caused by damage to the nerves that are responsible for certain sensations in your body (sensory nerves).  Neuropathic pain may come and go as damaged nerves heal, or it may stay at the same level for years.  Neuropathic pain is usually a long-term condition that can be difficult to treat. Consider joining a chronic pain support group. This information is not intended to replace advice given to you by your health care provider. Make sure you discuss any questions you have with your health care provider. Document Revised: 09/09/2018 Document Reviewed: 06/05/2017 Elsevier Patient Education  [place].  Varicose Veins Varicose veins are  veins that have become enlarged, bulged, and twisted. They most often appear in the legs. What are the causes? This condition is caused by damage to the valves in the vein. These valves help blood return to your heart. When they are damaged and they stop working properly, blood may flow backward and back up in the veins near the skin, causing the veins to get larger and appear twisted. The condition can result from any issue that causes blood to back up, like pregnancy, prolonged  standing, or obesity. What increases the risk? This condition is more likely to develop in people who are:  On their feet a lot.  Pregnant.  Overweight. What are the signs or symptoms? Symptoms of this condition include:  Bulging, twisted, and bluish veins.  A feeling of heaviness. This may be worse at the end of the day.  Leg pain. This may be worse at the end of the day.  Swelling in the leg.  Changes in skin color over the veins. How is this diagnosed? This condition may be diagnosed based on your symptoms, a physical exam, and an ultrasound test. How is this treated? Treatment for this condition may involve:  Avoiding sitting or standing in one position for long periods of time.  Wearing compression stockings. These stockings help to prevent blood clots and reduce swelling in the legs.  Raising (elevating) the legs when resting.  Losing weight.  Exercising regularly. If you have persistent symptoms or want to improve the way your varicose veins look, you may choose to have a procedure to close the varicose veins off or to remove them. Treatments to close off the veins include:  Sclerotherapy. In this treatment, a solution is injected into a vein to close it off.  Laser treatment. In this treatment, the vein is heated with a laser to close it off.  Radiofrequency vein ablation. In this treatment, an electrical current produced by radio waves is used to close off the vein. Treatments to remove the veins include:  Phlebectomy. In this treatment, the veins are removed through small incisions made over the veins.  Vein ligation and stripping. In this treatment, incisions are made over the veins. The veins are then removed after being tied (ligated) with stitches (sutures). Follow these instructions at home: Activity  Walk as much as possible. Walking increases blood flow. This helps blood return to the heart and takes pressure off your veins. It also increases your  cardiovascular strength.  Follow your health care provider's instructions about exercising.  Do not stand or sit in one position for a long period of time.  Do not sit with your legs crossed.  Rest with your legs raised during the day. General instructions   Follow any diet instructions given to you by your health care provider.  Wear compression stockings as directed by your health care provider. Do not wear other kinds of tight clothing around your legs, pelvis, or waist.  Elevate your legs at night to above the level of your heart.  If you get a cut in the skin over the varicose vein and the vein bleeds: ? Lie down with your leg raised. ? Apply firm pressure to the cut with a clean cloth until the bleeding stops. ? Place a bandage (dressing) on the cut. Contact a health care provider if:  The skin around your varicose veins starts to break down.  You have pain, redness, tenderness, or hard swelling over a vein.  You are uncomfortable because of  pain.  You get a cut in the skin over a varicose vein and it will not stop bleeding. Summary  Varicose veins are veins that have become enlarged, bulged, and twisted. They most often appear in the legs.  This condition is caused by damage to the valves in the vein. These valves help blood return to your heart.  Treatment for this condition includes frequent movements, wearing compression stockings, losing weight, and exercising regularly. In some cases, procedures are done to close off or remove the veins.  Treatment for this condition may include wearing compression stockings, elevating the legs, losing weight, and engaging in regular activity. In some cases, procedures are done to close off or remove the veins. This information is not intended to replace advice given to you by your health care provider. Make sure you discuss any questions you have with your health care provider. Document Revised: 07/15/2018 Document Reviewed:  06/11/2016 Elsevier Patient Education  [place].

## 2019-07-29 NOTE — Telephone Encounter (Signed)
Faxed

## 2019-07-29 NOTE — Progress Notes (Signed)
Chief Complaint  Patient presents with  . Follow-up   F/u with daughter naiema  1. Memory loss did not start exelon 1.5 bid per neurology due to c/w side effects EEG negative and MRI age/HTN related changes  F/u 09/2019 neurology  She is also having lower ext muscle twitching and rec f/u with neurology re this  2. 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 Joelyn Oms missed last appt  3. Weight loss trended down 30 lbs since 2019 she was eating less and thinks was depressed  4. HTN did take meds today on coreg 6.25 mg bid, dilt 120 mg xr not taking and lasix 40 mg qd did not take, hydralazine 25 mg bid not taking  5. HLD not taking zetia 10 mg qd   Review of Systems  Constitutional: Positive for weight loss.  HENT: Negative for hearing loss.   Eyes: Negative for blurred vision.  Respiratory: Negative for shortness of breath.   Cardiovascular: Negative for chest pain.  Musculoskeletal:       Muscle twitching    Skin: Negative for rash.  Neurological: Negative for headaches.  Psychiatric/Behavioral: Positive for memory loss. Negative for depression.   Past Medical History:  Diagnosis Date  . Abnormal Pap smear of cervix    mid 40s to 50s   . Allergy   . Anemia   . Brittle nails   . Chicken pox   . Chronic kidney disease    CKD 4/borderline 5   . 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 Emphysema of lung (HCC)    chronic bronchitis   . Fibrocystic breast changes   . Glaucoma    [name] [name]  . Heart murmur   . Hyperlipidemia   . Hypertension   . Measles    childhood  . Secondary hyperparathyroidism ([place])   . Shoulder pain   . Sleep apnea   . Thyroid disease    hypothyroidism    Past Surgical History:  Procedure Laterality Date  . ABDOMINAL HYSTERECTOMY     total 2/2 fibroids in [place] CYST REMOVAL  12/2012   Performed at [facility]  . UTERINE FIBROID SURGERY     In [place] several years ago  . VARICOSE VEIN SURGERY     left with h/o abnormal abi   . VEIN  LIGATION AND STRIPPING Left    Performed in [place]   Family History  Problem Relation Age of Onset  . Hypertension Mother   . Diabetes Mother   . Arthritis Mother   . Hypertension Father   . Stroke Father        complications from HTN  . Prostate cancer Father   . Alcohol abuse Brother   . COPD Brother   . Depression Brother   . Drug abuse Brother   . Early death Brother   . Mental illness Daughter   . Diabetes Son   . Alcohol abuse Brother   . Drug abuse Brother   . Early death Brother   . Cystic kidney disease Other   . Kidney failure Cousin   . Sickle cell anemia Other   . Stomach cancer Neg Hx   . Pancreatic cancer Neg Hx   . Esophageal cancer Neg Hx    Social History   Socioeconomic History  . Marital status: Widowed    Spouse name: Not on file  . Number of children: Not on file  . Years of education: Not  on file  . Highest education level: Not on file  Occupational History  . Not on file  Tobacco Use  . Smoking status: Never Smoker  . Smokeless tobacco: Never Used  Substance and Sexual Activity  . Alcohol use: No    Alcohol/week: 0.0 standard drinks  . Drug use: No  . Sexual activity: Not Currently  Other Topics Concern  . 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 Difficulty of Paying Living Expenses: Not on file  Food Insecurity:   . Worried About Charity fundraiser in the Last Year: Not on file  . Ran Out of Food in the Last Year: Not on file  Transportation Needs:   . Lack of Transportation (Medical): Not on file  . Lack of Transportation (Non-Medical): Not on file  Physical Activity:   . Days of Exercise per Week: Not on file  . Minutes of Exercise per Session: Not  on file  Stress:   . Feeling of Stress : Not on file  Social Connections:   . Frequency of Communication with Friends and Family: Not on file  . Frequency of Social Gatherings with Friends and Family: Not on file  . Attends Religious Services: Not on file  . Active Member of Clubs or Organizations: Not on file  . Attends Archivist Meetings: Not on file  . Marital Status: Not on file  Intimate Partner Violence:   . Fear of Current or Ex-Partner: Not on file  . Emotionally Abused: Not on file  . Physically Abused: Not on file  . Sexually Abused: Not on file   Current Meds  Medication Sig  . carvedilol (COREG) 6.25 MG tablet Take 1 tablet (6.25 mg total) by mouth 2 (two) times daily with a meal.  . dorzolamide-timolol (COSOPT) 22.3-6.8 MG/ML ophthalmic solution Place 1 drop into both eyes every morning.   . ezetimibe (ZETIA) 10 MG tablet Take 1 tablet (10 mg total) by mouth daily.  . furosemide (LASIX) 40 MG tablet Take 1 tablet (40 mg total) by mouth daily as needed.  . hydrALAZINE (APRESOLINE) 25 MG tablet Take 1 tablet (25 mg total) by mouth 2 (two) times daily.  . Latanoprostene Bunod (VYZULTA) 0.024 % SOLN Apply 1 drop to eye at bedtime. Both eyes per [name] (Patient taking differently: Place 1 drop into both eyes at bedtime. Both eyes per [name])  . Multiple Vitamin (MULTIVITAMIN WITH MINERALS) TABS tablet Take 1 tablet by mouth daily with lunch.   Allergies  Allergen Reactions  . Citrus Itching and Swelling   No results found for this or any previous visit (from the past 2160 hour(s)). Objective  Body mass index is 27.46 kg/m. Wt Readings from Last 3 Encounters:  07/29/19 155 lb (70.3 kg)  05/06/19 157 lb (71.2 kg)  04/27/19 159 lb 12.8 oz (72.5 kg)   Temp Readings from Last 3 Encounters:  07/29/19 (!) 96.6 F (35.9 C) (Temporal)  05/06/19 98.7 F (37.1 C)  03/18/19 98.3 F (36.8 C) (Oral)   BP Readings from Last 3 Encounters:   07/29/19 140/88  05/06/19 (!) 172/90  04/27/19 (!) 163/63   Pulse Readings from Last 3 Encounters:  07/29/19 70  05/06/19 69  04/27/19 63    Physical Exam Vitals and nursing note reviewed.  Constitutional:      Appearance: Normal appearance. She is well-developed and well-groomed. She is obese.  HENT:     Head: Normocephalic and atraumatic.  Eyes:     Conjunctiva/sclera: Conjunctivae normal.     Pupils: Pupils are equal, round, and reactive to light.  Cardiovascular:     Rate and Rhythm: Normal rate and regular rhythm.     Heart sounds: Normal heart sounds. No murmur.  Pulmonary:     Effort: Pulmonary effort is normal.     Breath sounds: Normal breath sounds.  Abdominal:     General: Abdomen is flat. Bowel sounds are normal.     Tenderness: There is no abdominal tenderness.  Musculoskeletal:     Right lower leg: No edema.     Left lower leg: No edema.  Skin:    General: Skin is warm and dry.     Comments: Varicose veins to legs   Neurological:     General: No focal deficit present.     Mental Status: She is alert and oriented to person, place, and time. Mental status is at baseline.     Gait: Gait normal.  Psychiatric:        Attention and Perception: Attention and perception normal.        Mood and Affect: Mood and affect normal.        Speech: Speech normal.        Behavior: Behavior normal. Behavior is cooperative.        Thought Content: Thought content normal.        Cognition and Memory: Cognition normal. Memory is impaired.        Judgment: Judgment normal.     Assessment  Plan  Essential hypertension Encouraged compliance with meds   Anemia, unspecified type - Plan: CBC with Differential/Platelet, Iron, TIBC and Ferritin Panel  Hypothyroidism, unspecified type - Plan: TSH  Stage 4 chronic kidney disease ([place]) - Plan: Comprehensive metabolic panel F/u Dr. Joelyn Oms renal   Memory loss, lower ext muscle twitching  Did not take exelon 1.5 mg bid due  c/w side effects  F/u neurology 09/2019  Daughter wants home Health RN for meds pt declines call back if wanted   Hyperlipidemia, unspecified hyperlipidemia type Did not take zetia   Weight loss rec premier protein shake may also need renals rec on shakes with CKD 4/5    HM Declines flu shot MMR immune rec hep b vaccine not immunedisc'edprevpt wants to think about it Tdap had 10/19/13 Dr. Heath Gold Will discshingrixin future covid 19 vaccine wants  Prevnar had 09/14/17  pna 23 10/19/13  Declines further mammograms though last 11/27/15 [facility] with b/l asymmetries -referred today pt needs to call and schedule  Last DEXA in [place] years ago agreeable to repeat ordered today -sch dexa as well   Pap out of age window  Colonoscopy 8/4/14colonpolyps will disc with pt If wants repeat in 12/2017  -referred colonoscopyprev pt to schedule will callwants to see [facility] in [place] had colonoscopy scheduled appt sch 06/2019 [facility] [place] call back to schedule colonoscopy   Never smoker [facility] Venetia Maxon yet  Renal appt 05/19/18 Dr. Pearson [place] CKD 5 pt wants to do conservative care as long as possible  BP 160/87 on BB and lasix  Provider: Dr. [name] [name]-Internal Medicine

## 2019-07-30 LAB — IRON,TIBC AND FERRITIN PANEL
%SAT: 25 % (calc) (ref 16–45)
Ferritin: 129 ng/mL (ref 16–288)
Iron: 76 ug/dL (ref 45–160)
TIBC: 307 mcg/dL (calc) (ref 250–450)

## 2019-08-04 ENCOUNTER — Other Ambulatory Visit: Payer: Self-pay | Admitting: Family Medicine

## 2019-08-04 DIAGNOSIS — D72819 Decreased white blood cell count, unspecified: Secondary | ICD-10-CM

## 2019-08-05 ENCOUNTER — Ambulatory Visit: Payer: Medicare Other | Attending: Internal Medicine

## 2019-08-05 ENCOUNTER — Telehealth: Payer: Self-pay | Admitting: Internal Medicine

## 2019-08-05 ENCOUNTER — Telehealth: Payer: Self-pay

## 2019-08-05 DIAGNOSIS — Z23 Encounter for immunization: Secondary | ICD-10-CM | POA: Insufficient documentation

## 2019-08-05 NOTE — Telephone Encounter (Signed)
I called the patient and informed her that Dr. Caryl Bis put in the referral for a hematologist and the patient wants the referral for [place] not [place].  Alante Tolan,cma

## 2019-08-05 NOTE — Telephone Encounter (Signed)
Holly Mcgee with Hematology at [facility] called and states that pt would not schedule an appt because she does not know who Dr Caryl Bis is. It looks like Dr Chauncey Cruel put in orders for her on behalf of [place] maybe? Please call pt to clarify.

## 2019-08-05 NOTE — Progress Notes (Signed)
   Covid-19 Vaccination Clinic  Name:  [name]    MRN: [id number] DOB: 1946/05/18  08/05/2019  [name] was observed post Covid-19 immunization for 15 minutes without incident. She was provided with Vaccine Information Sheet and instruction to access the V-Safe system.   [name] was instructed to call 911 with any severe reactions post vaccine: Marland Kitchen Difficulty breathing  . Swelling of face and throat  . A fast heartbeat  . A bad rash all over body  . Dizziness and weakness

## 2019-08-12 NOTE — Telephone Encounter (Signed)
LVM for the patient to call me back.  Jebediah Macrae,cma  

## 2019-08-12 NOTE — Telephone Encounter (Signed)
I received a message from the hematology office stating that the patient would not schedule.  Can you please contact her to see if she has any questions regarding this referral.  I placed on behalf of Dr. Terese Door while she was out of the office.  The referral was placed due to anemia and low white blood cell count.

## 2019-08-19 NOTE — Telephone Encounter (Signed)
Please reach back out to her regarding this.

## 2019-08-19 NOTE — Telephone Encounter (Signed)
LVM for the patient to return a call to me.  Keandria Berrocal,cma

## 2019-08-23 NOTE — Telephone Encounter (Signed)
LVM for the patient to call and discuss hematology.  Holly Mcgee,cma

## 2019-08-24 ENCOUNTER — Telehealth: Payer: Self-pay | Admitting: Internal Medicine

## 2019-08-24 NOTE — Telephone Encounter (Signed)
Left message for patient to call back and schedule Medicare Annual Wellness Visit (AWV) either virtually or audio only.  Last AWV 11/10/17; please schedule at anytime with Denisa O'Brien-Blaney at [facility].

## 2019-08-29 ENCOUNTER — Ambulatory Visit: Payer: Medicare Other | Attending: Internal Medicine

## 2019-08-29 DIAGNOSIS — Z23 Encounter for immunization: Secondary | ICD-10-CM

## 2019-08-29 NOTE — Progress Notes (Signed)
   Covid-19 Vaccination Clinic  Name:  Holly Mcgee    MRN: [id number] DOB: Oct 25, 1945  08/29/2019  [name] was observed post Covid-19 immunization for 15 minutes without incident. She was provided with Vaccine Information Sheet and instruction to access the V-Safe system.   [name] was instructed to call 911 with any severe reactions post vaccine: Marland Kitchen Difficulty breathing  . Swelling of face and throat  . A fast heartbeat  . A bad rash all over body  . Dizziness and weakness   Immunizations Administered    Name Date Dose VIS Date Route   Pfizer COVID-19 Vaccine 08/29/2019 10:13 AM 0.3 mL 05/13/2019 Intramuscular   Manufacturer: [employer], [employer]   Lot: [id number]   [place]: [id number]

## 2019-09-06 NOTE — Telephone Encounter (Signed)
I could not reach the patient to discuss hematology and the patient would not return a call back.  Diandra Cimini,cma

## 2019-09-19 DIAGNOSIS — D573 Sickle-cell trait: Secondary | ICD-10-CM | POA: Diagnosis not present

## 2019-09-19 DIAGNOSIS — G3183 Dementia with Lewy bodies: Secondary | ICD-10-CM | POA: Diagnosis not present

## 2019-09-19 DIAGNOSIS — F0280 Dementia in other diseases classified elsewhere without behavioral disturbance: Secondary | ICD-10-CM | POA: Diagnosis not present

## 2019-09-19 DIAGNOSIS — E538 Deficiency of other specified B group vitamins: Secondary | ICD-10-CM | POA: Diagnosis not present

## 2019-09-26 ENCOUNTER — Other Ambulatory Visit: Payer: Self-pay

## 2019-09-26 ENCOUNTER — Inpatient Hospital Stay: Payer: Medicare Other

## 2019-09-26 ENCOUNTER — Encounter: Payer: Self-pay | Admitting: Oncology

## 2019-09-26 ENCOUNTER — Inpatient Hospital Stay: Payer: Medicare Other | Attending: Oncology | Admitting: Oncology

## 2019-09-26 VITALS — BP 151/80 | HR 65 | Temp 97.4°F | Resp 16 | Wt 155.5 lb

## 2019-09-26 DIAGNOSIS — I120 Hypertensive chronic kidney disease with stage 5 chronic kidney disease or end stage renal disease: Secondary | ICD-10-CM | POA: Insufficient documentation

## 2019-09-26 DIAGNOSIS — D709 Neutropenia, unspecified: Secondary | ICD-10-CM | POA: Insufficient documentation

## 2019-09-26 DIAGNOSIS — E785 Hyperlipidemia, unspecified: Secondary | ICD-10-CM | POA: Diagnosis not present

## 2019-09-26 DIAGNOSIS — Z8042 Family history of malignant neoplasm of prostate: Secondary | ICD-10-CM

## 2019-09-26 DIAGNOSIS — N185 Chronic kidney disease, stage 5: Secondary | ICD-10-CM

## 2019-09-26 DIAGNOSIS — D649 Anemia, unspecified: Secondary | ICD-10-CM | POA: Insufficient documentation

## 2019-09-26 LAB — CBC WITH DIFFERENTIAL/PLATELET
Abs Immature Granulocytes: 0.01 10*3/uL (ref 0.00–0.07)
Basophils Absolute: 0 10*3/uL (ref 0.0–0.1)
Basophils Relative: 0 %
Eosinophils Absolute: 0 10*3/uL (ref 0.0–0.5)
Eosinophils Relative: 1 %
HCT: 31.1 % — ABNORMAL LOW (ref 36.0–46.0)
Hemoglobin: 10 g/dL — ABNORMAL LOW (ref 12.0–15.0)
Immature Granulocytes: 0 %
Lymphocytes Relative: 46 %
Lymphs Abs: 1.2 10*3/uL (ref 0.7–4.0)
MCH: 30 pg (ref 26.0–34.0)
MCHC: 32.2 g/dL (ref 30.0–36.0)
MCV: 93.4 fL (ref 80.0–100.0)
Monocytes Absolute: 0.3 10*3/uL (ref 0.1–1.0)
Monocytes Relative: 9 %
Neutro Abs: 1.2 10*3/uL — ABNORMAL LOW (ref 1.7–7.7)
Neutrophils Relative %: 44 %
Platelets: 153 10*3/uL (ref 150–400)
RBC: 3.33 MIL/uL — ABNORMAL LOW (ref 3.87–5.11)
RDW: 14.9 % (ref 11.5–15.5)
WBC: 2.7 10*3/uL — ABNORMAL LOW (ref 4.0–10.5)
nRBC: 0 % (ref 0.0–0.2)

## 2019-09-26 LAB — COMPREHENSIVE METABOLIC PANEL
ALT: 16 U/L (ref 0–44)
AST: 19 U/L (ref 15–41)
Albumin: 4 g/dL (ref 3.5–5.0)
Alkaline Phosphatase: 57 U/L (ref 38–126)
Anion gap: 10 (ref 5–15)
BUN: 64 mg/dL — ABNORMAL HIGH (ref 8–23)
CO2: 20 mmol/L — ABNORMAL LOW (ref 22–32)
Calcium: 8.7 mg/dL — ABNORMAL LOW (ref 8.9–10.3)
Chloride: 106 mmol/L (ref 98–111)
Creatinine, Ser: 5.29 mg/dL — ABNORMAL HIGH (ref 0.44–1.00)
GFR calc Af Amer: 9 mL/min — ABNORMAL LOW (ref >60)
GFR calc non Af Amer: 7 mL/min — ABNORMAL LOW (ref >60)
Glucose, Bld: 100 mg/dL — ABNORMAL HIGH (ref 70–99)
Potassium: 4.7 mmol/L (ref 3.5–5.1)
Sodium: 136 mmol/L (ref 135–145)
Total Bilirubin: 0.7 mg/dL (ref 0.3–1.2)
Total Protein: 7.2 g/dL (ref 6.5–8.1)

## 2019-09-26 LAB — IRON AND TIBC
Iron: 101 ug/dL (ref 28–170)
Saturation Ratios: 31 % (ref 10.4–31.8)
TIBC: 330 ug/dL (ref 250–450)
UIBC: 229 ug/dL

## 2019-09-26 LAB — HIV ANTIBODY (ROUTINE TESTING W REFLEX): HIV Screen 4th Generation wRfx: NONREACTIVE

## 2019-09-26 LAB — VITAMIN B12: Vitamin B-12: 857 pg/mL (ref 180–914)

## 2019-09-26 LAB — TECHNOLOGIST SMEAR REVIEW: Plt Morphology: NORMAL

## 2019-09-26 LAB — RETICULOCYTES
Immature Retic Fract: 5.7 % (ref 2.3–15.9)
RBC.: 3.36 MIL/uL — ABNORMAL LOW (ref 3.87–5.11)
Retic Count, Absolute: 37.3 10*3/uL (ref 19.0–186.0)
Retic Ct Pct: 1.1 % (ref 0.4–3.1)

## 2019-09-26 LAB — FERRITIN: Ferritin: 134 ng/mL (ref 11–307)

## 2019-09-26 LAB — FOLATE: Folate: 20.9 ng/mL (ref >5.9)

## 2019-09-27 LAB — HAPTOGLOBIN: Haptoglobin: 184 mg/dL (ref 42–346)

## 2019-09-29 LAB — MULTIPLE MYELOMA PANEL, SERUM
Albumin SerPl Elph-Mcnc: 3.5 g/dL (ref 2.9–4.4)
Albumin/Glob SerPl: 1.2 (ref 0.7–1.7)
Alpha 1: 0.2 g/dL (ref 0.0–0.4)
Alpha2 Glob SerPl Elph-Mcnc: 0.8 g/dL (ref 0.4–1.0)
B-Globulin SerPl Elph-Mcnc: 1 g/dL (ref 0.7–1.3)
Gamma Glob SerPl Elph-Mcnc: 1.1 g/dL (ref 0.4–1.8)
Globulin, Total: 3.1 g/dL (ref 2.2–3.9)
IgA: 171 mg/dL (ref 64–422)
IgG (Immunoglobin G), Serum: 1157 mg/dL (ref 586–1602)
IgM (Immunoglobulin M), Srm: 39 mg/dL (ref 26–217)
Total Protein ELP: 6.6 g/dL (ref 6.0–8.5)

## 2019-09-29 NOTE — Progress Notes (Signed)
Hematology/Oncology Consult note [facility] [phone] [phone]  Patient Care Team: [name], Nino Glow, MD as PCP - General (Internal Medicine)   Name of the patient: Holly Mcgee  [id number]  Oct 22, 1945    Reason for referral- leukopenia   Referring physician- Dr. Terese Door  Date of visit: 09/29/19   History of presenting illness- Patient is a 74 year old female with a past medical history significant for hyperlipidemia and diabetes chronic kidney disease among other medical problems who has been referred to me for anemia.  She also has a history of memory loss and is here with her daughter today. Looking back at her CBCs patient's hemoglobin has been mostly stable between 10-11 at least dating back to 2016.  White cell count fluctuates between 2.7-3.7 and platelet counts have been normal.  She has had waxing and waning neutrophil counts between 1.3-2.0.  She has not had any recurrent infections or hospitalizations.  Patient denies any complaints other than fatigue at this time.  ECOG PS- 1  Pain scale- 0   Review of systems- Review of Systems  Constitutional: Positive for malaise/fatigue. Negative for chills, fever and weight loss.  HENT: Negative for congestion, ear discharge and nosebleeds.   Eyes: Negative for blurred vision.  Respiratory: Negative for cough, hemoptysis, sputum production, shortness of breath and wheezing.   Cardiovascular: Negative for chest pain, palpitations, orthopnea and claudication.  Gastrointestinal: Negative for abdominal pain, blood in stool, constipation, diarrhea, heartburn, melena, nausea and vomiting.  Genitourinary: Negative for dysuria, flank pain, frequency, hematuria and urgency.  Musculoskeletal: Negative for back pain, joint pain and myalgias.  Skin: Negative for rash.  Neurological: Negative for dizziness, tingling, focal weakness, seizures, weakness and headaches.    Endo/Heme/Allergies: Does not bruise/bleed easily.  Psychiatric/Behavioral: Negative for depression and suicidal ideas. The patient does not have insomnia.     Allergies  Allergen Reactions  . Citrus Itching and Swelling    Patient Active Problem List   Diagnosis Date Noted  . Memory loss 07/29/2019  . Noncompliance with medications 10/15/2018  . CKD (chronic kidney disease) stage 5, GFR less than 15 ml/min (HCC) 02/16/2018  . Grief 05/25/2017  . Bradycardia 05/25/2017  . Chronic venous insufficiency 05/12/2016  . Lymphedema 05/12/2016  . Venous stasis dermatitis of right lower extremity 05/12/2016  . Essential hypertension 03/14/2015  . Allergic rhinitis with postnasal drip 03/14/2015  . Dysuria 03/14/2015  . Glaucoma 03/14/2015  . Polycystic kidney disease 03/14/2015  . 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 Hypothyroidism 03/14/2015  . Hyperlipidemia 03/14/2015  . Prediabetes 03/14/2015  . Influenza vaccination declined by patient 03/14/2015     Past Medical History:  Diagnosis Date  . Abnormal Pap smear of cervix    mid 40s to 50s   . Allergy   . Anemia   . Brittle nails   . Chicken pox   . Chronic kidney disease    CKD 4/borderline 5   . 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 Emphysema of lung (HCC)    chronic bronchitis   . Fibrocystic breast changes   . Glaucoma    [name] [name]  . Heart murmur   . Hyperlipidemia   . Hypertension   . Measles    childhood  . Secondary hyperparathyroidism ([place])   . Shoulder pain   . Sleep apnea   . Thyroid disease    hypothyroidism  Past Surgical History:  Procedure Laterality Date  . ABDOMINAL HYSTERECTOMY     total 2/2 fibroids in [place] CYST REMOVAL  12/2012   Performed at [facility]  . UTERINE FIBROID SURGERY     In [place] several years ago  . VARICOSE VEIN SURGERY     left with h/o abnormal abi   . VEIN LIGATION AND STRIPPING Left     Performed in [place]    Social History   Socioeconomic History  . Marital status: Widowed    Spouse name: Not on file  . Number of children: Not on file  . Years of education: Not on file  . Highest education level: Not on file  Occupational History  . Not on file  Tobacco Use  . Smoking status: Never Smoker  . Smokeless tobacco: Never Used  Substance and Sexual Activity  . Alcohol use: No    Alcohol/week: 0.0 standard drinks  . Drug use: No  . Sexual activity: Not Currently  Other Topics Concern  . 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 Difficulty of Paying Living Expenses:   Food Insecurity:   . Worried About Charity fundraiser in the Last Year:   . Arboriculturist in the Last Year:   Transportation Needs:   . Film/video editor (Medical):   Marland Kitchen Lack of Transportation (Non-Medical):   Physical Activity:   . Days of Exercise per Week:   . Minutes of Exercise per Session:   Stress:   . Feeling of Stress :   Social Connections:   . Frequency of Communication with Friends and Family:   . Frequency of Social Gatherings with Friends and Family:   . Attends Religious Services:   . Active Member of Clubs or Organizations:   . Attends Archivist Meetings:   Marland Kitchen Marital Status:   Intimate Partner Violence:   . Fear of Current or Ex-Partner:   . Emotionally Abused:   Marland Kitchen Physically Abused:   . Sexually Abused:      Family History  Problem Relation Age of Onset  . Hypertension Mother   . Diabetes Mother   . Arthritis Mother   . Hypertension Father   . Stroke Father        complications from HTN  . Prostate cancer Father   . Alcohol abuse Brother   . COPD Brother   .  Depression Brother   . Drug abuse Brother   . Early death Brother   . Mental illness Daughter   . Diabetes Son   . Alcohol abuse Brother   . Drug abuse Brother   . Early death Brother   . Cystic kidney disease Other   . Kidney failure Cousin   . Sickle cell anemia Other   . Stomach cancer Neg Hx   . Pancreatic cancer Neg Hx   . Esophageal cancer Neg Hx      Current Outpatient Medications:  .  carvedilol (COREG) 6.25 MG tablet, Take 1 tablet (6.25 mg total) by mouth 2 (two) times daily with a meal.,  Disp: 180 tablet, Rfl: 3 .  cholecalciferol (VITAMIN D3) 25 MCG (1000 UNIT) tablet, Take 1,000 Units by mouth daily., Disp: , Rfl:  .  dorzolamide-timolol (COSOPT) 22.3-6.8 MG/ML ophthalmic solution, Place 1 drop into both eyes every morning. , Disp: , Rfl:  .  ezetimibe (ZETIA) 10 MG tablet, Take 1 tablet (10 mg total) by mouth daily., Disp: 90 tablet, Rfl: 3 .  Latanoprostene Bunod (VYZULTA) 0.024 % SOLN, Apply 1 drop to eye at bedtime. Both eyes per [name] (Patient taking differently: Place 1 drop into both eyes at bedtime. Both eyes per [name]), Disp: 1 Bottle, Rfl: 0 .  Multiple Vitamin (MULTIVITAMIN WITH MINERALS) TABS tablet, Take 1 tablet by mouth daily with lunch., Disp: , Rfl:  .  furosemide (LASIX) 40 MG tablet, Take 1 tablet (40 mg total) by mouth daily as needed. (Patient not taking: Reported on 09/26/2019), Disp: 90 tablet, Rfl: 3 .  hydrALAZINE (APRESOLINE) 25 MG tablet, Take 1 tablet (25 mg total) by mouth 2 (two) times daily. (Patient not taking: Reported on 09/26/2019), Disp: 180 tablet, Rfl: 3 .  rivastigmine (EXELON) 1.5 MG capsule, Take 1.5 mg by mouth 2 (two) times daily., Disp: , Rfl:    Physical exam:  Vitals:   09/26/19 1333  BP: (!) 151/80  Pulse: 65  Resp: 16  Temp: (!) 97.4 F (36.3 C)  SpO2: 100%  Weight: 155 lb 8 oz (70.5 kg)   Physical Exam Constitutional:      General: She is not in acute distress. Cardiovascular:     Rate and  Rhythm: Normal rate and regular rhythm.     Heart sounds: Normal heart sounds.  Pulmonary:     Effort: Pulmonary effort is normal.     Breath sounds: Normal breath sounds.  Abdominal:     General: Bowel sounds are normal.     Palpations: Abdomen is soft.  Skin:    General: Skin is warm and dry.  Neurological:     Mental Status: She is alert and oriented to person, place, and time.        CMP Latest Ref Rng & Units 09/26/2019  Glucose 70 - 99 mg/dL 100(H)  BUN 8 - 23 mg/dL 64(H)  Creatinine 0.44 - 1.00 mg/dL 5.29(H)  Sodium 135 - 145 mmol/L 136  Potassium 3.5 - 5.1 mmol/L 4.7  Chloride 98 - 111 mmol/L 106  CO2 22 - 32 mmol/L 20(L)  Calcium 8.9 - 10.3 mg/dL 8.7(L)  Total Protein 6.5 - 8.1 g/dL 7.2  Total Bilirubin 0.3 - 1.2 mg/dL 0.7  Alkaline Phos 38 - 126 U/L 57  AST 15 - 41 U/L 19  ALT 0 - 44 U/L 16   CBC Latest Ref Rng & Units 09/26/2019  WBC 4.0 - 10.5 K/uL 2.7(L)  Hemoglobin 12.0 - 15.0 g/dL 10.0(L)  Hematocrit 36.0 - 46.0 % 31.1(L)  Platelets 150 - 400 K/uL 153     assessment and plan- Patient is a 74 y.o. female referred for leukopenia  1.  Leukopenia: [name] white cell count has been waxing and waning between 2.7-3 point over the last 5 years.  She has had a mild neutropenia with an ANC waxing and waning between 1.3-2 in the past.  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  There is no definite downward trend in her neutrophil count.  She therefore does not warrant a bone marrow biopsy  at this time.  Patient is already undergone hepatitis testing which was negative.Today I will obtain smear review and HIV testing  2.  Patient also has mild normocytic anemia but her hemoglobin has remained stable around 11.  I will do a complete anemia work-up including iron studies B12 folate reticulocyte count myeloma panel haptoglobin CMP.  Video visit with me in 2 weeks to  discuss the results of blood work   Visit Diagnosis 1. Normocytic anemia   2. Neutropenia, unspecified type [facility])     Dr. Randa Evens, MD, MPH [facility] at [facility] [phone] 09/29/2019  2:25 PM\

## 2019-10-05 DIAGNOSIS — N2581 Secondary hyperparathyroidism of renal origin: Secondary | ICD-10-CM | POA: Diagnosis not present

## 2019-10-05 DIAGNOSIS — I120 Hypertensive chronic kidney disease with stage 5 chronic kidney disease or end stage renal disease: Secondary | ICD-10-CM | POA: Diagnosis not present

## 2019-10-05 DIAGNOSIS — N185 Chronic kidney disease, stage 5: Secondary | ICD-10-CM | POA: Diagnosis not present

## 2019-10-05 DIAGNOSIS — Q613 Polycystic kidney, unspecified: Secondary | ICD-10-CM | POA: Diagnosis not present

## 2019-10-14 ENCOUNTER — Inpatient Hospital Stay: Payer: Medicare Other | Admitting: Oncology

## 2019-10-14 ENCOUNTER — Encounter: Payer: Self-pay | Admitting: Oncology

## 2019-10-14 NOTE — Progress Notes (Unsigned)
Patient called/ pre- screened for virtual appoinment today with oncologist. Patient was unaware of appointment and unsure of some of the medications she was taking.  Called daughter [name] who requested for number to be changed to son [name] for [employer] video visit. Daughter stated patient has dementia, therefore some answers may be inaccurate.

## 2019-10-17 ENCOUNTER — Inpatient Hospital Stay: Payer: Medicare Other | Attending: Oncology | Admitting: Oncology

## 2019-10-17 DIAGNOSIS — D649 Anemia, unspecified: Secondary | ICD-10-CM | POA: Diagnosis not present

## 2019-10-17 DIAGNOSIS — D709 Neutropenia, unspecified: Secondary | ICD-10-CM | POA: Diagnosis not present

## 2019-10-17 NOTE — Progress Notes (Signed)
Patient called assessed / pre- screened for virtual appoinment today with oncologist. No concerns or other complaints. Son assisted with call.

## 2019-10-19 NOTE — Progress Notes (Signed)
I connected with Holly Mcgee on 10/19/19 at  3:00 PM EDT by video enabled telemedicine visit and verified that I am speaking with the correct person using two identifiers.   I discussed the limitations, risks, security and privacy concerns of performing an evaluation and management service by telemedicine and the availability of in-person appointments. I also discussed with the patient that there may be a patient responsible charge related to this service. The patient expressed understanding and agreed to proceed.  Other persons participating in the visit and their role in the encounter:  Patients son  [name] location:  home Provider's location:  work  Risk analyst Complaint:  Discuss results of bloodwork  History of present illness: Patient is a 74 year old female with a past medical history significant for hyperlipidemia and diabetes chronic kidney disease among other medical problems who has been referred to me for anemia.  She also has a history of memory loss and is here with her daughter today. Looking back at her CBCs patient's hemoglobin has been mostly stable between 10-11 at least dating back to 2016.  White cell count fluctuates between 2.7-3.7 and platelet counts have been normal.  She has had waxing and waning neutrophil counts between 1.3-2.0.  She has not had any recurrent infections or hospitalizations.  Patient denies any complaints other than fatigue at this time.  Results of blood work from 09/26/2019 showed white count of 2.7, H&H of 10/31.1 with an ANC of 1.2 and a platelet count of 153.  CMP was significant for elevated creatinine of 5.2.  Folic acid level was normal.  B12 was normal at 857.  Iron studies were normal with a ferritin of 134.  Smear review was unremarkable.  Interval history: No acute issues since last visit.  Has baseline fatigue   Review of Systems  Constitutional: Positive for malaise/fatigue. Negative for chills, fever and weight loss.  HENT: Negative for  congestion, ear discharge and nosebleeds.   Eyes: Negative for blurred vision.  Respiratory: Negative for cough, hemoptysis, sputum production, shortness of breath and wheezing.   Cardiovascular: Negative for chest pain, palpitations, orthopnea and claudication.  Gastrointestinal: Negative for abdominal pain, blood in stool, constipation, diarrhea, heartburn, melena, nausea and vomiting.  Genitourinary: Negative for dysuria, flank pain, frequency, hematuria and urgency.  Musculoskeletal: Negative for back pain, joint pain and myalgias.  Skin: Negative for rash.  Neurological: Negative for dizziness, tingling, focal weakness, seizures, weakness and headaches.  Endo/Heme/Allergies: Does not bruise/bleed easily.  Psychiatric/Behavioral: Negative for depression and suicidal ideas. The patient does not have insomnia.     Allergies  Allergen Reactions  . Citrus Itching and Swelling    Past Medical History:  Diagnosis Date  . Abnormal Pap smear of cervix    mid 40s to 50s   . Allergy   . Anemia   . Brittle nails   . Chicken pox   . Chronic kidney disease    CKD 4/borderline 5   . 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 Emphysema of lung (HCC)    chronic bronchitis   . Fibrocystic breast changes   . Glaucoma    [name] [name]  . Heart murmur   . Hyperlipidemia   . Hypertension   . Measles    childhood  . Secondary hyperparathyroidism ([place])   . Shoulder pain   . Sleep apnea   . Thyroid disease    hypothyroidism     Past Surgical History:  Procedure Laterality  Date  . ABDOMINAL HYSTERECTOMY     total 2/2 fibroids in [place] CYST REMOVAL  12/2012   Performed at [facility]  . UTERINE FIBROID SURGERY     In [place] several years ago  . VARICOSE VEIN SURGERY     left with h/o abnormal abi   . VEIN LIGATION AND STRIPPING Left    Performed in [place]    Social History   Socioeconomic History  . Marital status: Widowed    Spouse name: Not on file  .  Number of children: Not on file  . Years of education: Not on file  . Highest education level: Not on file  Occupational History  . Not on file  Tobacco Use  . Smoking status: Never Smoker  . Smokeless tobacco: Never Used  Substance and Sexual Activity  . Alcohol use: No    Alcohol/week: 0.0 standard drinks  . Drug use: No  . Sexual activity: Not Currently  Other Topics Concern  . 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 Difficulty of Paying Living Expenses:   Food Insecurity:   . Worried About Charity fundraiser in the Last Year:   . Arboriculturist in the Last Year:   Transportation Needs:   . Film/video editor (Medical):   Marland Kitchen Lack of Transportation (Non-Medical):   Physical Activity:   . Days of Exercise per Week:   . Minutes of Exercise per Session:   Stress:   . Feeling of Stress :   Social Connections:   . Frequency of Communication with Friends and Family:   . Frequency of Social Gatherings with Friends and Family:   . Attends Religious Services:   . Active Member of Clubs or Organizations:   . Attends Archivist Meetings:   Marland Kitchen Marital Status:   Intimate Partner Violence:   . Fear of Current or Ex-Partner:   . Emotionally Abused:   Marland Kitchen Physically Abused:   . Sexually Abused:     Family History  Problem Relation Age of Onset  . Hypertension Mother   . Diabetes Mother   . Arthritis Mother   . Hypertension Father   . Stroke Father        complications from HTN  . Prostate cancer Father   . Alcohol abuse Brother   . COPD Brother   . Depression Brother   . Drug abuse Brother   . Early death Brother   . Mental illness Daughter   . Diabetes Son   . Alcohol abuse  Brother   . Drug abuse Brother   . Early death Brother   . Cystic kidney disease Other   . Kidney failure Cousin   . Sickle cell anemia Other   . Stomach cancer Neg Hx   . Pancreatic cancer Neg Hx   . Esophageal cancer Neg Hx      Current Outpatient Medications:  .  carvedilol (COREG) 6.25 MG tablet, Take 1 tablet (6.25 mg total) by mouth 2 (two) times daily with a meal., Disp: 180 tablet, Rfl: 3 .  cholecalciferol (VITAMIN D3) 25 MCG (1000 UNIT) tablet, Take 1,000 Units by mouth daily., Disp: , Rfl:  .  dorzolamide-timolol (COSOPT) 22.3-6.8 MG/ML ophthalmic solution, Place 1 drop into both eyes every morning. , Disp: , Rfl:  .  Latanoprostene Bunod (VYZULTA) 0.024 % SOLN, Apply 1 drop to eye at bedtime. Both eyes per [name] (Patient taking differently: Place 1 drop into both eyes at bedtime. Both eyes per [name]), Disp: 1 Bottle, Rfl: 0 .  Multiple Vitamin (MULTIVITAMIN WITH MINERALS) TABS tablet, Take 1 tablet by mouth daily with lunch., Disp: , Rfl:  .  ezetimibe (ZETIA) 10 MG tablet, Take 1 tablet (10 mg total) by mouth daily. (Patient not taking: Reported on 10/14/2019), Disp: 90 tablet, Rfl: 3 .  furosemide (LASIX) 40 MG tablet, Take 1 tablet (40 mg total) by mouth daily as needed. (Patient not taking: Reported on 09/26/2019), Disp: 90 tablet, Rfl: 3 .  hydrALAZINE (APRESOLINE) 25 MG tablet, Take 1 tablet (25 mg total) by mouth 2 (two) times daily. (Patient not taking: Reported on 09/26/2019), Disp: 180 tablet, Rfl: 3 .  rivastigmine (EXELON) 1.5 MG capsule, Take 1.5 mg by mouth 2 (two) times daily., Disp: , Rfl:   No results found.  No images are attached to the encounter.   CMP Latest Ref Rng & Units 09/26/2019  Glucose 70 - 99 mg/dL 100(H)  BUN 8 - 23 mg/dL 64(H)  Creatinine 0.44 - 1.00 mg/dL 5.29(H)  Sodium 135 - 145 mmol/L 136  Potassium 3.5 - 5.1 mmol/L 4.7  Chloride 98 - 111 mmol/L 106  CO2 22 - 32 mmol/L 20(L)  Calcium 8.9 - 10.3 mg/dL 8.7(L)  Total  Protein 6.5 - 8.1 g/dL 7.2  Total Bilirubin 0.3 - 1.2 mg/dL 0.7  Alkaline Phos 38 - 126 U/L 57  AST 15 - 41 U/L 19  ALT 0 - 44 U/L 16   CBC Latest Ref Rng & Units 09/26/2019  WBC 4.0 - 10.5 K/uL 2.7(L)  Hemoglobin 12.0 - 15.0 g/dL 10.0(L)  Hematocrit 36.0 - 46.0 % 31.1(L)  Platelets 150 - 400 K/uL 153     Observation/objective: Appears in no acute distress over video visit today.  Breathing is nonlabored  Assessment and plan: Patient is a 74 year old female with a past medical history significant for CKD among other medical problems.  She was referred for leukopenia and is here to discuss results of blood work  1. Her recent CBC showed white count of 2.7 with an ANC of 1.2.  She has had waxing and waning low white counts in the past with neutropenia.  She had a white count of 2.7 in June 2020 which then went up to 3.1 and then 4.1.  I suspect her neutropenia is likely benign and chronic and does not require a bone marrow biopsy at this time.  However if there is a continued downtrend in her neutrophil counts I will consider getting a bone marrow at that time.  2. Normocytic anemia: Her hemoglobin has been between 10-11 over the last 1-1/2-year.  She had a complete anemia work-up which showed normal iron studies, B12, folate, myeloma panel and haptoglobin.  I therefore suspect that her anemia secondary to chronic kidney disease.  Patient is not currently on dialysis but that is being considered for her.  There may be a role for EPO down the line if her hemoglobin starts trending down and is closer to 9.  This can be done by nephrology if she is on  dialysis.  However if she is not on dialysis we will consider doing this based on her counts down the line   Follow-up instructions: CBC with differential in 2 months in 4 months.  See Dr. Janese Banks in 4 months  I discussed the assessment and treatment plan with the patient. The patient was provided an opportunity to ask questions and all were answered.  The patient agreed with the plan and demonstrated an understanding of the instructions.   The patient was advised to call back or seek an in-person evaluation if the symptoms worsen or if the condition fails to improve as anticipated.   Visit Diagnosis: 1. Normocytic anemia   2. Neutropenia, unspecified type ([place])     Dr. Randa Evens, MD, MPH [facility] at [facility] Tel- [phone] 10/19/2019 12:53 PM

## 2019-10-26 ENCOUNTER — Other Ambulatory Visit: Payer: Self-pay

## 2019-10-28 ENCOUNTER — Ambulatory Visit: Payer: Medicare Other | Admitting: Internal Medicine

## 2019-10-28 ENCOUNTER — Telehealth: Payer: Self-pay | Admitting: Internal Medicine

## 2019-10-28 NOTE — Telephone Encounter (Signed)
Patient no-showed today's appointment; appointment was for 10/28/19 at 9:00 am, provider notified for review of record. My chart message sent to re-schedule

## 2019-11-17 DIAGNOSIS — H401113 Primary open-angle glaucoma, right eye, severe stage: Secondary | ICD-10-CM | POA: Diagnosis not present

## 2019-11-17 DIAGNOSIS — H5203 Hypermetropia, bilateral: Secondary | ICD-10-CM | POA: Diagnosis not present

## 2019-11-17 DIAGNOSIS — H524 Presbyopia: Secondary | ICD-10-CM | POA: Diagnosis not present

## 2019-11-17 DIAGNOSIS — H2513 Age-related nuclear cataract, bilateral: Secondary | ICD-10-CM | POA: Diagnosis not present

## 2019-11-17 DIAGNOSIS — H34832 Tributary (branch) retinal vein occlusion, left eye, with macular edema: Secondary | ICD-10-CM | POA: Diagnosis not present

## 2019-11-17 DIAGNOSIS — H401121 Primary open-angle glaucoma, left eye, mild stage: Secondary | ICD-10-CM | POA: Diagnosis not present

## 2019-12-16 ENCOUNTER — Inpatient Hospital Stay: Payer: Medicare Other | Attending: Oncology

## 2019-12-16 ENCOUNTER — Other Ambulatory Visit: Payer: Self-pay

## 2019-12-16 DIAGNOSIS — D72819 Decreased white blood cell count, unspecified: Secondary | ICD-10-CM | POA: Diagnosis not present

## 2019-12-16 DIAGNOSIS — D649 Anemia, unspecified: Secondary | ICD-10-CM | POA: Diagnosis not present

## 2019-12-16 DIAGNOSIS — D709 Neutropenia, unspecified: Secondary | ICD-10-CM

## 2019-12-16 LAB — CBC WITH DIFFERENTIAL/PLATELET
Abs Immature Granulocytes: 0 10*3/uL (ref 0.00–0.07)
Basophils Absolute: 0 10*3/uL (ref 0.0–0.1)
Basophils Relative: 0 %
Eosinophils Absolute: 0 10*3/uL (ref 0.0–0.5)
Eosinophils Relative: 1 %
HCT: 32.1 % — ABNORMAL LOW (ref 36.0–46.0)
Hemoglobin: 10.5 g/dL — ABNORMAL LOW (ref 12.0–15.0)
Immature Granulocytes: 0 %
Lymphocytes Relative: 34 %
Lymphs Abs: 1.2 10*3/uL (ref 0.7–4.0)
MCH: 30.6 pg (ref 26.0–34.0)
MCHC: 32.7 g/dL (ref 30.0–36.0)
MCV: 93.6 fL (ref 80.0–100.0)
Monocytes Absolute: 0.3 10*3/uL (ref 0.1–1.0)
Monocytes Relative: 10 %
Neutro Abs: 1.9 10*3/uL (ref 1.7–7.7)
Neutrophils Relative %: 55 %
Platelets: 154 10*3/uL (ref 150–400)
RBC: 3.43 MIL/uL — ABNORMAL LOW (ref 3.87–5.11)
RDW: 13.9 % (ref 11.5–15.5)
WBC: 3.5 10*3/uL — ABNORMAL LOW (ref 4.0–10.5)
nRBC: 0 % (ref 0.0–0.2)

## 2019-12-16 LAB — IRON AND TIBC
Iron: 66 ug/dL (ref 28–170)
Saturation Ratios: 20 % (ref 10.4–31.8)
TIBC: 332 ug/dL (ref 250–450)
UIBC: 266 ug/dL

## 2019-12-16 LAB — FERRITIN: Ferritin: 124 ng/mL (ref 11–307)

## 2020-02-07 DIAGNOSIS — Z23 Encounter for immunization: Secondary | ICD-10-CM | POA: Diagnosis not present

## 2020-02-07 DIAGNOSIS — F0280 Dementia in other diseases classified elsewhere without behavioral disturbance: Secondary | ICD-10-CM | POA: Diagnosis not present

## 2020-02-07 DIAGNOSIS — G3183 Dementia with Lewy bodies: Secondary | ICD-10-CM | POA: Diagnosis not present

## 2020-02-13 DIAGNOSIS — E039 Hypothyroidism, unspecified: Secondary | ICD-10-CM | POA: Diagnosis not present

## 2020-02-13 DIAGNOSIS — Z90710 Acquired absence of both cervix and uterus: Secondary | ICD-10-CM | POA: Diagnosis not present

## 2020-02-13 DIAGNOSIS — N184 Chronic kidney disease, stage 4 (severe): Secondary | ICD-10-CM | POA: Diagnosis not present

## 2020-02-13 DIAGNOSIS — F0280 Dementia in other diseases classified elsewhere without behavioral disturbance: Secondary | ICD-10-CM | POA: Diagnosis not present

## 2020-02-13 DIAGNOSIS — G3183 Dementia with Lewy bodies: Secondary | ICD-10-CM | POA: Diagnosis not present

## 2020-02-13 DIAGNOSIS — H409 Unspecified glaucoma: Secondary | ICD-10-CM | POA: Diagnosis not present

## 2020-02-13 DIAGNOSIS — I6782 Cerebral ischemia: Secondary | ICD-10-CM | POA: Diagnosis not present

## 2020-02-13 DIAGNOSIS — R258 Other abnormal involuntary movements: Secondary | ICD-10-CM | POA: Diagnosis not present

## 2020-02-13 DIAGNOSIS — G4730 Sleep apnea, unspecified: Secondary | ICD-10-CM | POA: Diagnosis not present

## 2020-02-13 DIAGNOSIS — I129 Hypertensive chronic kidney disease with stage 1 through stage 4 chronic kidney disease, or unspecified chronic kidney disease: Secondary | ICD-10-CM | POA: Diagnosis not present

## 2020-02-13 DIAGNOSIS — Z905 Acquired absence of kidney: Secondary | ICD-10-CM | POA: Diagnosis not present

## 2020-02-13 DIAGNOSIS — D126 Benign neoplasm of colon, unspecified: Secondary | ICD-10-CM | POA: Diagnosis not present

## 2020-02-13 DIAGNOSIS — R251 Tremor, unspecified: Secondary | ICD-10-CM | POA: Diagnosis not present

## 2020-02-13 DIAGNOSIS — D631 Anemia in chronic kidney disease: Secondary | ICD-10-CM | POA: Diagnosis not present

## 2020-02-13 DIAGNOSIS — N6092 Unspecified benign mammary dysplasia of left breast: Secondary | ICD-10-CM | POA: Diagnosis not present

## 2020-02-13 DIAGNOSIS — Z9181 History of falling: Secondary | ICD-10-CM | POA: Diagnosis not present

## 2020-02-13 DIAGNOSIS — E785 Hyperlipidemia, unspecified: Secondary | ICD-10-CM | POA: Diagnosis not present

## 2020-02-15 ENCOUNTER — Ambulatory Visit: Payer: Medicare Other

## 2020-02-15 ENCOUNTER — Telehealth: Payer: Self-pay | Admitting: Internal Medicine

## 2020-02-15 DIAGNOSIS — F0280 Dementia in other diseases classified elsewhere without behavioral disturbance: Secondary | ICD-10-CM | POA: Diagnosis not present

## 2020-02-15 DIAGNOSIS — N184 Chronic kidney disease, stage 4 (severe): Secondary | ICD-10-CM | POA: Diagnosis not present

## 2020-02-15 DIAGNOSIS — R251 Tremor, unspecified: Secondary | ICD-10-CM | POA: Diagnosis not present

## 2020-02-15 DIAGNOSIS — G3183 Dementia with Lewy bodies: Secondary | ICD-10-CM | POA: Diagnosis not present

## 2020-02-15 DIAGNOSIS — R258 Other abnormal involuntary movements: Secondary | ICD-10-CM | POA: Diagnosis not present

## 2020-02-15 DIAGNOSIS — I129 Hypertensive chronic kidney disease with stage 1 through stage 4 chronic kidney disease, or unspecified chronic kidney disease: Secondary | ICD-10-CM | POA: Diagnosis not present

## 2020-02-15 NOTE — Telephone Encounter (Signed)
Okay for verbal 

## 2020-02-15 NOTE — Telephone Encounter (Signed)
Luellen Pucker called and need frequency orders on skilled nursing. 1 wk 1, 2 wk 4. Her contact number is [phone]

## 2020-02-16 NOTE — Telephone Encounter (Signed)
2x per week x 4 weeks   Thanks [facility]

## 2020-02-16 NOTE — Telephone Encounter (Signed)
Okay given

## 2020-02-17 ENCOUNTER — Telehealth: Payer: Self-pay | Admitting: Oncology

## 2020-02-17 ENCOUNTER — Inpatient Hospital Stay: Payer: Medicare Other

## 2020-02-17 ENCOUNTER — Inpatient Hospital Stay: Payer: Medicare Other | Admitting: Oncology

## 2020-02-17 NOTE — Telephone Encounter (Signed)
Patient has lewy body dementia. Unclear if she was confused. Suggest calling one more time and if she says the same thing let her pcp know to monitor her cbc

## 2020-02-17 NOTE — Telephone Encounter (Signed)
Patient missed appts on this date. Writer phoned patient to rescheduled. Patient stated that she was unaware of any appts. Writer discussed rescheduling appts and patient voiced that she wanted to cancel the appts and not reschedule as she thought the [facility] had the wrong person. Writer cancelled appts per patient's request and asked for patient to phone Portal to reschedule should she need an appt. Message sent to team informing of the above.

## 2020-02-21 DIAGNOSIS — G3183 Dementia with Lewy bodies: Secondary | ICD-10-CM | POA: Diagnosis not present

## 2020-02-21 DIAGNOSIS — R258 Other abnormal involuntary movements: Secondary | ICD-10-CM | POA: Diagnosis not present

## 2020-02-21 DIAGNOSIS — R251 Tremor, unspecified: Secondary | ICD-10-CM | POA: Diagnosis not present

## 2020-02-21 DIAGNOSIS — F0280 Dementia in other diseases classified elsewhere without behavioral disturbance: Secondary | ICD-10-CM | POA: Diagnosis not present

## 2020-02-21 DIAGNOSIS — N184 Chronic kidney disease, stage 4 (severe): Secondary | ICD-10-CM | POA: Diagnosis not present

## 2020-02-21 DIAGNOSIS — I129 Hypertensive chronic kidney disease with stage 1 through stage 4 chronic kidney disease, or unspecified chronic kidney disease: Secondary | ICD-10-CM | POA: Diagnosis not present

## 2020-02-24 ENCOUNTER — Inpatient Hospital Stay (HOSPITAL_BASED_OUTPATIENT_CLINIC_OR_DEPARTMENT_OTHER): Payer: Medicare Other | Admitting: Oncology

## 2020-02-24 ENCOUNTER — Inpatient Hospital Stay: Payer: Medicare Other | Attending: Oncology

## 2020-02-24 ENCOUNTER — Encounter: Payer: Self-pay | Admitting: Oncology

## 2020-02-24 ENCOUNTER — Other Ambulatory Visit: Payer: Self-pay

## 2020-02-24 VITALS — BP 168/76 | HR 54 | Temp 96.5°F | Resp 16 | Wt 148.2 lb

## 2020-02-24 DIAGNOSIS — E119 Type 2 diabetes mellitus without complications: Secondary | ICD-10-CM | POA: Diagnosis not present

## 2020-02-24 DIAGNOSIS — N189 Chronic kidney disease, unspecified: Secondary | ICD-10-CM | POA: Diagnosis not present

## 2020-02-24 DIAGNOSIS — E785 Hyperlipidemia, unspecified: Secondary | ICD-10-CM | POA: Diagnosis not present

## 2020-02-24 DIAGNOSIS — D649 Anemia, unspecified: Secondary | ICD-10-CM | POA: Diagnosis not present

## 2020-02-24 DIAGNOSIS — D696 Thrombocytopenia, unspecified: Secondary | ICD-10-CM

## 2020-02-24 DIAGNOSIS — F0390 Unspecified dementia without behavioral disturbance: Secondary | ICD-10-CM | POA: Insufficient documentation

## 2020-02-24 DIAGNOSIS — D709 Neutropenia, unspecified: Secondary | ICD-10-CM | POA: Diagnosis not present

## 2020-02-24 DIAGNOSIS — Z90710 Acquired absence of both cervix and uterus: Secondary | ICD-10-CM | POA: Insufficient documentation

## 2020-02-24 DIAGNOSIS — D693 Immune thrombocytopenic purpura: Secondary | ICD-10-CM | POA: Diagnosis not present

## 2020-02-24 LAB — CBC WITH DIFFERENTIAL/PLATELET
Abs Immature Granulocytes: 0 10*3/uL (ref 0.00–0.07)
Basophils Absolute: 0 10*3/uL (ref 0.0–0.1)
Basophils Relative: 0 %
Eosinophils Absolute: 0 10*3/uL (ref 0.0–0.5)
Eosinophils Relative: 1 %
HCT: 31.6 % — ABNORMAL LOW (ref 36.0–46.0)
Hemoglobin: 10.5 g/dL — ABNORMAL LOW (ref 12.0–15.0)
Immature Granulocytes: 0 %
Lymphocytes Relative: 41 %
Lymphs Abs: 1.1 10*3/uL (ref 0.7–4.0)
MCH: 31 pg (ref 26.0–34.0)
MCHC: 33.2 g/dL (ref 30.0–36.0)
MCV: 93.2 fL (ref 80.0–100.0)
Monocytes Absolute: 0.3 10*3/uL (ref 0.1–1.0)
Monocytes Relative: 10 %
Neutro Abs: 1.3 10*3/uL — ABNORMAL LOW (ref 1.7–7.7)
Neutrophils Relative %: 48 %
Platelets: 160 10*3/uL (ref 150–400)
RBC: 3.39 MIL/uL — ABNORMAL LOW (ref 3.87–5.11)
RDW: 13.3 % (ref 11.5–15.5)
WBC: 2.7 10*3/uL — ABNORMAL LOW (ref 4.0–10.5)
nRBC: 0 % (ref 0.0–0.2)

## 2020-02-27 NOTE — Progress Notes (Signed)
Hematology/Oncology Consult note [facility] [place]  [phone] [phone]  Patient Care Team: [name], Nino Glow, MD as PCP - General (Internal Medicine)   Name of the patient: Holly Mcgee  [id number]  23-Oct-1945   Date of visit: 02/27/20  Diagnosis- 1. Chronic neutropenia likely benign 2. Anemia of chronic disease  Chief complaint/ Reason for visit- routine f/u of neutropenia and anemia  Heme/Onc history: Patient is a 74 year old female with a past medical history significant for hyperlipidemia and diabetes chronic kidney disease among other medical problems who has been referred to me for anemia. She also has a history of memory loss and is here with her daughter today.Looking back at her CBCs patient's hemoglobin has been mostly stable between 10-11 at least dating back to 2016. White cell count fluctuates between 2.7-3.7 and platelet counts have been normal. She has had waxingand waning neutrophil counts between 1.3-2.0.She has not had any recurrent infections or hospitalizations. Patient denies any complaints other than fatigue at this time.  Results of blood work from 09/26/2019 showed white count of 2.7, H&H of 10/31.1 with an ANC of 1.2 and a platelet count of 153.  CMP was significant for elevated creatinine of 5.2.  Folic acid level was normal.  B12 was normal at 857.  Iron studies were normal with a ferritin of 134.  Smear review was unremarkable.  Interval history-patient has ongoing issues with dementia but is able to function with.  No major health issues since last visit.  ECOG PS- 1 Pain scale- 0   Review of systems- Review of Systems  Constitutional: Positive for malaise/fatigue. Negative for chills, fever and weight loss.  HENT: Negative for congestion, ear discharge and nosebleeds.   Eyes: Negative for blurred vision.  Respiratory: Negative for cough, hemoptysis, sputum production, shortness of breath and  wheezing.   Cardiovascular: Negative for chest pain, palpitations, orthopnea and claudication.  Gastrointestinal: Negative for abdominal pain, blood in stool, constipation, diarrhea, heartburn, melena, nausea and vomiting.  Genitourinary: Negative for dysuria, flank pain, frequency, hematuria and urgency.  Musculoskeletal: Negative for back pain, joint pain and myalgias.  Skin: Negative for rash.  Neurological: Negative for dizziness, tingling, focal weakness, seizures, weakness and headaches.  Endo/Heme/Allergies: Does not bruise/bleed easily.  Psychiatric/Behavioral: Positive for memory loss. Negative for depression and suicidal ideas. The patient does not have insomnia.       Allergies  Allergen Reactions  . Citrus Itching and Swelling     Past Medical History:  Diagnosis Date  . Abnormal Pap smear of cervix    mid 40s to 50s   . Allergy   . Anemia   . Brittle nails   . Chicken pox   . Chronic kidney disease    CKD 4/borderline 5   . 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 Emphysema of lung (HCC)    chronic bronchitis   . Fibrocystic breast changes   . Glaucoma    [name] [name]  . Heart murmur   . Hyperlipidemia   . Hypertension   . Measles    childhood  . Secondary hyperparathyroidism ([place])   . Shoulder pain   . Sleep apnea   . Thyroid disease    hypothyroidism      Past Surgical History:  Procedure Laterality Date  . ABDOMINAL HYSTERECTOMY     total 2/2 fibroids in [place] CYST REMOVAL  12/2012   Performed at [facility]  .  UTERINE FIBROID SURGERY     In [place] several years ago  . VARICOSE VEIN SURGERY     left with h/o abnormal abi   . VEIN LIGATION AND STRIPPING Left    Performed in [place]    Social History   Socioeconomic History  . Marital status: Widowed    Spouse name: Not on file  . Number of children: Not on file  . Years of education: Not on file  . Highest education level: Not on file  Occupational History  . Not  on file  Tobacco Use  . Smoking status: Never Smoker  . Smokeless tobacco: Never Used  Vaping Use  . Vaping Use: Never used  Substance and Sexual Activity  . Alcohol use: No    Alcohol/week: 0.0 standard drinks  . Drug use: No  . Sexual activity: Not Currently  Other Topics Concern  . 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 Difficulty of Paying Living Expenses: Not on file  Food Insecurity:   . Worried About Running Out of Food in the Last Year: Not on file  . Ran Out of Food in the Last Year: Not on file  Transportation Needs:   . Lack of Transportation (Medical): Not on file  . Lack of Transportation (Non-Medical): Not on file  Physical Activity:   . Days of Exercise per Week: Not on file  . Minutes of Exercise per Session: Not on file  Stress:   . Feeling of Stress : Not on file  Social Connections:   . Frequency of Communication with Friends and Family: Not on file  . Frequency of Social Gatherings with Friends and Family: Not on file  . Attends Religious Services: Not on file  . Active Member of Clubs or Organizations: Not on file  . Attends Club or Organization Meetings: Not on file  . Marital Status: Not on file  Intimate Partner Violence:   . Fear of Current or Ex-Partner: Not on file  . Emotionally Abused: Not on file  . Physically Abused: Not on file  . Sexually Abused: Not on file    Family History  Problem Relation Age of Onset  . Hypertension Mother   . Diabetes Mother   . Arthritis Mother   . Hypertension Father   . Stroke Father        complications from HTN  . Prostate cancer Father   . Alcohol abuse Brother   . COPD Brother   . Depression Brother   . Drug abuse  Brother   . Early death Brother   . Mental illness Daughter   . Diabetes Son   . Alcohol abuse Brother   . Drug abuse Brother   . Early death Brother   . Cystic kidney disease Other   . Kidney failure Cousin   . Sickle cell anemia Other   . Stomach cancer Neg Hx   . Pancreatic cancer Neg Hx   . Esophageal cancer Neg Hx      Current Outpatient Medications:  .  carvedilol (COREG) 6.25 MG tablet, Take 1 tablet (6.25 mg   total) by mouth 2 (two) times daily with a meal., Disp: 180 tablet, Rfl: 3 .  cholecalciferol (VITAMIN D3) 25 MCG (1000 UNIT) tablet, Take 1,000 Units by mouth daily., Disp: , Rfl:  .  Latanoprostene Bunod (VYZULTA) 0.024 % SOLN, Apply 1 drop to eye at bedtime. Both eyes per Dr. Roy Whitaker (Patient taking differently: Place 1 drop into both eyes at bedtime. Both eyes per Dr. Roy Whitaker), Disp: 1 Bottle, Rfl: 0 .  dorzolamide-timolol (COSOPT) 22.3-6.8 MG/ML ophthalmic solution, Place 1 drop into both eyes every morning.  (Patient not taking: Reported on 02/24/2020), Disp: , Rfl:  .  ezetimibe (ZETIA) 10 MG tablet, Take 1 tablet (10 mg total) by mouth daily. (Patient not taking: Reported on 10/14/2019), Disp: 90 tablet, Rfl: 3 .  furosemide (LASIX) 40 MG tablet, Take 1 tablet (40 mg total) by mouth daily as needed. (Patient not taking: Reported on 09/26/2019), Disp: 90 tablet, Rfl: 3 .  hydrALAZINE (APRESOLINE) 25 MG tablet, Take 1 tablet (25 mg total) by mouth 2 (two) times daily. (Patient not taking: Reported on 09/26/2019), Disp: 180 tablet, Rfl: 3 .  Multiple Vitamin (MULTIVITAMIN WITH MINERALS) TABS tablet, Take 1 tablet by mouth daily with lunch. (Patient not taking: Reported on 02/24/2020), Disp: , Rfl:  .  rivastigmine (EXELON) 1.5 MG capsule, Take 1.5 mg by mouth 2 (two) times daily. (Patient not taking: Reported on 02/24/2020), Disp: , Rfl:   Physical exam:  Vitals:   02/24/20 0950  BP: (!) 168/76  Pulse: (!) 54  Resp: 16  Temp: (!) 96.5 F (35.8 C)  TempSrc:  Tympanic  SpO2: 100%  Weight: 148 lb 3.2 oz (67.2 kg)   Physical Exam Constitutional:      General: She is not in acute distress. Cardiovascular:     Rate and Rhythm: Normal rate and regular rhythm.     Heart sounds: Normal heart sounds.  Pulmonary:     Effort: Pulmonary effort is normal.     Breath sounds: Normal breath sounds.  Abdominal:     General: Bowel sounds are normal.     Palpations: Abdomen is soft.  Skin:    General: Skin is warm and dry.  Neurological:     Mental Status: She is alert and oriented to person, place, and time.      CMP Latest Ref Rng & Units 09/26/2019  Glucose 70 - 99 mg/dL 100(H)  BUN 8 - 23 mg/dL 64(H)  Creatinine 0.44 - 1.00 mg/dL 5.29(H)  Sodium 135 - 145 mmol/L 136  Potassium 3.5 - 5.1 mmol/L 4.7  Chloride 98 - 111 mmol/L 106  CO2 22 - 32 mmol/L 20(L)  Calcium 8.9 - 10.3 mg/dL 8.7(L)  Total Protein 6.5 - 8.1 g/dL 7.2  Total Bilirubin 0.3 - 1.2 mg/dL 0.7  Alkaline Phos 38 - 126 U/L 57  AST 15 - 41 U/L 19  ALT 0 - 44 U/L 16   CBC Latest Ref Rng & Units 02/24/2020  WBC 4.0 - 10.5 K/uL 2.7(L)  Hemoglobin 12.0 - 15.0 g/dL 10.5(L)  Hematocrit 36 - 46 % 31.6(L)  Platelets 150 - 400 K/uL 160    Assessment and plan- Patient is a 74 y.o. female who is here for follow-up of neutropenia and anemia  1.  Neutropenia:[name] white count presently is 2.7 with an ANC of 1.3.  Looking back at her prior counts her ANC has fluctuated between 1.2-2.  There is no consistent downward trend in her neutrophil count.  I am inclined to   monitor this conservatively without the need for bone marrow biopsy.  2. Normocytic anemia hemoglobin is remained stable between 10-11 for the last 4 to 5 years.  Anemia work-up in the past has been unremarkable.  I will repeat CBC ferritin and iron studies as well as B12 and folate in 6 months and see her in person   Visit Diagnosis 1. Normocytic anemia   2. Thrombocytopenia, unspecified ([place])       Dr. Randa Evens, MD,  [facility] [phone] 02/27/2020 8:16 AM

## 2020-03-03 NOTE — Telephone Encounter (Signed)
Error.  Donyae Kohn,cma  

## 2020-03-20 DIAGNOSIS — D573 Sickle-cell trait: Secondary | ICD-10-CM | POA: Diagnosis not present

## 2020-03-20 DIAGNOSIS — G3183 Dementia with Lewy bodies: Secondary | ICD-10-CM | POA: Diagnosis not present

## 2020-03-20 DIAGNOSIS — F0280 Dementia in other diseases classified elsewhere without behavioral disturbance: Secondary | ICD-10-CM | POA: Diagnosis not present

## 2020-03-27 DIAGNOSIS — I129 Hypertensive chronic kidney disease with stage 1 through stage 4 chronic kidney disease, or unspecified chronic kidney disease: Secondary | ICD-10-CM | POA: Diagnosis not present

## 2020-03-27 DIAGNOSIS — G3183 Dementia with Lewy bodies: Secondary | ICD-10-CM | POA: Diagnosis not present

## 2020-03-27 DIAGNOSIS — N184 Chronic kidney disease, stage 4 (severe): Secondary | ICD-10-CM | POA: Diagnosis not present

## 2020-03-27 DIAGNOSIS — F0280 Dementia in other diseases classified elsewhere without behavioral disturbance: Secondary | ICD-10-CM | POA: Diagnosis not present

## 2020-03-27 DIAGNOSIS — R251 Tremor, unspecified: Secondary | ICD-10-CM | POA: Diagnosis not present

## 2020-03-27 DIAGNOSIS — D631 Anemia in chronic kidney disease: Secondary | ICD-10-CM | POA: Diagnosis not present

## 2020-03-27 DIAGNOSIS — R258 Other abnormal involuntary movements: Secondary | ICD-10-CM | POA: Diagnosis not present

## 2020-04-02 ENCOUNTER — Telehealth: Payer: Self-pay

## 2020-04-02 ENCOUNTER — Ambulatory Visit: Payer: Medicare Other

## 2020-04-02 NOTE — Telephone Encounter (Signed)
Unsuccessful attempt to reach patient for scheduled awv. No answer. Unable to leave voice mail. Please reschedule as appropriate.

## 2020-06-06 DIAGNOSIS — H2513 Age-related nuclear cataract, bilateral: Secondary | ICD-10-CM | POA: Diagnosis not present

## 2020-06-06 DIAGNOSIS — H35372 Puckering of macula, left eye: Secondary | ICD-10-CM | POA: Diagnosis not present

## 2020-06-06 DIAGNOSIS — H401134 Primary open-angle glaucoma, bilateral, indeterminate stage: Secondary | ICD-10-CM | POA: Diagnosis not present

## 2020-06-06 DIAGNOSIS — H3581 Retinal edema: Secondary | ICD-10-CM | POA: Diagnosis not present

## 2020-06-06 DIAGNOSIS — H3562 Retinal hemorrhage, left eye: Secondary | ICD-10-CM | POA: Diagnosis not present

## 2020-06-14 ENCOUNTER — Ambulatory Visit (INDEPENDENT_AMBULATORY_CARE_PROVIDER_SITE_OTHER): Payer: Medicare Other | Admitting: Ophthalmology

## 2020-06-14 ENCOUNTER — Other Ambulatory Visit: Payer: Self-pay

## 2020-06-14 ENCOUNTER — Encounter (INDEPENDENT_AMBULATORY_CARE_PROVIDER_SITE_OTHER): Payer: Self-pay | Admitting: Ophthalmology

## 2020-06-14 DIAGNOSIS — H35352 Cystoid macular degeneration, left eye: Secondary | ICD-10-CM

## 2020-06-14 DIAGNOSIS — H2513 Age-related nuclear cataract, bilateral: Secondary | ICD-10-CM

## 2020-06-14 DIAGNOSIS — H34832 Tributary (branch) retinal vein occlusion, left eye, with macular edema: Secondary | ICD-10-CM

## 2020-06-14 DIAGNOSIS — H401133 Primary open-angle glaucoma, bilateral, severe stage: Secondary | ICD-10-CM | POA: Insufficient documentation

## 2020-06-14 NOTE — Assessment & Plan Note (Signed)

## 2020-06-14 NOTE — Progress Notes (Signed)
06/14/2020     CHIEF COMPLAINT Patient presents for Eye Exam (Pt referred by Dr. Katy Fitch for mac edema OS, old BRVO OS/Pt states she has not noticed any blurry or distorted vision. Denies FOL and floaters. Pt states when she wears her new gls they affect her eyes. Using glaucoma gtts as directed.)   HISTORY OF PRESENT ILLNESS: Holly Mcgee is a 75 y.o. female who presents to the clinic today for:   HPI    Eye Exam    In both eyes.  Characterized as blurry vision.  Severity is mild.  I, the attending physician,  performed the HPI with the patient and updated documentation appropriately. Additional comments: Pt referred by Dr. Katy Fitch for mac edema OS, old BRVO OS Pt states she has not noticed any blurry or distorted vision. Denies FOL and floaters. Pt states when she wears her new gls they affect her eyes. Using glaucoma gtts as directed.       Last edited by Tilda Franco on 06/14/2020  9:12 AM. (History)      Referring physician: [name], Nino Glow, MD [place],  [place] [id number]  HISTORICAL INFORMATION:   Selected notes from the MEDICAL RECORD NUMBER    Lab Results  Component Value Date   HGBA1C 5.6 11/19/2018     CURRENT MEDICATIONS: Current Outpatient Medications (Ophthalmic Drugs)  Medication Sig  . dorzolamide-timolol (COSOPT) 22.3-6.8 MG/ML ophthalmic solution Place 1 drop into both eyes every morning.  (Patient not taking: Reported on 02/24/2020)  . Latanoprostene Bunod (VYZULTA) 0.024 % SOLN Apply 1 drop to eye at bedtime. Both eyes per [name] (Patient taking differently: Place 1 drop into both eyes at bedtime. Both eyes per [name])   No current facility-administered medications for this visit. (Ophthalmic Drugs)   Current Outpatient Medications (Other)  Medication Sig  . carvedilol (COREG) 6.25 MG tablet Take 1 tablet (6.25 mg total) by mouth 2 (two) times daily with a meal.  . cholecalciferol (VITAMIN D3) 25 MCG  (1000 UNIT) tablet Take 1,000 Units by mouth daily.  Marland Kitchen ezetimibe (ZETIA) 10 MG tablet Take 1 tablet (10 mg total) by mouth daily. (Patient not taking: Reported on 10/14/2019)  . furosemide (LASIX) 40 MG tablet Take 1 tablet (40 mg total) by mouth daily as needed. (Patient not taking: Reported on 09/26/2019)  . hydrALAZINE (APRESOLINE) 25 MG tablet Take 1 tablet (25 mg total) by mouth 2 (two) times daily. (Patient not taking: Reported on 09/26/2019)  . Multiple Vitamin (MULTIVITAMIN WITH MINERALS) TABS tablet Take 1 tablet by mouth daily with lunch. (Patient not taking: Reported on 02/24/2020)  . rivastigmine (EXELON) 1.5 MG capsule Take 1.5 mg by mouth 2 (two) times daily. (Patient not taking: Reported on 02/24/2020)   No current facility-administered medications for this visit. (Other)      REVIEW OF SYSTEMS:    ALLERGIES Allergies  Allergen Reactions  . Citrus Itching and Swelling    PAST MEDICAL HISTORY Past Medical History:  Diagnosis Date  . Abnormal Pap smear of cervix    mid 40s to 50s   . Allergy   . Anemia   . Brittle nails   . Chicken pox   . Chronic kidney disease    CKD 4/borderline 5   . 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 Emphysema of lung (HCC)    chronic bronchitis   . Fibrocystic breast changes   . Glaucoma  [name] [name]  . Heart murmur   . Hyperlipidemia   . Hypertension   . Measles    childhood  . Secondary hyperparathyroidism ([place])   . Shoulder pain   . Sleep apnea   . Thyroid disease    hypothyroidism    Past Surgical History:  Procedure Laterality Date  . ABDOMINAL HYSTERECTOMY     total 2/2 fibroids in [place] CYST REMOVAL  12/2012   Performed at [facility]  . UTERINE FIBROID SURGERY     In [place] several years ago  . VARICOSE VEIN SURGERY     left with h/o abnormal abi   . VEIN LIGATION AND STRIPPING Left    Performed in [place]    FAMILY HISTORY Family History  Problem Relation Age of Onset  .  Hypertension Mother   . Diabetes Mother   . Arthritis Mother   . Hypertension Father   . Stroke Father        complications from HTN  . Prostate cancer Father   . Alcohol abuse Brother   . COPD Brother   . Depression Brother   . Drug abuse Brother   . Early death Brother   . Mental illness Daughter   . Diabetes Son   . Alcohol abuse Brother   . Drug abuse Brother   . Early death Brother   . Cystic kidney disease Other   . Kidney failure Cousin   . Sickle cell anemia Other   . Stomach cancer Neg Hx   . Pancreatic cancer Neg Hx   . Esophageal cancer Neg Hx     SOCIAL HISTORY Social History   Tobacco Use  . Smoking status: Never Smoker  . Smokeless tobacco: Never Used  Vaping Use  . Vaping Use: Never used  Substance Use Topics  . Alcohol use: No    Alcohol/week: 0.0 standard drinks  . Drug use: No         OPHTHALMIC EXAM:  Base Eye Exam    Visual Acuity (Snellen - Linear)      Right Left   Dist [place] 20/30 -2 20/60   Dist ph [place]  20/40       Tonometry (Tonopen, 9:23 AM)      Right Left   Pressure 12 15       Pupils      Pupils Dark Light Shape React APD   Right PERRL 3 3 Round Minimal None   Left PERRL 3 3 Round Minimal None       Visual Fields (Counting fingers)      Left Right    Full Full       Neuro/Psych    Oriented x3: Yes   Mood/Affect: Normal       Dilation    Both eyes: 1.0% Mydriacyl, 2.5% Phenylephrine @ 9:23 AM        Slit Lamp and Fundus Exam    External Exam      Right Left   External Normal Normal       Slit Lamp Exam      Right Left   Lids/Lashes Normal Normal   Conjunctiva/Sclera White and quiet White and quiet   Cornea Clear Clear   Anterior Chamber Deep and quiet Deep and quiet   Iris Round and reactive Round and reactive   Lens 2+ Nuclear sclerosis 2+ Nuclear sclerosis   Anterior Vitreous Normal Normal       Fundus Exam  Right Left   Posterior Vitreous Posterior vitreous detachment Posterior vitreous  detachment   Disc 1+ Pallor 1+ Pallor   C/D Ratio 0.85 0.9   Macula Normal Microaneurysms, Cystoid macular edema   Vessels Normal Inferior branch retinal vein occlusion, shunt vessels temporal to macula   Periphery Normal Regions of retinal nonperfusion inferiorly          IMAGING AND PROCEDURES  Imaging and Procedures for 06/14/20  OCT, Retina - OU - Both Eyes       Right Eye Quality was good. Scan locations included subfoveal. Central Foveal Thickness: 202. Progression has no prior data. Findings include abnormal foveal contour.   Left Eye Quality was good. Scan locations included subfoveal. Central Foveal Thickness: 237. Progression has no prior data. Findings include abnormal foveal contour, cystoid macular edema.   Notes OD with diffuse macular atrophy.  OS with diffuse CME temporally in region inferotemporal to the nerve, yet inferonasal to the fovea not related to the CME temporally                ASSESSMENT/PLAN:  Nuclear sclerotic cataract of both eyes The nature of cataract was discussed with the patient as well as the elective nature of surgery. The patient was reassured that surgery at a later date does not put the patient at risk for a worse outcome. It was emphasized that the need for surgery is dictated by the patient's quality of life as influenced by the cataract. Patient was instructed to maintain close follow up with their general eye care doctor.  Primary open angle glaucoma of both eyes, severe stage Follow-up with Dr. Nathanial Rancher as scheduled for glaucoma monitoring  Branch retinal vein occlusion of left eye with macular edema The nature of branch retinal vein occlusion with macular edema was discussed.  The patient was given access to printed information.  The treatment options including continued observation looking for spontaneous resolution versus grid laser versus intravitreal Kenalog injection were discussed.  PRIMARY THERAPY CONSISTS of  Anti-VEGF Therapies, AVASTIN, LUCENTIS AND EYLEA.  Their usage was discussed to assist in halting the progression of Macular Edema, in order to preserve, protect or improve acuity.  Additionally, at times, limited focal laser therapy is used in the management.  The risks and benefits of all these options were discussed with the patient.  The patient's questions were answered.      ICD-10-CM   1. 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 Cystoid macular degeneration of left eye  H35.352 OCT, Retina - OU - Both Eyes  3. Nuclear sclerotic cataract of both eyes  H25.13   4. Primary open angle glaucoma of both eyes, severe stage  H40.1133     1.  2.  3.  Ophthalmic Meds Ordered this visit:  No orders of the defined types were placed in this encounter.      Return in about 1 week (around 06/21/2020) for DILATE OU, OPTOS FFA L/R, COLOR FP, AVASTIN OCT, OS.  Patient Instructions  Central Retinal Vein Occlusion, Adult  Central retinal vein occlusion (CRVO) is a blockage in the main blood vessel that carries blood away from the retina (central retinal vein). The retina is the part of your eye that senses light and sends signals to the brain that allow you to see. CRVO can cause blurry vision. It can also cause complete or partial vision loss. The condition usually affects only one  eye. What are the causes? This condition is usually caused by a blood clot that forms inside the central retinal vein. A clot is blood that has thickened into a gel or solid. Vision loss happens because the blockage in the vein causes fluid to leak out of the vein and collect in the retina. A clot can block the central retinal vein because of:  A narrowing of the arteries.  A slowing of the bloodstream.  Changes in the blood vessel wall.  Changes in the blood's thickness. Sometimes the cause is not known. What increases the risk? This condition is  more likely to develop in:  People who have a blood vessel disease that causes narrowing of the arteries. This is the main risk factor for this condition.  People who are age 75 or older.  Women who are using oral contraceptive pills.  People who have any of the following medical conditions: ? High blood pressure. ? Diabetes. ? Heart disease. ? High levels of fat in the blood. ? Increased pressure inside the eye. ? Disorders that increase blood clotting. What are the signs or symptoms? Symptoms of this condition include:  Sudden and painless vision loss. If the vision loss is partial, it may get worse over a span of hours or days.  Sudden and painless blurry vision.  Tiny spots or clumps that move across your vision.  Pain or pressure in your eye. This is rare. How is this diagnosed? This condition is usually diagnosed by a health care provider who specializes in eye diseases (ophthalmologist). This condition may be diagnosed based on an eye exam. The health care provider may:  Use eye drops to help check your eyes.  Order tests that help diagnose the condition, such as: ? A vision test. ? A measurement of the pressure inside your eye. ? A fluorescein angiogram. In this test, pictures are taken of your retina after a dye is injected into your bloodstream. ? Optical coherence tomography. In this test, light waves are used to create pictures of your retina.  Check your blood pressure.  Order tests to find the cause of your condition and rule out other conditions. The tests may check for these problems: ? Diabetes. ? High levels of fat or cholesterol in your blood. ? Abnormal blood clotting. How is this treated? There is no cure for this condition. The goals of treatment are to save as much of your vision as possible and to prevent the condition from happening again. Treatment may include:  Medicine to reduce swelling in your eye. The medicine may be injected in your eye, or  an implant may be placed in your eye to release the medicine.  Medicine to reduce abnormal fluid and new blood vessel growth in your eye. The medicine is injected in your eye.  Laser treatment to reduce swelling and stop fluid leakage in the eye (focal laser treatment).  Laser treatment to seal or destroy abnormal blood vessels (pan-retinal laser treatment). Follow these instructions at home:  Use over-the-counter and prescription medicines only as told by your health care provider.  Keep all follow-up visits. This is important. How is this prevented? To help prevent this condition:  Work with your health care providers to reduce your risk factors.  Do not use any products that contain nicotine or tobacco. These products include cigarettes, chewing tobacco, and vaping devices, such as e-cigarettes. If you need help quitting, ask your health care provider.  Follow a heart-healthy diet. This includes eating  plenty of fruits, vegetables, whole grains, and lean protein, and avoiding foods that are high in saturated fat and sugar.  Maintain a healthy weight.  Exercise regularly. Get help right away if:  You have eye pain.  You have symptoms of this condition, including blurry vision or floaters.  You have new or sudden vision loss. Summary  Central retinal vein occlusion (CRVO) is a blockage in the main blood vessel that carries blood away from the retina (central retinal vein). The retina is the part of your eye that senses light and sends signals to the brain that allow you to see.  CRVO can cause blurry vision. It can also cause complete or partial vision loss. The condition usually affects only one eye.  There is no cure for this condition. The goals of treatment are to save as much of your vision as possible and to prevent the condition from happening again.  Get help right away if you have new or sudden vision loss. This information is not intended to replace advice given to  you by your health care provider. Make sure you discuss any questions you have with your health care provider. Document Revised: 03/06/2020 Document Reviewed: 03/06/2020 Elsevier Patient Education  2021 [place] the diagnoses, plan, and follow up with the patient and they expressed understanding.  Patient expressed understanding of the importance of proper follow up care.   Clent Demark Fatimah Sundquist M.D. Diseases & Surgery of the Retina and Vitreous [facility] [place] 06/14/20     Abbreviations: M myopia (nearsighted); A astigmatism; H hyperopia (farsighted); P presbyopia; Mrx spectacle prescription;  CTL contact lenses; OD right eye; OS left eye; OU both eyes  XT exotropia; ET esotropia; PEK punctate epithelial keratitis; PEE punctate epithelial erosions; DES dry eye syndrome; MGD meibomian gland dysfunction; ATs artificial tears; PFAT's preservative free artificial tears; [place] nuclear sclerotic cataract; PSC posterior subcapsular cataract; ERM epi-retinal membrane; PVD posterior vitreous detachment; RD retinal detachment; DM diabetes mellitus; DR diabetic retinopathy; NPDR non-proliferative diabetic retinopathy; PDR proliferative diabetic retinopathy; CSME clinically significant macular edema; DME diabetic macular edema; dbh dot blot hemorrhages; CWS cotton wool spot; POAG primary open angle glaucoma; C/D cup-to-disc ratio; HVF humphrey visual field; GVF goldmann visual field; OCT optical coherence tomography; IOP intraocular pressure; BRVO Branch retinal vein occlusion; CRVO central retinal vein occlusion; CRAO central retinal artery occlusion; BRAO branch retinal artery occlusion; RT retinal tear; SB scleral buckle; PPV pars plana vitrectomy; VH Vitreous hemorrhage; PRP panretinal laser photocoagulation; IVK intravitreal kenalog; VMT vitreomacular traction; MH Macular hole;  NVD neovascularization of the disc; NVE neovascularization elsewhere; AREDS age related eye disease  study; ARMD age related macular degeneration; POAG primary open angle glaucoma; EBMD epithelial/anterior basement membrane dystrophy; ACIOL anterior chamber intraocular lens; IOL intraocular lens; PCIOL posterior chamber intraocular lens; Phaco/IOL phacoemulsification with intraocular lens placement; [place] photorefractive keratectomy; LASIK laser assisted in situ keratomileusis; HTN hypertension; DM diabetes mellitus; COPD chronic obstructive pulmonary disease

## 2020-06-14 NOTE — Assessment & Plan Note (Signed)
Follow-up with Dr. Nathanial Rancher as scheduled for glaucoma monitoring

## 2020-06-14 NOTE — Assessment & Plan Note (Signed)
The nature of branch retinal vein occlusion with macular edema was discussed.  The patient was given access to printed information.  The treatment options including continued observation looking for spontaneous resolution versus grid laser versus intravitreal Kenalog injection were discussed.  PRIMARY THERAPY CONSISTS of Anti-VEGF Therapies, AVASTIN, LUCENTIS AND EYLEA.  Their usage was discussed to assist in halting the progression of Macular Edema, in order to preserve, protect or improve acuity.  Additionally, at times, limited focal laser therapy is used in the management.  The risks and benefits of all these options were discussed with the patient.  The patient's questions were answered. 

## 2020-06-14 NOTE — Patient Instructions (Signed)
Central Retinal Vein Occlusion, Adult  Central retinal vein occlusion (CRVO) is a blockage in the main blood vessel that carries blood away from the retina (central retinal vein). The retina is the part of your eye that senses light and sends signals to the brain that allow you to see. CRVO can cause blurry vision. It can also cause complete or partial vision loss. The condition usually affects only one eye. What are the causes? This condition is usually caused by a blood clot that forms inside the central retinal vein. A clot is blood that has thickened into a gel or solid. Vision loss happens because the blockage in the vein causes fluid to leak out of the vein and collect in the retina. A clot can block the central retinal vein because of:  A narrowing of the arteries.  A slowing of the bloodstream.  Changes in the blood vessel wall.  Changes in the blood's thickness. Sometimes the cause is not known. What increases the risk? This condition is more likely to develop in:  People who have a blood vessel disease that causes narrowing of the arteries. This is the main risk factor for this condition.  People who are age 55 or older.  Women who are using oral contraceptive pills.  People who have any of the following medical conditions: ? High blood pressure. ? Diabetes. ? Heart disease. ? High levels of fat in the blood. ? Increased pressure inside the eye. ? Disorders that increase blood clotting. What are the signs or symptoms? Symptoms of this condition include:  Sudden and painless vision loss. If the vision loss is partial, it may get worse over a span of hours or days.  Sudden and painless blurry vision.  Tiny spots or clumps that move across your vision.  Pain or pressure in your eye. This is rare. How is this diagnosed? This condition is usually diagnosed by a health care provider who specializes in eye diseases (ophthalmologist). This condition may be diagnosed based  on an eye exam. The health care provider may:  Use eye drops to help check your eyes.  Order tests that help diagnose the condition, such as: ? A vision test. ? A measurement of the pressure inside your eye. ? A fluorescein angiogram. In this test, pictures are taken of your retina after a dye is injected into your bloodstream. ? Optical coherence tomography. In this test, light waves are used to create pictures of your retina.  Check your blood pressure.  Order tests to find the cause of your condition and rule out other conditions. The tests may check for these problems: ? Diabetes. ? High levels of fat or cholesterol in your blood. ? Abnormal blood clotting. How is this treated? There is no cure for this condition. The goals of treatment are to save as much of your vision as possible and to prevent the condition from happening again. Treatment may include:  Medicine to reduce swelling in your eye. The medicine may be injected in your eye, or an implant may be placed in your eye to release the medicine.  Medicine to reduce abnormal fluid and new blood vessel growth in your eye. The medicine is injected in your eye.  Laser treatment to reduce swelling and stop fluid leakage in the eye (focal laser treatment).  Laser treatment to seal or destroy abnormal blood vessels (pan-retinal laser treatment). Follow these instructions at home:  Use over-the-counter and prescription medicines only as told by your health care provider.    Keep all follow-up visits. This is important. How is this prevented? To help prevent this condition:  Work with your health care providers to reduce your risk factors.  Do not use any products that contain nicotine or tobacco. These products include cigarettes, chewing tobacco, and vaping devices, such as e-cigarettes. If you need help quitting, ask your health care provider.  Follow a heart-healthy diet. This includes eating plenty of fruits, vegetables,  whole grains, and lean protein, and avoiding foods that are high in saturated fat and sugar.  Maintain a healthy weight.  Exercise regularly. Get help right away if:  You have eye pain.  You have symptoms of this condition, including blurry vision or floaters.  You have new or sudden vision loss. Summary  Central retinal vein occlusion (CRVO) is a blockage in the main blood vessel that carries blood away from the retina (central retinal vein). The retina is the part of your eye that senses light and sends signals to the brain that allow you to see.  CRVO can cause blurry vision. It can also cause complete or partial vision loss. The condition usually affects only one eye.  There is no cure for this condition. The goals of treatment are to save as much of your vision as possible and to prevent the condition from happening again.  Get help right away if you have new or sudden vision loss. This information is not intended to replace advice given to you by your health care provider. Make sure you discuss any questions you have with your health care provider. Document Revised: 03/06/2020 Document Reviewed: 03/06/2020 Elsevier Patient Education  2021 Elsevier Inc.  

## 2020-06-15 IMAGING — CT CT HEAD W/O CM
3 of 6 series · 15 of 47 positions shown, 18 images · non-contrast
Comparison: CT head 03/27/2014.

CLINICAL DATA: Altered level of consciousness.

EXAM:
CT HEAD WITHOUT CONTRAST
TECHNIQUE: Contiguous axial images were obtained from the base of the skull
through the vertex without intravenous contrast.

[Series 3: head (person_name) (person_name) · axial · 0.41mm/px · z∈[-136,-6]mm · 10 of 30 slices shown, 13 images]
[im 2/30  brain]
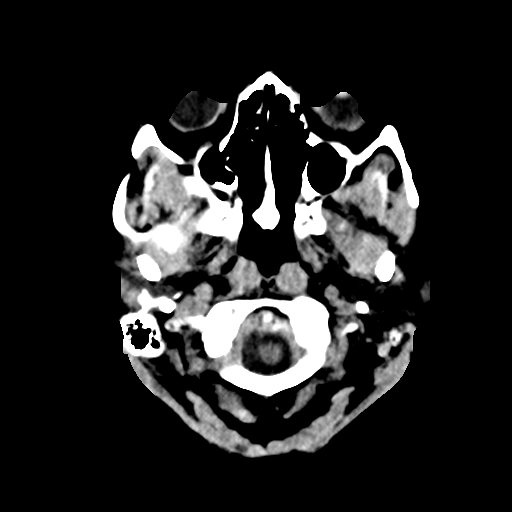
[im 2/30  bone]
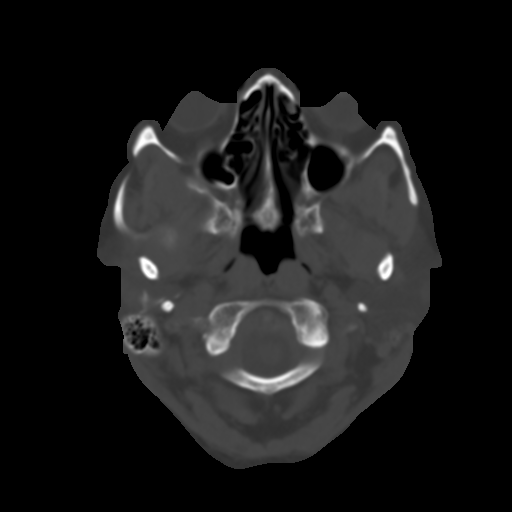
[im 5/30  brain]
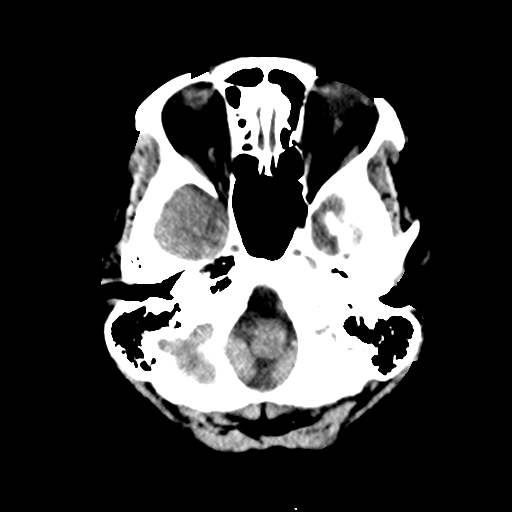
[im 8/30  brain]
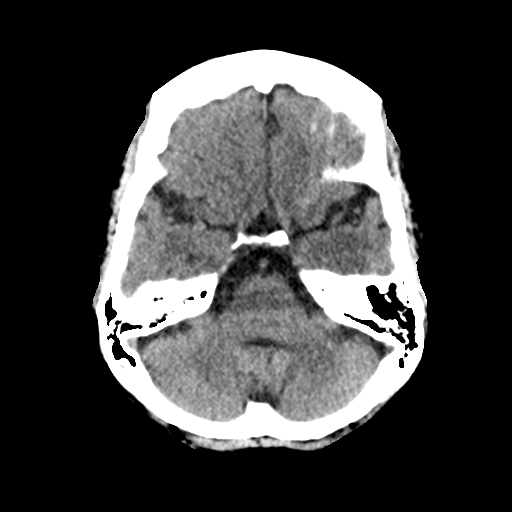
[im 11/30  brain]
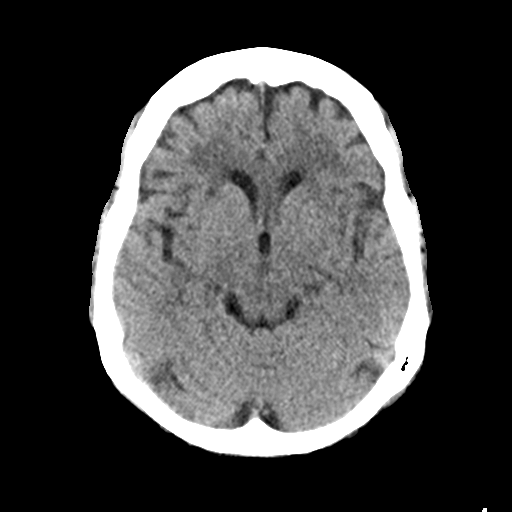
[im 14/30  brain]
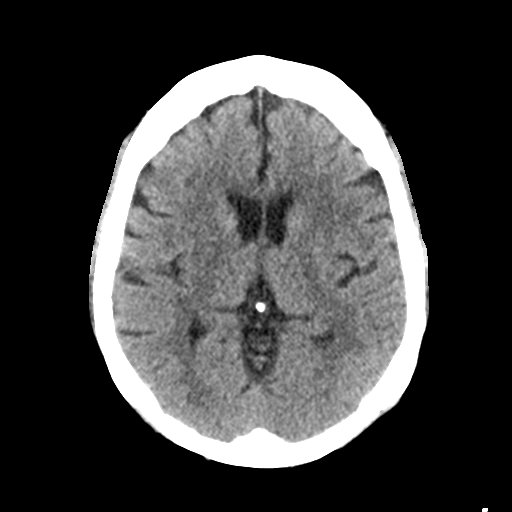
[im 14/30  bone]
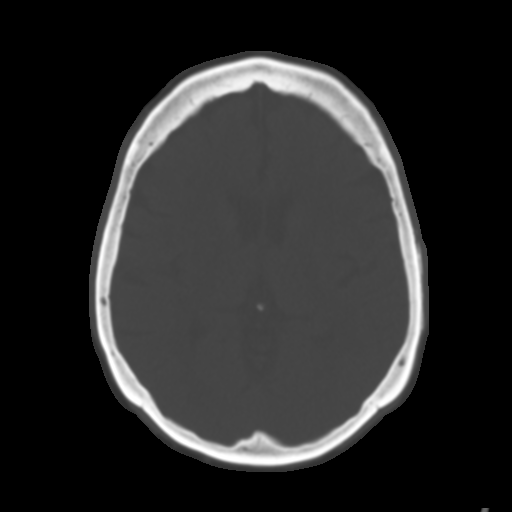
[im 16/30  brain]
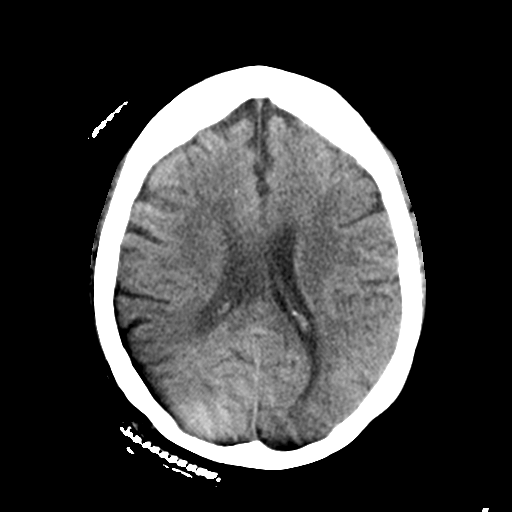
[im 19/30  brain]
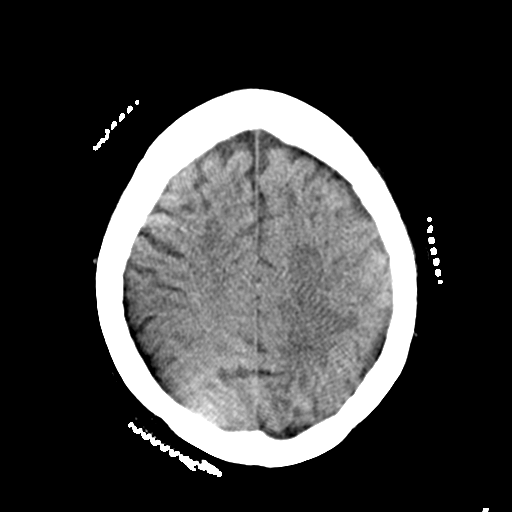
[im 22/30  brain]
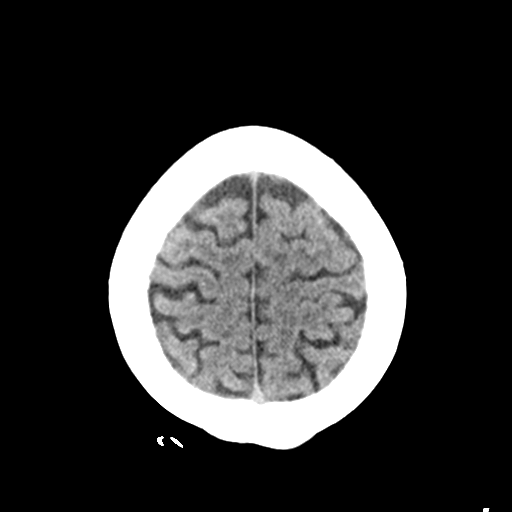
[im 25/30  brain]
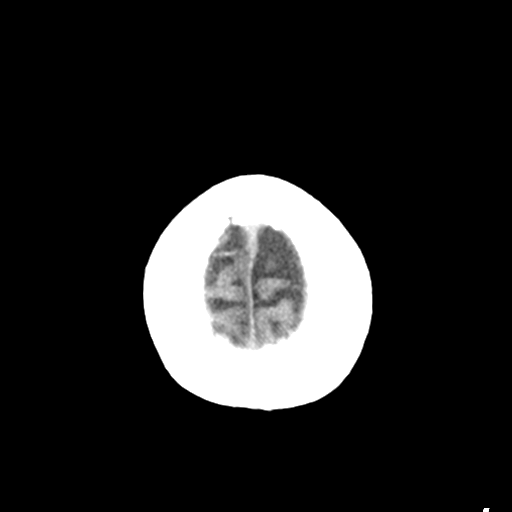
[im 25/30  bone]
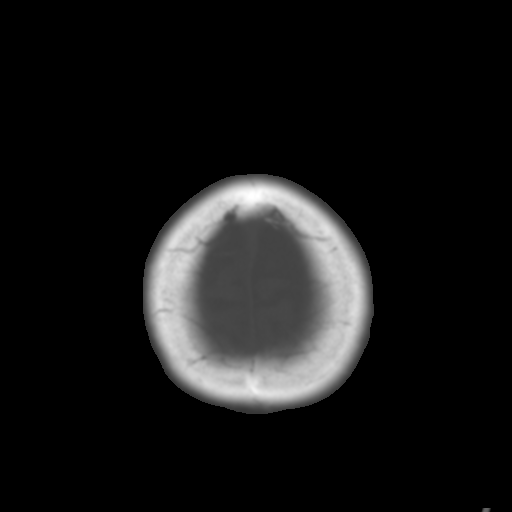
[im 28/30  brain]
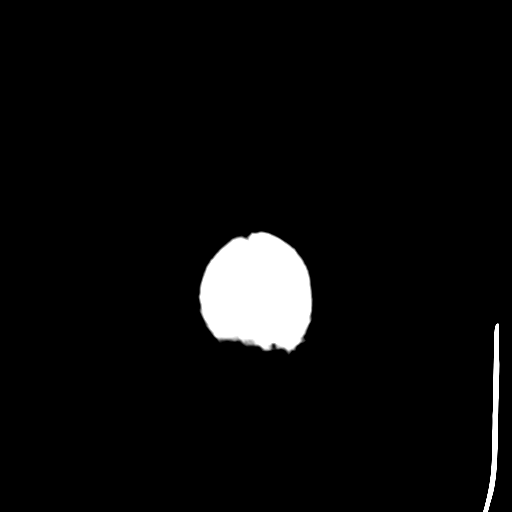

[Series 6: coronal soft tissue · coronal · 0.30mm/px · 3 of 62 slices shown]
[im 16/62  brain]
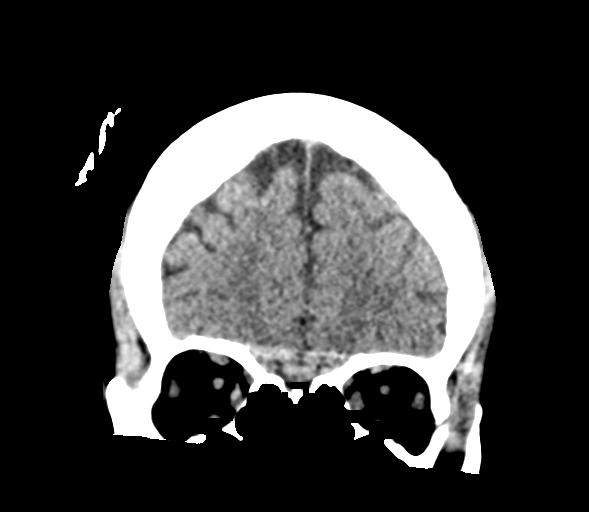
[im 31/62  brain]
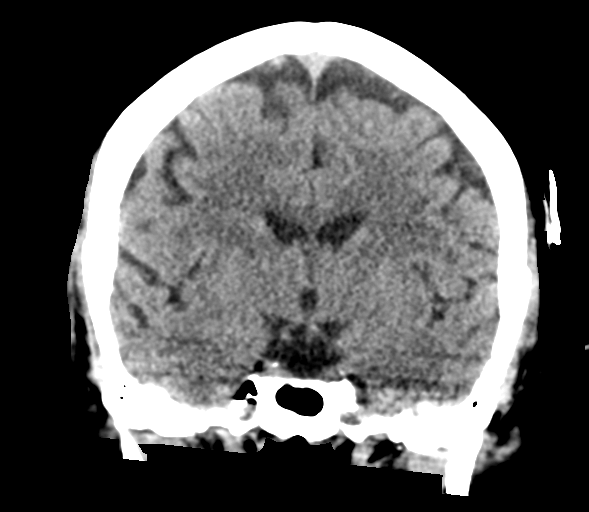
[im 46/62  brain]
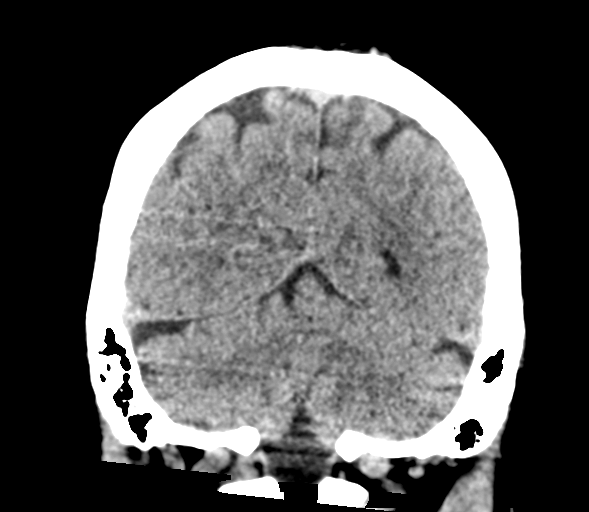

[Series 7: sagittal soft tissue · sagittal · 0.30mm/px · 2 of 52 slices shown]
[im 18/52  brain]
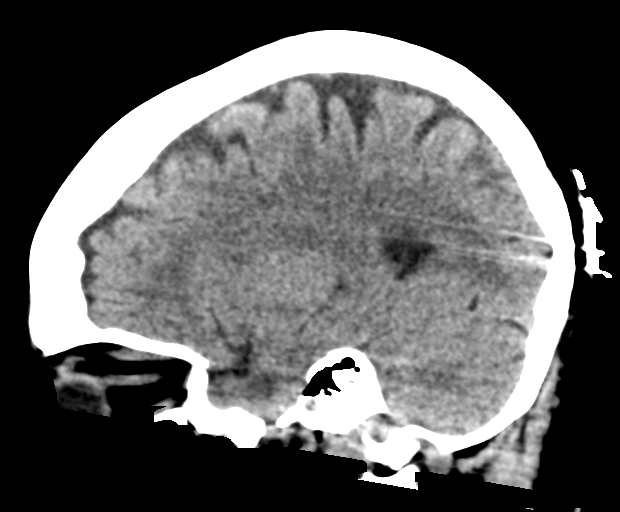
[im 35/52  brain]
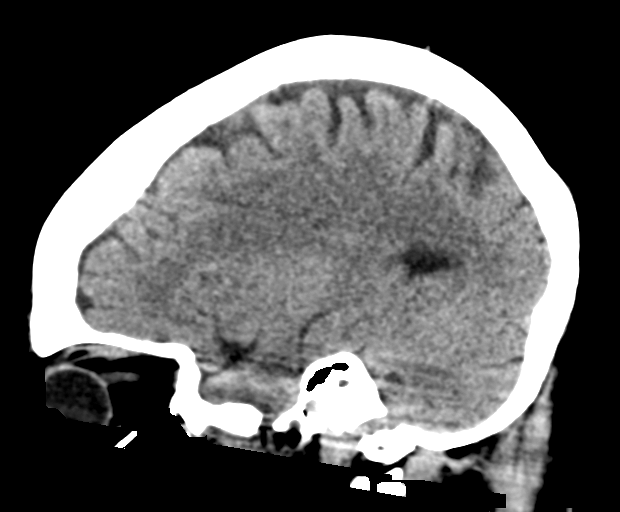

[15 of 47 positions shown; findings below may reference images not displayed]

FINDINGS: Brain: Study is mildly degraded by artifact from overlying hair
pins. There is no evidence of acute intracranial hemorrhage, mass
lesion, brain edema or extra-axial fluid collection. The ventricles
and subarachnoid spaces are appropriately sized for age. There is no
CT evidence of acute cortical infarction. There are stable mild
chronic small vessel ischemic changes in the periventricular white
matter.

Vascular:  No hyperdense vessel identified.

Skull: Negative for fracture or focal lesion.

Sinuses/Orbits: The visualized paranasal sinuses and mastoid air
cells are clear. No orbital abnormalities are seen.

Other: None.
IMPRESSION: Stable head CT without evidence of acute intracranial process. Mild
chronic small vessel ischemic changes.

## 2020-06-21 ENCOUNTER — Encounter (INDEPENDENT_AMBULATORY_CARE_PROVIDER_SITE_OTHER): Payer: Medicare Other | Admitting: Ophthalmology

## 2020-07-16 ENCOUNTER — Encounter (INDEPENDENT_AMBULATORY_CARE_PROVIDER_SITE_OTHER): Payer: Medicare Other | Admitting: Ophthalmology

## 2020-07-30 DIAGNOSIS — E538 Deficiency of other specified B group vitamins: Secondary | ICD-10-CM | POA: Diagnosis not present

## 2020-07-30 DIAGNOSIS — Z79899 Other long term (current) drug therapy: Secondary | ICD-10-CM | POA: Diagnosis not present

## 2020-07-30 DIAGNOSIS — F0280 Dementia in other diseases classified elsewhere without behavioral disturbance: Secondary | ICD-10-CM | POA: Diagnosis not present

## 2020-07-30 DIAGNOSIS — G3183 Dementia with Lewy bodies: Secondary | ICD-10-CM | POA: Diagnosis not present

## 2020-08-21 DIAGNOSIS — R899 Unspecified abnormal finding in specimens from other organs, systems and tissues: Secondary | ICD-10-CM | POA: Diagnosis not present

## 2020-08-24 ENCOUNTER — Inpatient Hospital Stay: Payer: Medicare Other | Admitting: Oncology

## 2020-08-24 ENCOUNTER — Inpatient Hospital Stay: Payer: Medicare Other

## 2020-09-26 ENCOUNTER — Ambulatory Visit: Payer: Medicare Other | Admitting: Internal Medicine

## 2020-09-27 ENCOUNTER — Other Ambulatory Visit: Payer: Self-pay

## 2020-09-27 ENCOUNTER — Encounter: Payer: Self-pay | Admitting: Internal Medicine

## 2020-09-27 ENCOUNTER — Ambulatory Visit (INDEPENDENT_AMBULATORY_CARE_PROVIDER_SITE_OTHER): Payer: Medicare Other | Admitting: Internal Medicine

## 2020-09-27 VITALS — BP 140/90 | HR 56 | Temp 97.4°F | Ht 63.0 in | Wt 150.6 lb

## 2020-09-27 DIAGNOSIS — E785 Hyperlipidemia, unspecified: Secondary | ICD-10-CM | POA: Diagnosis not present

## 2020-09-27 DIAGNOSIS — N185 Chronic kidney disease, stage 5: Secondary | ICD-10-CM

## 2020-09-27 DIAGNOSIS — I10 Essential (primary) hypertension: Secondary | ICD-10-CM

## 2020-09-27 DIAGNOSIS — Z1211 Encounter for screening for malignant neoplasm of colon: Secondary | ICD-10-CM

## 2020-09-27 DIAGNOSIS — Z9114 Patient's other noncompliance with medication regimen: Secondary | ICD-10-CM

## 2020-09-27 DIAGNOSIS — Q613 Polycystic kidney, unspecified: Secondary | ICD-10-CM

## 2020-09-27 DIAGNOSIS — Z91148 Patient's other noncompliance with medication regimen for other reason: Secondary | ICD-10-CM

## 2020-09-27 DIAGNOSIS — R413 Other amnesia: Secondary | ICD-10-CM

## 2020-09-27 DIAGNOSIS — R410 Disorientation, unspecified: Secondary | ICD-10-CM | POA: Diagnosis not present

## 2020-09-27 DIAGNOSIS — K635 Polyp of colon: Secondary | ICD-10-CM | POA: Diagnosis not present

## 2020-09-27 MED ORDER — CARVEDILOL 6.25 MG PO TABS: 6.2500 mg | ORAL_TABLET | Freq: Two times a day (BID) | ORAL | 3 refills | Status: DC

## 2020-09-27 MED ORDER — FUROSEMIDE 40 MG PO TABS: 40.0000 mg | ORAL_TABLET | Freq: Every day | ORAL | 3 refills | Status: DC | PRN

## 2020-09-27 NOTE — Progress Notes (Addendum)
Chief Complaint  Patient presents with  . Follow-up  . Shaking    Hands   . Memory issues    F/u  1. 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 If compliance with meds she is not taking hydralazine 25 mg bid prn not taking lasix 40 mg qd  Labs [facility]y 07/2020 phos elevated 5.6 GFR 9 BUN 87, Tsh 1.203  She is not drinking sodas and denies sob, has some ankle swelling but not severe  3. hld not taking zetia disc praluent and repatha labs 07/2020 [name] neurology tc >331, LDL 235  Pt not interested in seeing cardiology currently  4. H/o colon polyps will refer to Gi to f/u overdue   Review of Systems  Constitutional: Negative for weight loss.  HENT: Negative for hearing loss.   Eyes: Negative for blurred vision.  Respiratory: Negative for shortness of breath.   Cardiovascular: Negative for chest pain.  Gastrointestinal: Negative for abdominal pain.  Genitourinary:       +still making urine  Skin: Negative for rash.       +varicose veins  Psychiatric/Behavioral: Positive for memory loss. Negative for depression.   Past Medical History:  Diagnosis Date  . Abnormal Pap smear of cervix    mid 40s to 50s   . Allergy   . Anemia   . Brittle nails   . Chicken pox   . Chronic kidney disease    CKD 4/borderline 5   . 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 Emphysema of lung (HCC)    chronic bronchitis   . Fibrocystic breast changes   . Glaucoma    [name] [name]  . Heart murmur   .  Hyperlipidemia   . Hypertension   . Measles    childhood  . Secondary hyperparathyroidism ([place])   . Shoulder pain   . Sleep apnea   . Thyroid disease    hypothyroidism    Past Surgical History:  Procedure Laterality Date  . ABDOMINAL HYSTERECTOMY     total 2/2 fibroids in [place] CYST REMOVAL  12/2012   Performed at [facility]  . UTERINE FIBROID SURGERY     In [place] several years ago  . VARICOSE VEIN SURGERY     left with h/o abnormal abi   . VEIN LIGATION AND STRIPPING Left    Performed in [place]   Family History  Problem Relation Age of Onset  . Hypertension Mother   . Diabetes Mother   . Arthritis Mother   . Hypertension Father   . Stroke Father        complications from HTN  . Prostate cancer Father   . Alcohol abuse Brother   . COPD Brother   . Depression Brother   . Drug abuse Brother   . Early death Brother   . Mental illness Daughter   . Diabetes  Son   . Alcohol abuse Brother   . Drug abuse Brother   . Early death Brother   . Cystic kidney disease Other   . Kidney failure Cousin   . Sickle cell anemia Other   . Stomach cancer Neg Hx   . Pancreatic cancer Neg Hx   . Esophageal cancer Neg Hx    Social History   Socioeconomic History  . Marital status: Widowed    Spouse name: Not on file  . Number of children: Not on file  . Years of education: Not on file  . Highest education level: Not on file  Occupational History  . Not on file  Tobacco Use  . Smoking status: Never Smoker  . Smokeless tobacco: Never Used  Vaping Use  . Vaping Use: Never used  Substance and Sexual Activity  . Alcohol use: No    Alcohol/week: 0.0 standard drinks  . Drug use: No  . Sexual activity: Not Currently  Other Topics Concern  . 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 carvedilol (COREG) 6.25 MG tablet Take 1 tablet (6.25 mg total) by mouth 2 (two) times daily with a meal.  . cholecalciferol (VITAMIN D3) 25 MCG (1000 UNIT) tablet Take 1,000 Units by mouth daily.  . dorzolamide-timolol (COSOPT) 22.3-6.8 MG/ML ophthalmic solution Place 1 drop into both eyes every morning.  . Latanoprostene Bunod (VYZULTA) 0.024 % SOLN Apply 1 drop to eye at bedtime. Both eyes per [name] (Patient taking differently: Place 1 drop into both eyes at bedtime. Both eyes per [name])  . lisinopril (ZESTRIL) 40 MG tablet Take 40 mg by mouth daily.  . Multiple Vitamin (MULTIVITAMIN WITH MINERALS) TABS tablet Take 1 tablet by mouth daily with lunch.   Allergies  Allergen Reactions  . Citrus Itching and Swelling   No results found for this or any previous visit (from the past 2160 hour(s)). Objective  Body mass index is 26.68 kg/m. Wt Readings from Last 3 Encounters:  09/27/20 150 lb 9.6 oz (68.3 kg)  02/24/20 148 lb 3.2 oz (67.2 kg)  09/26/19 155 lb 8 oz (70.5 kg)   Temp Readings from Last 3 Encounters:  09/27/20 (!) 97.4 F (36.3 C) (Oral)  02/24/20 (!) 96.5 F (35.8 C) (Tympanic)  09/26/19 (!) 97.4 F (36.3 C)   BP Readings from Last 3 Encounters:  09/27/20 140/90  02/24/20 (!) 168/76  09/26/19 (!) 151/80   Pulse Readings from Last 3 Encounters:  09/27/20 (!) 56  02/24/20 (!) 54  09/26/19 65    Physical Exam Vitals and nursing note reviewed.  Constitutional:      Appearance: Normal appearance. She is well-developed and well-groomed.  HENT:     Head: Normocephalic and atraumatic.  Eyes:     Conjunctiva/sclera:  Conjunctivae normal.     Pupils: Pupils  are equal, round, and reactive to light.  Cardiovascular:     Rate and Rhythm: Normal rate and regular rhythm.     Heart sounds: Normal heart sounds. No murmur heard.   Pulmonary:     Effort: Pulmonary effort is normal.     Breath sounds: Normal breath sounds.  Skin:    General: Skin is warm and moist.  Neurological:     General: No focal deficit present.     Mental Status: She is alert and oriented to person, place, and time. Mental status is at baseline.     Gait: Gait normal.  Psychiatric:        Attention and Perception: Attention and perception normal.        Mood and Affect: Mood and affect normal.        Speech: Speech normal.        Behavior: Behavior normal. Behavior is cooperative.        Thought Content: Thought content normal.        Cognition and Memory: Cognition and memory normal.        Judgment: Judgment normal.     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 Sabra Heck CKD 5 pt wants to do conservative care as long as possible BP 160/87 on BB and lasix40 mg qd -as of 09/27/20 she is not taking lasix nor hydralazine 25 mg bid  Memory loss r/o UTI- Confusion - Plan: Urinalysis, Routine w reflex microscopic, Urine Culture will bring urine back given kit today We reviewed today confusion  could be due to ckd 5 with gfr 9 07/2020 labs [facility] neurology and she needs to f/u with renal she wants to think about it  Did not start exelon 1.5 bid capsules or see therapy as [facility] rec. 07/2020  Hyperlipidemia, unspecified hyperlipidemia type Not taking zetia  Disc rec repatha/praluent  Declines cardiology for now rec to disc PSK 9I meds for hld Noncompliance with medications   HM Declines flu shot MMR immune rec hep b vaccine not immunedisc'edprevpt wants to think about it Tdap had 10/19/13 Dr. Kellie Shropshire Will discshingrixin future covid 19 had 2/2 pfizer disc consider 3rd vaccine and will need 4th  Prevnar had 09/14/17  pna 23 10/19/13  Declines further mammograms though last 11/27/15 [facility] with b/l asymmetries -as of 09/27/20 declines   [place] in [place] -will disc in future prev referred did not schedule  Pap out of age window  Colonoscopy 8/4/14colonpolyps will disc with pt If wants repeat in 12/2017  -referred Dr. Servando Snare 09/27/20 I hope she goes to this prev referred years ago and did not go  Never smoker Pt reports [facility] not Dr. Harlon Flor yet   Provider: Dr. French Ana [name]-Internal Medicine

## 2020-09-27 NOTE — Patient Instructions (Addendum)
- Strongly recommend walking 20-30 minutes or cycling on stationary bike 20-30 minutes per neurology  Renal vitamin  Consider kidney specialist in [place] or [facility] [place] *  Please take your medications as prescribed  Consider cholesterol medication injection repatha or praluent  Consider 3rd pfizer at pharmacy and continue please to wear  Your mask   Consider colonoscopy and mammogram call if wanted the facility   Constipation miralax and or colace as needed   ? Foods that are high in phosphorus include beer, chocolate, milk, cheese, liver, and oysters. Phosphorus is also used as a food additive and in many bottled beverages/soda.  Drink enough fluid to keep your urine pale yellow.  Do not take vitamin D or phosphorus supplements.  Do not take laxatives or use enemas that contain phosphorus or phosphate. Hyperphosphatemia Hyperphosphatemia is the condition of having too much phosphate in your blood. Phosphate forms when a mineral called phosphorus binds with oxygen. Phosphate is important because it binds with calcium to form and support your teeth and bones. However, if too much phosphate builds up in your blood, it binds to calcium in your blood and causes low calcium levels (hypocalcemia). Too much phosphate binding to calcium can also cause stones (calcifications) to build up in your body. This can damage your kidneys, heart, and blood vessels. What are the causes? The most common cause of this condition is kidney failure. A diseased kidney may not filter enough phosphate out of your blood. Other causes of hyperphosphatemia include:  Taking in too much phosphate. This can happen from taking too many laxatives or using enemas that contain phosphate. It can also result from receiving too much phosphate in IV fluids.  Increased absorption of phosphate. This can happen if you take too much vitamin D. Vitamin D causes more phosphate to be absorbed in the digestive  tract.  Conditions that release phosphate from inside the cells of the body (redistribution). This can happen from: ? Severe muscle injury, such as a crushing injury, or any condition that causes muscle cells to break down quickly. ? Cancer cells that die quickly. ? Severe infection. ? Poorly controlled diabetes.  Low parathyroid hormone. This hormone stimulates excretion of phosphate in the kidney. If it gets too low, phosphate can start to build up in the blood. What are the signs or symptoms? In most cases, there are no symptoms of this condition. Symptoms can develop if the condition leads to hypocalcemia or calcifications, or if it occurs along with kidney failure.  Signs of hypocalcemia include: ? Muscle cramps or spasms. ? Numbness and tingling around the mouth. ? Abnormal heart rhythms. ? Seizures.  Signs of calcifications include: ? Hard bumps (nodules) under the skin. The nodules may cause an itchy skin rash. ? Joint pain.  Signs and symptoms of kidney failure include: ? Fatigue. ? Shortness of breath. ? Appetite loss. ? Nausea and vomiting. How is this diagnosed? This condition may be diagnosed based on a blood test to check whether you have a high level of phosphate. Your health care provider may suspect this condition based on your symptoms and medical history, especially if you have kidney disease or another condition that can cause hyperphosphatemia. The condition is sometimes found during a routine blood test. You may also have other tests, including:  Blood tests for calcium, parathyroid hormone, and kidney function.  Urine tests for phosphate.  Imaging studies to look for calcifications in skin, blood vessels, kidneys, or the heart.  A test  to check for physical signs of hypocalcemia. These include increased reflexes and abnormal facial muscle contractions when tapping on the face (Chvostek's sign). How is this treated? Hyperphosphatemia can be reversed if the  underlying condition is found and treated. If there is no kidney failure, the condition may improve without treatment. If there are symptoms of kidney failure, treatment may include:  IV fluids and medicines that increase urine flow (diuretics) to flush out the phosphate.  Filtering phosphate out of the blood with a procedure called dialysis.  Medicines that bind to phosphate to pull it out of the blood.  Limiting phosphorus or phosphate in the diet. Follow these instructions at home:  Take over-the-counter and prescription medicines only as told by your health care provider.  Follow instructions from your health care provider about eating or drinking restrictions.  Check food labels for phosphate or phosphorus.  You may need to work with a dietitian to lower the amount of phosphorus in your diet. ? Foods that are low in phosphorus include fruits, vegetables, fresh or frozen meat, white rice, and white bread.     Contact a health care provider if:  You have signs of hypocalcemia.  You have itchy bumps under your skin.  You have joint pain.  You have signs or symptoms of kidney failure. Get help right away if:  You have a seizure.  You have chest pain.  You have difficulty breathing. Summary  Hyperphosphatemia is the condition of having too much phosphate in your blood.  Kidney failure is the most common cause of this condition.  Most people do not have signs or symptoms of hyperphosphatemia unless it leads to another condition.  Hyperphosphatemia can lead to hypocalcemia and calcifications in the body.  Treatment for hyperphosphatemia may include IV fluids, filtering the blood with dialysis, medicine to bind phosphate, and limiting phosphorus in your diet. This information is not intended to replace advice given to you by your health care provider. Make sure you discuss any questions you have with your health care provider. Document Revised: 10/20/2019 Document  Reviewed: 10/20/2019 Elsevier Patient Education  2021 [place].  High Cholesterol  High cholesterol is a condition in which the blood has high levels of a white, waxy substance similar to fat (cholesterol). The liver makes all the cholesterol that the body needs. The human body needs small amounts of cholesterol to help build cells. A person gets extra or excess cholesterol from the food that he or she eats. The blood carries cholesterol from the liver to the rest of the body. If you have high cholesterol, deposits (plaques) may build up on the walls of your arteries. Arteries are the blood vessels that carry blood away from your heart. These plaques make the arteries narrow and stiff. Cholesterol plaques increase your risk for heart attack and stroke. Work with your health care provider to keep your cholesterol levels in a healthy range. What increases the risk? The following factors may make you more likely to develop this condition:  Eating foods that are high in animal fat (saturated fat) or cholesterol.  Being overweight.  Not getting enough exercise.  A family history of high cholesterol (familial hypercholesterolemia).  Use of tobacco products.  Having diabetes. What are the signs or symptoms? There are no symptoms of this condition. How is this diagnosed? This condition may be diagnosed based on the results of a blood test.  If you are older than 75 years of age, your health care provider may check  your cholesterol levels every 4-6 years.  You may be checked more often if you have high cholesterol or other risk factors for heart disease. The blood test for cholesterol measures:  "Bad" cholesterol, or LDL cholesterol. This is the main type of cholesterol that causes heart disease. The desired level is less than 100 mg/dL.  "Good" cholesterol, or HDL cholesterol. HDL helps protect against heart disease by cleaning the arteries and carrying the LDL to the liver for  processing. The desired level for HDL is 60 mg/dL or higher.  Triglycerides. These are fats that your body can store or burn for energy. The desired level is less than 150 mg/dL.  Total cholesterol. This measures the total amount of cholesterol in your blood and includes LDL, HDL, and triglycerides. The desired level is less than 200 mg/dL. How is this treated? This condition may be treated with:  Diet changes. You may be asked to eat foods that have more fiber and less saturated fats or added sugar.  Lifestyle changes. These may include regular exercise, maintaining a healthy weight, and quitting use of tobacco products.  Medicines. These are given when diet and lifestyle changes have not worked. You may be prescribed a statin medicine to help lower your cholesterol levels. Follow these instructions at home: Eating and drinking  Eat a healthy, balanced diet. This diet includes: ? Daily servings of a variety of fresh, frozen, or canned fruits and vegetables. ? Daily servings of whole grain foods that are rich in fiber. ? Foods that are low in saturated fats and trans fats. These include poultry and fish without skin, lean cuts of meat, and low-fat dairy products. ? A variety of fish, especially oily fish that contain omega-3 fatty acids. Aim to eat fish at least 2 times a week.  Avoid foods and drinks that have added sugar.  Use healthy cooking methods, such as roasting, grilling, broiling, baking, poaching, steaming, and stir-frying. Do not fry your food except for stir-frying.   Lifestyle  Get regular exercise. Aim to exercise for a total of 150 minutes a week. Increase your activity level by doing activities such as gardening, walking, and taking the stairs.  Do not use any products that contain nicotine or tobacco, such as cigarettes, e-cigarettes, and chewing tobacco. If you need help quitting, ask your health care provider.   General instructions  Take over-the-counter and  prescription medicines only as told by your health care provider.  Keep all follow-up visits as told by your health care provider. This is important. Where to find more information  American Heart Association: www.heart.org  National Heart, Lung, and Blood Institute: https://wilson-eaton.com/ Contact a health care provider if:  You have trouble achieving or maintaining a healthy diet or weight.  You are starting an exercise program.  You are unable to stop smoking. Get help right away if:  You have chest pain.  You have trouble breathing.  You have any symptoms of a stroke. "BE FAST" is an easy way to remember the main warning signs of a stroke: ? B - Balance. Signs are dizziness, sudden trouble walking, or loss of balance. ? E - Eyes. Signs are trouble seeing or a sudden change in vision. ? F - Face. Signs are sudden weakness or numbness of the face, or the face or eyelid drooping on one side. ? A - Arms. Signs are weakness or numbness in an arm. This happens suddenly and usually on one side of the body. ? S - Speech.  Signs are sudden trouble speaking, slurred speech, or trouble understanding what people say. ? T - Time. Time to call emergency services. Write down what time symptoms started.  You have other signs of a stroke, such as: ? A sudden, severe headache with no known cause. ? Nausea or vomiting. ? Seizure. These symptoms may represent a serious problem that is an emergency. Do not wait to see if the symptoms will go away. Get medical help right away. Call your local emergency services (911 in the [place] Do not drive yourself to the hospital. Summary  Cholesterol plaques increase your risk for heart attack and stroke. Work with your health care provider to keep your cholesterol levels in a healthy range.  Eat a healthy, balanced diet, get regular exercise, and maintain a healthy weight.  Do not use any products that contain nicotine or tobacco, such as cigarettes,  e-cigarettes, and chewing tobacco.  Get help right away if you have any symptoms of a stroke. This information is not intended to replace advice given to you by your health care provider. Make sure you discuss any questions you have with your health care provider. Document Revised: 04/18/2019 Document Reviewed: 04/18/2019 Elsevier Patient Education  2021 [place].  Cholesterol Content in Foods Cholesterol is a waxy, fat-like substance that helps to carry fat in the blood. The body needs cholesterol in small amounts, but too much cholesterol can cause damage to the arteries and heart. Most people should eat less than 200 milligrams (mg) of cholesterol a day. Foods with cholesterol Cholesterol is found in animal-based foods, such as meat, seafood, and dairy. Generally, low-fat dairy and lean meats have less cholesterol than full-fat dairy and fatty meats. The milligrams of cholesterol per serving (mg per serving) of common cholesterol-containing foods are listed below. Meat and other proteins  Egg -- one large whole egg has 186 mg.  Veal shank -- 4 oz has 141 mg.  Lean ground [place] (93% lean) -- 4 oz has 118 mg.  Fat-trimmed lamb loin -- 4 oz has 106 mg.  Lean ground beef (90% lean) -- 4 oz has 100 mg.  Lobster -- 3.5 oz has 90 mg.  Pork loin chops -- 4 oz has 86 mg.  Canned salmon -- 3.5 oz has 83 mg.  Fat-trimmed beef top loin -- 4 oz has 78 mg.  Frankfurter -- 1 frank (3.5 oz) has 77 mg.  Crab -- 3.5 oz has 71 mg.  Roasted chicken without skin, white meat -- 4 oz has 66 mg.  Light bologna -- 2 oz has 45 mg.  Deli-cut [place] -- 2 oz has 31 mg.  Canned tuna -- 3.5 oz has 31 mg.  Berniece Salines -- 1 oz has 29 mg.  Oysters and mussels (raw) -- 3.5 oz has 25 mg.  Mackerel -- 1 oz has 22 mg.  Trout -- 1 oz has 20 mg.  Pork sausage -- 1 link (1 oz) has 17 mg.  Salmon -- 1 oz has 16 mg.  Tilapia -- 1 oz has 14 mg. Dairy  Soft-serve ice cream --  cup (4 oz) has 103  mg.  Whole-milk yogurt -- 1 cup (8 oz) has 29 mg.  Cheddar cheese -- 1 oz has 28 mg.  American cheese -- 1 oz has 28 mg.  Whole milk -- 1 cup (8 oz) has 23 mg.  2% milk -- 1 cup (8 oz) has 18 mg.  Cream cheese -- 1 tablespoon (Tbsp) has 15 mg.  [employer]  cheese --  cup (4 oz) has 14 mg.  Low-fat (1%) milk -- 1 cup (8 oz) has 10 mg.  Sour cream -- 1 Tbsp has 8.5 mg.  Low-fat yogurt -- 1 cup (8 oz) has 8 mg.  Nonfat Greek yogurt -- 1 cup (8 oz) has 7 mg.  Half-and-half cream -- 1 Tbsp has 5 mg. Fats and oils  Cod liver oil -- 1 tablespoon (Tbsp) has 82 mg.  Butter -- 1 Tbsp has 15 mg.  Lard -- 1 Tbsp has 14 mg.  Bacon grease -- 1 Tbsp has 14 mg.  Mayonnaise -- 1 Tbsp has 5-10 mg.  Margarine -- 1 Tbsp has 3-10 mg. Exact amounts of cholesterol in these foods may vary depending on specific ingredients and brands.   Foods without cholesterol Most plant-based foods do not have cholesterol unless you combine them with a food that has cholesterol. Foods without cholesterol include:  Grains and cereals.  Vegetables.  Fruits.  Vegetable oils, such as olive, canola, and sunflower oil.  Legumes, such as peas, beans, and lentils.  Nuts and seeds.  Egg whites.   Summary  The body needs cholesterol in small amounts, but too much cholesterol can cause damage to the arteries and heart.  Most people should eat less than 200 milligrams (mg) of cholesterol a day. This information is not intended to replace advice given to you by your health care provider. Make sure you discuss any questions you have with your health care provider. Document Revised: 10/10/2019 Document Reviewed: 10/10/2019 Elsevier Patient Education  2021 [place].  Chronic Kidney Disease, Adult Chronic kidney disease (CKD) occurs when the kidneys are slowly and permanently damaged over a long period of time. The kidneys are a pair of organs that do many important jobs in the body,  including:  Removing waste and extra fluid from the blood to make urine.  Making hormones that maintain the amount of fluid in tissues and blood vessels.  Maintaining the right amount of fluids and chemicals in the body. A small amount of kidney damage may not cause problems, but a large amount of damage may make it hard or impossible for the kidneys to work right. Steps must be taken to slow kidney damage or to stop it from getting worse. If steps are not taken, the kidneys may stop working permanently (end-stage renal disease, or ESRD). Most of the time, CKD does not go away, but it can often be controlled. People who have CKD are usually able to live full lives. What are the causes? The most common causes of this condition are diabetes and high blood pressure (hypertension). Other causes include:  Cardiovascular diseases. These affect the heart and blood vessels.  Kidney diseases. These include: ? Glomerulonephritis, or inflammation of the tiny filters in the kidneys. ? Interstitial nephritis. This is swelling of the small tubes of the kidneys and of the surrounding structures. ? Polycystic kidney disease, in which clusters of fluid-filled sacs form within the kidneys. ? Renal vascular disease. This includes disorders that affect the arteries and veins of the kidneys.  Diseases that affect the body's defense system (immune system).  A problem with urine flow. This may be caused by: ? Kidney stones. ? Cancer. ? An enlarged prostate, in males.  A kidney infection or urinary tract infection (UTI) that keeps coming back.  Vasculitis. This is swelling or inflammation of the blood vessels. What increases the risk? Your chances of having kidney disease increase with age. The following  factors may make you more likely to develop this condition:  A family history of kidney disease or kidney failure. Kidney failure means the kidneys can no longer work right.  Certain genetic  diseases.  Taking medicines often that are damaging to the kidneys.  Being around or being in contact with toxic substances.  Obesity.  A history of tobacco use. What are the signs or symptoms? Symptoms of this condition include:  Feeling very tired (lethargic) and having less energy.  Swelling, or edema, of the face, legs, ankles, or feet.  Nausea or vomiting, or loss of appetite.  Confusion or trouble concentrating.  Muscle twitches and cramps, especially in the legs.  Dry, itchy skin.  A metallic taste in the mouth.  Producing less urine, or producing more urine (especially at night).  Shortness of breath.  Trouble sleeping. CKD may also result in not having enough red blood cells or hemoglobin in the blood (anemia) or having weak bones (bone disease). Symptoms develop slowly and may not be obvious until the kidney damage becomes severe. It is possible to have kidney disease for years without having symptoms. How is this diagnosed? This condition may be diagnosed based on:  Blood tests.  Urine tests.  Imaging tests, such as an ultrasound or a CT scan.  A kidney biopsy. This involves removing a sample of kidney tissue to be looked at under a microscope. Results from these tests will help to determine how serious the CKD is. How is this treated? There is no cure for most cases of this condition, but treatment usually relieves symptoms and prevents or slows the worsening of the disease. Treatment may include:  Diet changes, which may require you to avoid alcohol and foods that are high in salt, potassium, phosphorous, and protein.  Medicines. These may: ? Lower blood pressure. ? Control blood sugar (glucose). ? Relieve anemia. ? Relieve swelling. ? Protect your bones. ? Improve the balance of salts and minerals in your blood (electrolytes).  Dialysis, which is a type of treatment that removes toxic waste from the body. It may be needed if you have kidney  failure.  Managing any other conditions that are causing your CKD or making it worse. Follow these instructions at home: Medicines  Take over-the-counter and prescription medicines only as told by your health care provider. The amount of some medicines that you take may need to be changed.  Do not take any new medicines unless approved by your health care provider. Many medicines can make kidney damage worse.  Do not take any vitamin and mineral supplements unless approved by your health care provider. Many nutritional supplements can make kidney damage worse. Lifestyle  Do not use any products that contain nicotine or tobacco, such as cigarettes, e-cigarettes, and chewing tobacco. If you need help quitting, ask your health care provider.  If you drink alcohol: ? Limit how much you use to:  0-1 drink a day for women who are not pregnant.  0-2 drinks a day for men. ? Know how much alcohol is in your drink. In the [place] one drink equals one 12 oz bottle of beer (355 mL), one 5 oz glass of wine (148 mL), or one 1 oz glass of hard liquor (44 mL).  Maintain a healthy weight. If you need help, ask your health care provider.   General instructions  Follow instructions from your health care provider about eating or drinking restrictions, including any prescribed diet.  Track your blood pressure at home.  Report changes in your blood pressure as told.  If you are being treated for diabetes, track your blood glucose levels as told.  Start or continue an exercise plan. Exercise at least 30 minutes a day, 5 days a week.  Keep your immunizations up to date as told.  Keep all follow-up visits. This is important.   Where to find more information  American Association of Kidney Patients: BombTimer.gl  National Kidney Foundation: www.kidney.[place]: https://mathis.com/  Life Options: www.lifeoptions.org  Kidney [employer]: www.kidneyschool.org Contact a health care provider  if:  Your symptoms get worse.  You develop new symptoms. Get help right away if:  You develop symptoms of ESRD. These include: ? Headaches. ? Numbness in your hands or feet. ? Easy bruising. ? Frequent hiccups. ? Chest pain. ? Shortness of breath. ? Lack of menstrual periods, in women.  You have a fever.  You are producing less urine than usual.  You have pain or bleeding when you urinate or when you have a bowel movement. These symptoms may represent a serious problem that is an emergency. Do not wait to see if the symptoms will go away. Get medical help right away. Call your local emergency services (911 in the [place] Do not drive yourself to the hospital. Summary  Chronic kidney disease (CKD) occurs when the kidneys become damaged slowly over a long period of time.  The most common causes of this condition are diabetes and high blood pressure (hypertension).  There is no cure for most cases of CKD, but treatment usually relieves symptoms and prevents or slows the worsening of the disease. Treatment may include a combination of lifestyle changes, medicines, and dialysis. This information is not intended to replace advice given to you by your health care provider. Make sure you discuss any questions you have with your health care provider. Document Revised: 08/24/2019 Document Reviewed: 08/24/2019 Elsevier Patient Education  [place].

## 2020-09-27 NOTE — Addendum Note (Signed)
Addended by: Orland Mustard on: 09/27/2020 06:21 PM   Modules accepted: Orders

## 2020-10-09 ENCOUNTER — Encounter: Payer: Self-pay | Admitting: *Deleted

## 2020-11-06 ENCOUNTER — Telehealth: Payer: Self-pay | Admitting: Internal Medicine

## 2020-11-06 NOTE — Telephone Encounter (Signed)
I rec patient has a consult with a kidney specialist due to chronic kidney disease stage 5 is she agreeable we discussed last visit

## 2020-11-07 NOTE — Telephone Encounter (Signed)
Patient agreeable to referral to [facility] in [place]

## 2020-11-08 NOTE — Telephone Encounter (Signed)
Referral sent urgently [facility]

## 2020-11-08 NOTE — Addendum Note (Signed)
Addended by: Orland Mustard on: 11/08/2020 05:18 PM   Modules accepted: Orders

## 2020-11-20 NOTE — Telephone Encounter (Signed)
[facility] called. They wanted Dr [name] to know that they are trying to schedule patient an appointment. Patient is giving them a hard time, patient tells them she has to speak to her doctor and her daughter.

## 2020-11-20 NOTE — Telephone Encounter (Signed)
Called Patient to address any concerns she may have. Patient states that when talking to their office she felt they were not even pronouncing things right and she felt this may be a spam call.  Informed the Patient that I can give her the information of who she was referred to. This way she can call them herself and insure that she is speaking the legitimate place. Gave Patient information that she was referred to Nephrology Clarice Pole, MD. Gave her the practice name of [facility] in [place], the number, and the address. Patient wrote this down and repeated this to me to verify. It was correct.   Patient wanted to know why she was not referred to [place]. Informed the Patient that both locations are affiliated with the same practice, [facility] and that [facility] is closer to her. Patient states she had told Dr [name] [name] she did not want to go to a certain office and she had choose somewhere else. Informed the Patient that she was previously seen by [facility] in [place] and had informed Dr [name] [name] during her visit that she no longer wanted to go there. Patient states she does not want to go to [place] she wants to go to [facility].   I informed the Patient that I can change this referral for her and asked if there was anyone in particular she would like to see in [place] as referrals have to have a specific doctor on them. Patient states she would like to see who Dr [name] [name] recommends. Informed the Patient that Dr [name] [name] recommended Clarice Pole, MD in [place]. Asked the Patient if there was a particular reason for not wanting to be seen at [place] and [place] instead? Informed the Patient that I could then send this to Dr [name] [name].  Patient said no and she would like to speak with Dr [name] [name] then asked for her number. Informed the Patient that Dr [name]  [name] is out of office and due to return 11/27/20. Patient asked that Dr [name] [name] give her a call when she comes back in. Informed her that to speak with the doctor directly she will need an appointment. Went to schedule the Patient an appointment for next week and she states to just have Dr [name] [name] call her next week. Informed the Patient that in order to get Dr [name] [name] on the phone I will need to schedule her a set time for the call.   Patient then asked who I was again. Informed her that I am Dr [name] [name]'s CMA, that I gather information for her and Dr [name] [name] then responds to the messages in between seeing her [name] that are in office. That this is why I would be calling her instead of Dr [name] [name]. Patient states she will call back in next week to schedule an appointment. I informed her that our slots fill quickly and I can not guarantee that There will be appointments left. Patient verbalized understanding and states that I can at least take a message then and send it to Dr [name] [name] for her. I asked the Patient if she would be willing to tell me now so that I may send it to Dr [name] [name]'s box for when she comes back in? Patient declined and states she would not be able to give me a message at this time. Patient states she will call back in  For your information

## 2020-11-27 NOTE — Telephone Encounter (Signed)
Pt wanted to go to [facility] in [place] this is where I referred her  This is where she and I decided for her to go  Please refer here pt needs to comply has chronic kidney disease stage 5

## 2020-11-27 NOTE — Telephone Encounter (Signed)
[facility] neph[facility] called back to let us know pt is refusing to schedule appt with them until she speaks with her PCP and they will no longer try and call the pt to schedule after last attempt calling. Both [facility] have tried to contact and schedule pt numerous times.

## 2020-11-28 NOTE — Telephone Encounter (Signed)
Does Patient need a new referral that specifies [place]?

## 2020-11-30 NOTE — Telephone Encounter (Signed)
Noted  

## 2020-12-27 ENCOUNTER — Telehealth: Payer: Self-pay | Admitting: Internal Medicine

## 2020-12-27 ENCOUNTER — Ambulatory Visit: Payer: Medicare Other | Admitting: Internal Medicine

## 2020-12-27 NOTE — Telephone Encounter (Signed)
Patient no-showed today's appointment; appointment was for 12/27/20, provider notified for review of record. Letter sent for patient to call in and re-schedule.

## 2021-03-12 DIAGNOSIS — H401123 Primary open-angle glaucoma, left eye, severe stage: Secondary | ICD-10-CM | POA: Diagnosis not present

## 2021-03-12 DIAGNOSIS — H401113 Primary open-angle glaucoma, right eye, severe stage: Secondary | ICD-10-CM | POA: Diagnosis not present

## 2021-03-12 DIAGNOSIS — H524 Presbyopia: Secondary | ICD-10-CM | POA: Diagnosis not present

## 2021-03-12 DIAGNOSIS — H25813 Combined forms of age-related cataract, bilateral: Secondary | ICD-10-CM | POA: Diagnosis not present

## 2021-03-20 ENCOUNTER — Ambulatory Visit: Payer: Medicare Other | Admitting: Internal Medicine

## 2021-04-09 DIAGNOSIS — H34832 Tributary (branch) retinal vein occlusion, left eye, with macular edema: Secondary | ICD-10-CM | POA: Diagnosis not present

## 2021-04-09 DIAGNOSIS — H401133 Primary open-angle glaucoma, bilateral, severe stage: Secondary | ICD-10-CM | POA: Diagnosis not present

## 2021-04-09 DIAGNOSIS — H2513 Age-related nuclear cataract, bilateral: Secondary | ICD-10-CM | POA: Diagnosis not present

## 2021-04-22 DIAGNOSIS — U071 COVID-19: Secondary | ICD-10-CM | POA: Diagnosis not present

## 2021-04-22 DIAGNOSIS — R509 Fever, unspecified: Secondary | ICD-10-CM | POA: Diagnosis not present

## 2021-05-08 DIAGNOSIS — H401133 Primary open-angle glaucoma, bilateral, severe stage: Secondary | ICD-10-CM | POA: Diagnosis not present

## 2021-05-08 DIAGNOSIS — H2513 Age-related nuclear cataract, bilateral: Secondary | ICD-10-CM | POA: Diagnosis not present

## 2021-05-08 DIAGNOSIS — H34832 Tributary (branch) retinal vein occlusion, left eye, with macular edema: Secondary | ICD-10-CM | POA: Diagnosis not present

## 2021-05-09 ENCOUNTER — Ambulatory Visit (INDEPENDENT_AMBULATORY_CARE_PROVIDER_SITE_OTHER): Payer: Medicare Other | Admitting: Internal Medicine

## 2021-05-09 ENCOUNTER — Encounter: Payer: Self-pay | Admitting: Internal Medicine

## 2021-05-09 ENCOUNTER — Other Ambulatory Visit: Payer: Self-pay

## 2021-05-09 VITALS — BP 150/90 | HR 67 | Temp 97.6°F | Ht 63.0 in | Wt 148.2 lb

## 2021-05-09 DIAGNOSIS — N185 Chronic kidney disease, stage 5: Secondary | ICD-10-CM

## 2021-05-09 DIAGNOSIS — Q613 Polycystic kidney, unspecified: Secondary | ICD-10-CM

## 2021-05-09 DIAGNOSIS — Z9114 Patient's other noncompliance with medication regimen: Secondary | ICD-10-CM

## 2021-05-09 DIAGNOSIS — E559 Vitamin D deficiency, unspecified: Secondary | ICD-10-CM

## 2021-05-09 DIAGNOSIS — R739 Hyperglycemia, unspecified: Secondary | ICD-10-CM | POA: Diagnosis not present

## 2021-05-09 DIAGNOSIS — Z01818 Encounter for other preprocedural examination: Secondary | ICD-10-CM | POA: Diagnosis not present

## 2021-05-09 DIAGNOSIS — Z1231 Encounter for screening mammogram for malignant neoplasm of breast: Secondary | ICD-10-CM | POA: Diagnosis not present

## 2021-05-09 DIAGNOSIS — I10 Essential (primary) hypertension: Secondary | ICD-10-CM

## 2021-05-09 DIAGNOSIS — E039 Hypothyroidism, unspecified: Secondary | ICD-10-CM

## 2021-05-09 DIAGNOSIS — R7303 Prediabetes: Secondary | ICD-10-CM | POA: Diagnosis not present

## 2021-05-09 DIAGNOSIS — I120 Hypertensive chronic kidney disease with stage 5 chronic kidney disease or end stage renal disease: Secondary | ICD-10-CM | POA: Diagnosis not present

## 2021-05-09 DIAGNOSIS — E785 Hyperlipidemia, unspecified: Secondary | ICD-10-CM

## 2021-05-09 DIAGNOSIS — E2839 Other primary ovarian failure: Secondary | ICD-10-CM

## 2021-05-09 MED ORDER — CARVEDILOL 6.25 MG PO TABS: 6.2500 mg | ORAL_TABLET | Freq: Two times a day (BID) | ORAL | 3 refills | Status: DC

## 2021-05-09 NOTE — Progress Notes (Signed)
Chief Complaint  Patient presents with   Follow-up   Immunizations    Discuss getting flu shot for the first time.    F/u  1. Ckd 5 lost to f/u renal encouraged to f/u asap [facility] [place] no fatigue, appetite loss, n/v, still makes urine variable at times, no chest pain, sob, leg swelling she is not taking coreg 6.25 mg bid, lasix 40 mg, lis 40 and has been noncomplianct for years  2. Hld declines statin will refer to cards for above and will need pre op clearance in case needs to start dialysis and for colonoscopy possible in the future    Review of Systems  Constitutional:  Negative for malaise/fatigue and weight loss.  HENT:  Negative for hearing loss.   Eyes:  Negative for blurred vision.  Respiratory:  Negative for shortness of breath.   Cardiovascular:  Negative for chest pain.  Gastrointestinal:  Negative for abdominal pain and blood in stool.  Genitourinary:  Negative for dysuria.  Musculoskeletal:  Negative for falls and joint pain.  Skin:  Negative for rash.  Neurological:  Negative for headaches.  Psychiatric/Behavioral:  Negative for depression.   Past Medical History:  Diagnosis Date   Abnormal Pap smear of cervix    mid 40s to 50s    Allergy    Anemia    Brittle nails    Chicken pox    Chronic kidney disease    CKD 4/borderline 5    CKD (chronic kidney disease)    4/5 due to PKD   COVID-19    late 04/2021 s/p MAB   Emphysema of lung ([place])    chronic bronchitis    Fibrocystic breast changes    Glaucoma    [name] [name]   Heart murmur    Hyperlipidemia    Hypertension    Measles    childhood   Secondary hyperparathyroidism ([place])    Shoulder pain    Sleep apnea    Thyroid disease    hypothyroidism    Past Surgical History:  Procedure Laterality Date   ABDOMINAL HYSTERECTOMY     total 2/2 fibroids in [place]  12/2012   Performed at [facility] several years ago    [place]     left with h/o abnormal abi    [place] Left    Performed in [place] History  Problem Relation Age of Onset   Hypertension Mother    Diabetes Mother    Arthritis Mother    Hypertension Father    Stroke Father        complications from HTN   Prostate cancer Father    Alcohol abuse Brother    COPD Brother    Depression Brother    Drug abuse Brother    Early death Brother    Mental illness Daughter    Diabetes Son    Alcohol abuse Brother    Drug abuse Brother    Early death Brother    Cystic kidney disease Other    Kidney failure Cousin    Sickle cell anemia Other    Stomach cancer Neg Hx    Pancreatic cancer Neg Hx    Esophageal cancer Neg Hx    Social History   Socioeconomic History   Marital status: Widowed    Spouse name: Not on file  Number of children: Not on file   Years of education: Not on file   Highest education level: Not on file  Occupational History   Not on file  Tobacco Use   Smoking status: Never   Smokeless tobacco: Never  Vaping Use   Vaping Use: Never used  Substance and Sexual Activity   Alcohol use: No    Alcohol/week: 0.0 standard drinks   Drug use: No   Sexual activity: Not Currently  Other Topics Concern   Not on file  Social History Narrative   Husband died 05/19/2017 [name]   4 kids (2 sons, 2 daughters)    Never smoker    Lived in [place] before coming to [place] moved 2014/2015    Retired 2011    [employer]       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   Allergies   Allergen Reactions   Citrus Itching and Swelling   No results found for this or any previous visit (from the past 2160 hour(s)). Objective  Body mass index is 26.25 kg/m. Wt Readings from Last 3 Encounters:  05/09/21 148 lb 3.2 oz (67.2 kg)  09/27/20 150 lb 9.6 oz (68.3 kg)  02/24/20 148 lb 3.2 oz (67.2 kg)   Temp Readings from Last 3 Encounters:  05/09/21 97.6 F (36.4 C) (Temporal)  09/27/20 (!) 97.4 F (36.3 C) (Oral)  02/24/20 (!) 96.5 F (35.8 C) (Tympanic)   BP Readings from Last 3 Encounters:  05/09/21 (!) 150/90  09/27/20 140/90  02/24/20 (!) 168/76   Pulse Readings from Last 3 Encounters:  05/09/21 67  09/27/20 (!) 56  02/24/20 (!) 54    Physical Exam Vitals and nursing note reviewed.  Constitutional:      Appearance: Normal appearance. She is well-developed and well-groomed.  HENT:     Head: Normocephalic and atraumatic.  Eyes:     Conjunctiva/sclera: Conjunctivae normal.     Pupils: Pupils are equal, round, and reactive to light.  Cardiovascular:     Rate and Rhythm: Normal rate and regular rhythm.     Heart sounds: Normal heart sounds. No murmur heard. Pulmonary:     Effort: Pulmonary effort is normal.     Breath sounds: Normal breath sounds.  Abdominal:     General: Abdomen is flat. Bowel sounds are normal.     Tenderness: There is no abdominal tenderness.  Musculoskeletal:        General: No tenderness.  Skin:    General: Skin is warm and dry.  Neurological:     General: No focal deficit present.     Mental Status: She is alert and oriented to person, place, and time. Mental status is at baseline.     Cranial Nerves: Cranial nerves 2-12 are intact.     Gait: Gait is intact.  Psychiatric:        Attention and Perception: Attention and perception normal.        Mood and Affect: Mood and affect normal.        Speech: Speech normal.  Behavior: Behavior normal. Behavior is cooperative.        Thought Content: Thought content normal.         Cognition and Memory: Cognition and memory normal.        Judgment: Judgment normal.    Assessment  Plan  Essential hypertension with PKD and med noncompliance- Plan: Ambulatory referral to Cardiology, Ambulatory referral to Nephrology, Comprehensive metabolic panel, Lipid panel, CBC with Differential/Platelet, carvedilol (COREG) 6.25 MG tablet bid  Lis 40 mg qd  Lasix 40 mg qd  Noncompliant with meds  Hypothyroidism, unspecified type - Plan: TSH Was on thyroid med in the past but normal w/o meds  Hyperlipidemia,  - Plan: Ambulatory referral to Cardiology, Ambulatory referral to Nephrology, Lipid panel Declined statin in the past  Prediabetes  CKD (chronic kidney disease) stage 5, GFR less than 15 ml/min (HCC) - Plan: Ambulatory referral to Nephrology [facility], Comprehensive metabolic panel, Iron, TIBC and Ferritin Panel, PTH, intact (no Ca)  Noncompliance with medications  Stage 5 chronic kidney disease not on chronic dialysis ([place]) - Plan: Ambulatory referral to Cardiology, Ambulatory referral to Nephrology, Comprehensive metabolic panel, Iron, TIBC and Ferritin Panel, PTH, intact (no Ca)  Pre-operative clearance - Plan: Ambulatory referral to Cardiology in case needs fistula or pd cath And colonoscopy  [facility] cards Dr. Raliegh Ip  Hyperglycemia - Plan: Hemoglobin A1c  Vitamin D deficiency - Plan: Vitamin D (25 hydroxy)   HM Declines flu shot but will consider  MMR immune rec hep b vaccine not immune disc'ed prev pt wants to think about it  Tdap had 10/19/13 Dr. Heath Gold Will disc shingrix in future covid 19 had 2/2 pfizer disc consider 3rd vaccine and will need 4th had covid 04/2021  Prevnar had 09/14/17  pna 23 10/19/13    Referred mammogram  11/27/15 [facility] with b/l asymmetries -as of 09/27/20 declines   Last DEXA in [place] years ago -will disc in future prev referred did not schedule   Pap out of age window  Colonoscopy 01/03/13 colon polyps will disc with pt If wants repeat in 12/2017   -referred Dr. Allen Norris 09/27/20 I hope she goes to this prev referred years ago and did not go   -05/09/21 consider repeat colonoscopy get cards and renal clearance 1st   Never smoker   [facility] Dr[facility] Venetia Maxon yet    Provider: Dr. [name] [name]-Internal Medicine

## 2021-05-09 NOTE — Patient Instructions (Addendum)
Consider flu shot high dose for above 65  Consider covid pfizer vaccine you still need 2 more doses  -do not do with flu shot wait 2-4 weeks  -wait 90 days before covid vaccine (07/2021 late feb)  After seeing kidney specialists we can consider coordinating colonoscopy but they need to clear you and likely need to cardiology   Dr. Nehemiah Massed cardiology you need an appointment  [phone] Not available [facility]   [place] [address]   Kidney specialist at [facility] in: [facility] Address: [address] [place], [place] [address] Phone: [phone]   End-Stage Kidney Disease End-stage kidney disease occurs when the kidneys are so damaged that they cannot function and cannot get better. This condition may also be referred to as end-stage renal disease or ESRD. The kidneys are two organs that do many important jobs in the body, including: Removing waste and extra fluid from the blood to make urine. Making hormones that maintain the amount of fluid in tissues and blood vessels. Maintaining the right amount of fluids and chemicals in the body. Without functioning kidneys, toxins build up in the blood, and life-threatening complications can occur. What are the causes? This condition usually occurs when a long-term (chronic) kidney disease gets worse and results in permanent damage to the kidneys. It may also be caused by sudden damage to the kidneys (acute kidney injury). What increases the risk? The following factors may make you more likely to develop this condition: Having a family history of chronic kidney disease (CKD). Chronic medical conditions that affect the kidneys, such as: Cardiovascular disease, including high blood pressure. Diabetes. Certain diseases that affect the body's disease-fighting system (immunesystem). Smoking or a history of smoking. Overuse of over-the-counter pain medicines. Being around or being  in contact with poisonous (toxic) substances. What are the signs or symptoms? Symptoms of this condition include: Swelling of the face, legs, ankles, or feet. Numbness, tingling, or loss of feeling in the hands or feet. Tiredness (lethargy). Nausea or vomiting. Confusion, trouble concentrating, or loss of consciousness. Chest pain. Shortness of breath. Passing little or no urine. Muscle twitches and cramps, especially in the legs. Skin changes, such as: Dry, itchy skin. Pale skin due to anemia, including the skin and tissue around the eye (conjunctiva). Abnormally dark or light skin. Loss of appetite. Headaches. Decrease in muscle size (muscle wasting). Easy bruising. Stopping of menstrual periods, in women. Jerky movements (seizures). How is this diagnosed? This condition may be diagnosed based on: A physical exam, including blood pressure measurements. Urine tests. Blood tests. Imaging tests. A test in which a sample of tissue is removed from the kidneys to be examined under a microscope (kidney biopsy). How is this treated? This condition may be treated with: Dialysis, which is a procedure that removes toxic wastes from the body. There are two types of dialysis: Dialysis that is done through your abdomen. This is called peritoneal dialysis and may be done several times a day. Dialysis that is done by a machine. This is called hemodialysis and may be done several times a week. Kidney transplant. This is surgery to receive a new kidney. In addition to having dialysis or a kidney transplant, you may need to take medicines: To control high blood pressure (hypertension). To control high cholesterol. To treat diabetes. To maintain healthy levels of minerals in the blood (electrolytes). You may also be given a specific meal plan to follow that includes requirements or  limits for: Salt (sodium). Protein. Phosphorus. Potassium. Calcium. Follow these instructions at  home: Medicines Take over-the-counter and prescription medicines only as told by your health care provider. Do not take any new medicines, vitamins, or mineral supplements unless approved by your health care provider. Many medicines and supplements can worsen kidney damage. Follow instructions from your health care provider about adjusting the doses of any medicines you take. Lifestyle Do not use any products that contain nicotine or tobacco, such as cigarettes, e-cigarettes, and chewing tobacco. If you need help quitting, ask your health care provider. Achieve and maintain a healthy weight. If you need help with this, ask your health care provider. Start or continue an exercise plan. Exercise at least 30 minutes a day, 5 days a week. Follow your prescribed meal plan. General instructions Stay current with your shots (immunizations) as told by your health care provider. Keep track of your blood pressure. Report changes in your blood pressure as told by your health care provider. If you are being treated for diabetes, monitor and track your blood sugar (blood glucose) levels as told by your health care provider. Keep all follow-up visits. This is important. Where to find more information American Association of Kidney Patients: BombTimer.gl National Kidney Foundation: www.kidney.[place]: https://mathis.com/ Life Options Rehabilitation [facility]: www.lifeoptions.org Kidney School: www.kidneyschool.org Contact a health care provider if: Your symptoms get worse. You develop new symptoms. Get help right away if: You have weakness in an arm or leg on one side of your body. You have difficulty speaking or you are slurring your speech. You have a sudden change in your vision. You have a sudden, severe headache. You have a sudden weight increase. You have difficulty breathing. Your symptoms suddenly get worse. You have chest pain. Summary End-stage kidney disease occurs when the  kidneys are so damaged that they cannot function and cannot get better. Without functioning kidneys, toxins build up in the blood and life-threatening complications can occur. Treatment may include dialysis or a kidney transplant along with medicines and lifestyle changes. This information is not intended to replace advice given to you by your health care provider. Make sure you discuss any questions you have with your health care provider. Document Revised: 09/12/2019 Document Reviewed: 08/19/2019 Elsevier Patient Education  [place] for Chronic Kidney Disease Chronic kidney disease (CKD) occurs when the kidneys are permanently damaged over a long period of time. When your kidneys are not working well, they cannot remove waste, fluids, and other substances from your blood as well as they did before. The substances can build up, which can worsen kidney damage and affect how your body functions. Certain foods lead to a buildup of these substances. By changing your diet, you can help prevent more kidney damage and delay or prevent the need for dialysis. What are tips for following this plan? Reading food labels Check the amount of salt (sodium) in foods. Choose foods that have less than 300 milligrams (mg) per serving. Check the ingredient list for phosphorus or potassium-based additives or preservatives. Check the amount of saturated fat and trans fat. Limit or avoid these fats as told by your dietitian. Shopping Avoid buying foods that are: Processed or prepackaged. Calcium-enriched or that have calcium added to them (are fortified). Do not buy foods that have salt or sodium listed among the first five ingredients. Buy canned vegetables and beans that say "no salt added" or "low sodium" and rinse them before eating. Cooking Soak vegetables, such  as potatoes, before cooking to reduce potassium. To do this: Peel and cut the vegetables into small pieces. Soak the  vegetables in warm water for at least 2 hours. For every 1 cup of vegetables, use 10 cups of water. Drain and rinse the vegetables with warm water. Boil the vegetables for at least 5 minutes. Meal planning Limit the amount of protein you eat from plant and animal sources each day. Do not add salt to food when cooking or before eating. Eat meals and snacks at around the same time each day. General information Talk with your health care provider about whether you should take a vitamin and mineral supplement. Use standard measuring cups and spoons to measure servings of foods. Use a kitchen scale to measure portions of protein foods. If told by your health care provider, avoid drinking too much fluid. Measure and count all liquids, including water, ice, soups, flavored gelatin, and frozen desserts such as ice pops or ice cream. If you have diabetes: If you have diabetes (diabetes mellitus) and CKD, it is important to keep your blood sugar (glucose) in the target range recommended by your health care provider. Follow your diabetes management plan. This may include: Checking your blood glucose regularly. Taking medicines by mouth, taking insulin, or taking both. Exercising for at least 30 minutes on 5 or more days each week, or as told by your health care provider. Tracking how many servings of carbohydrates you eat at each meal. You may be given specific guidelines on how much of certain foods and nutrients you may eat, depending on your stage of kidney disease and whether you have high blood pressure (hypertension). Follow your meal plan as told by your dietitian. What nutrients should I limit? Work with your health care provider and dietitian to develop a meal plan that is right for you. Foods you can eat and foods you should limit or avoid will depend on the stage of your kidney disease and any other health conditions you have. The items listed below are not a complete list. Talk with your  dietitian about what dietary choices are best for you. Potassium Potassium affects how steadily your heart beats. If too much potassium builds up in your blood, the potassium can cause an irregular heartbeat or even a heart attack. You may need to limit or avoid foods that are high in potassium, such as: Milk and soy milk. Fruits, such as bananas, apricots, nectarines, melon, prunes, raisins, kiwi, and oranges. Vegetables, such as potatoes, sweet potatoes, yams, tomatoes, leafy greens, beets, avocado, pumpkin, and winter squash. White and lima beans. Whole-wheat breads and pastas. Beans and nuts. Phosphorus Phosphorus is a mineral found in your bones. A balance between calcium and phosphorus is needed to build and maintain healthy bones. Too much phosphorus pulls calcium from your bones. This can make your bones weak and more likely to break. Too much phosphorus can also make your skin itch. You may need to limit or avoid foods that are high in phosphorus, such as: Milk and dairy products. Dried beans and peas. Tofu, soy milk, and other soy-based meat replacements. Dark-colored sodas. Nuts and peanut butter. Meat, poultry, and fish. Bran cereals and oatmeal. Protein Protein helps you make and keep muscle. It also helps to repair your body's cells and tissues. One of the natural breakdown products of protein is a waste product called urea. When your kidneys are not working properly, they cannot remove wastes, such as urea. Reducing how much protein you eat  can help prevent a buildup of urea in your blood. Depending on your stage of kidney disease, you may need to limit foods that are high in protein. Sources of animal protein include: Meat (all types). Fish and seafood. Poultry. Eggs. Dairy. Other protein foods include: Beans and legumes. Nuts and nut butter. Soy and tofu.  Sodium Sodium helps to maintain a healthy balance of fluids in your body. Too much sodium can increase your  blood pressure and have a negative effect on your heart and lungs. Too much sodium can also cause your body to retain too much fluid, making your kidneys work harder. Most people should have less than 2,300 mg of sodium each day. If you have hypertension, you may need to limit your sodium to 1,500 mg each day. You may need to limit or avoid foods that are high in sodium, such as: Salt seasonings. Soy sauce. Cured and processed meats. Salted crackers and snack foods. Fast food. Canned soups and most canned foods. Pickled foods. Vegetable juice. Boxed mixes or ready-to-eat boxed meals and side dishes. Bottled dressings, sauces, and marinades. Talk with your dietitian about how much potassium, phosphorus, protein, and sodium you may have each day. Summary Chronic kidney disease (CKD) can lead to a buildup of waste and extra substances in the body. Certain foods lead to a buildup of these substances. By changing your diet as told, you can help prevent more kidney damage and delay or prevent the need for dialysis. Food intake changes are different for each person with CKD. Work with a dietitian to set up nutrient goals and a meal plan that is right for you. If you have diabetes and CKD, it is important to keep your blood sugar in the target range recommended by your health care provider. This information is not intended to replace advice given to you by your health care provider. Make sure you discuss any questions you have with your health care provider. Document Revised: 09/12/2019 Document Reviewed: 09/12/2019 Elsevier Patient Education  2022 [place].  Chronic Kidney Disease, Adult Chronic kidney disease (CKD) occurs when the kidneys are slowly and permanently damaged over a long period of time. The kidneys are a pair of organs that do many important jobs in the body, including: Removing waste and extra fluid from the blood to make urine. Making hormones that maintain the amount of fluid  in tissues and blood vessels. Maintaining the right amount of fluids and chemicals in the body. A small amount of kidney damage may not cause problems, but a large amount of damage may make it hard or impossible for the kidneys to work right. Steps must be taken to slow kidney damage or to stop it from getting worse. If steps are not taken, the kidneys may stop working permanently (end-stage renal disease, or ESRD). Most of the time, CKD does not go away, but it can often be controlled. People who have CKD are usually able to live full lives. What are the causes? The most common causes of this condition are diabetes and high blood pressure (hypertension). Other causes include: Cardiovascular diseases. These affect the heart and blood vessels. Kidney diseases. These include: Glomerulonephritis, or inflammation of the tiny filters in the kidneys. Interstitial nephritis. This is swelling of the small tubes of the kidneys and of the surrounding structures. Polycystic kidney disease, in which clusters of fluid-filled sacs form within the kidneys. Renal vascular disease. This includes disorders that affect the arteries and veins of the kidneys. Diseases that  affect the body's defense system (immune system). A problem with urine flow. This may be caused by: Kidney stones. Cancer. An enlarged prostate, in males. A kidney infection or urinary tract infection (UTI) that keeps coming back. Vasculitis. This is swelling or inflammation of the blood vessels. What increases the risk? Your chances of having kidney disease increase with age. The following factors may make you more likely to develop this condition: A family history of kidney disease or kidney failure. Kidney failure means the kidneys can no longer work right. Certain genetic diseases. Taking medicines often that are damaging to the kidneys. Being around or being in contact with toxic substances. Obesity. A history of tobacco use. What are  the signs or symptoms? Symptoms of this condition include: Feeling very tired (lethargic) and having less energy. Swelling, or edema, of the face, legs, ankles, or feet. Nausea or vomiting, or loss of appetite. Confusion or trouble concentrating. Muscle twitches and cramps, especially in the legs. Dry, itchy skin. A metallic taste in the mouth. Producing less urine, or producing more urine (especially at night). Shortness of breath. Trouble sleeping. CKD may also result in not having enough red blood cells or hemoglobin in the blood (anemia) or having weak bones (bone disease). Symptoms develop slowly and may not be obvious until the kidney damage becomes severe. It is possible to have kidney disease for years without having symptoms. How is this diagnosed? This condition may be diagnosed based on: Blood tests. Urine tests. Imaging tests, such as an ultrasound or a CT scan. A kidney biopsy. This involves removing a sample of kidney tissue to be looked at under a microscope. Results from these tests will help to determine how serious the CKD is. How is this treated? There is no cure for most cases of this condition, but treatment usually relieves symptoms and prevents or slows the worsening of the disease. Treatment may include: Diet changes, which may require you to avoid alcohol and foods that are high in salt, potassium, phosphorous, and protein. Medicines. These may: Lower blood pressure. Control blood sugar (glucose). Relieve anemia. Relieve swelling. Protect your bones. Improve the balance of salts and minerals in your blood (electrolytes). Dialysis, which is a type of treatment that removes toxic waste from the body. It may be needed if you have kidney failure. Managing any other conditions that are causing your CKD or making it worse. Follow these instructions at home: Medicines Take over-the-counter and prescription medicines only as told by your health care provider. The  amount of some medicines that you take may need to be changed. Do not take any new medicines unless approved by your health care provider. Many medicines can make kidney damage worse. Do not take any vitamin and mineral supplements unless approved by your health care provider. Many nutritional supplements can make kidney damage worse. Lifestyle  Do not use any products that contain nicotine or tobacco, such as cigarettes, e-cigarettes, and chewing tobacco. If you need help quitting, ask your health care provider. If you drink alcohol: Limit how much you use to: 0-1 drink a day for women who are not pregnant. 0-2 drinks a day for men. Know how much alcohol is in your drink. In the [place] one drink equals one 12 oz bottle of beer (355 mL), one 5 oz glass of wine (148 mL), or one 1 oz glass of hard liquor (44 mL). Maintain a healthy weight. If you need help, ask your health care provider. General instructions  Follow instructions  from your health care provider about eating or drinking restrictions, including any prescribed diet. Track your blood pressure at home. Report changes in your blood pressure as told. If you are being treated for diabetes, track your blood glucose levels as told. Start or continue an exercise plan. Exercise at least 30 minutes a day, 5 days a week. Keep your immunizations up to date as told. Keep all follow-up visits. This is important. Where to find more information American Association of Kidney Patients: BombTimer.gl National Kidney Foundation: www.kidney.[place]: https://mathis.com/ Life Options: www.lifeoptions.org Kidney School: www.kidneyschool.org Contact a health care provider if: Your symptoms get worse. You develop new symptoms. Get help right away if: You develop symptoms of ESRD. These include: Headaches. Numbness in your hands or feet. Easy bruising. Frequent hiccups. Chest pain. Shortness of breath. Lack of menstrual periods, in  women. You have a fever. You are producing less urine than usual. You have pain or bleeding when you urinate or when you have a bowel movement. These symptoms may represent a serious problem that is an emergency. Do not wait to see if the symptoms will go away. Get medical help right away. Call your local emergency services (911 in the [place] Do not drive yourself to the hospital. Summary Chronic kidney disease (CKD) occurs when the kidneys become damaged slowly over a long period of time. The most common causes of this condition are diabetes and high blood pressure (hypertension). There is no cure for most cases of CKD, but treatment usually relieves symptoms and prevents or slows the worsening of the disease. Treatment may include a combination of lifestyle changes, medicines, and dialysis. This information is not intended to replace advice given to you by your health care provider. Make sure you discuss any questions you have with your health care provider. Document Revised: 08/24/2019 Document Reviewed: 08/24/2019 Elsevier Patient Education  2022 [place].  Dialysis Dialysis is a procedure that is done when the kidneys have stopped working properly (kidney failure). It may also be done earlier if it may help improve symptoms. During dialysis, wastes, salt, and extra water are removed from the blood, and the levels of certain minerals in the blood are maintained. Dialysis is done in sessions that are continued until the kidneys get better. If the kidneys cannot get better, such as in end-stage kidney disease, dialysis is continued for life or until you receive a new kidney from a donor (kidney transplant). Types of treatments There are two types of dialysis: hemodialysis and peritoneal dialysis. Both types of dialysis have advantages and disadvantages. Talk with your health care provider about which type of dialysis is best for you. Your lifestyle, preferences, and medical condition will be  considered. In some cases, only one type of dialysis can be chosen. Hemodialysis Hemodialysis is when blood is filtered with a machine and a filter called a dialyzer. This treatment is usually done at a hospital or dialysis center three times per week. Visits last about 3-5 hours. Before you start hemodialysis treatment, you will have minor surgery to create a vascular access point. Your vascular access will be where you'll connect to the dialyzer. Home hemodialysis may also be an option for some patients. This means that hemodialysis can be performed at home with the help of a family member or caregiver who has had special training. Peritoneal dialysis Peritoneal dialysis is when the thin lining of the abdomen (peritoneum) and a fluid called dialysate are used to filter the blood. Before you start  peritoneal dialysis, a surgeon places a soft tube, called a catheter, in your belly. You may do peritoneal dialysis at home or at almost any other location. After training, most people can perform both types of peritoneal dialysis on their own. You may need up to five exchanges a day. Each exchange takes about 30-40 minutes. The amount of time the dialysate is in your body between exchanges is called a dwell. The dwell usually lasts 1.5-3 hours and can vary with each person. You may choose to do exchanges at night while you sleep, using a machine called a cycler. What are the advantages? Advantages of hemodialysis It is done less often than peritoneal dialysis. Someone else can do the dialysis for you. If you go to a dialysis center: Your health care provider can recognize any problems you may be having. You can interact with others who are having dialysis. This can provide you with emotional support. Advantages of peritoneal dialysis It is less likely than hemodialysis to cause cramps and low blood pressure. There are fewer eating restrictions than with hemodialysis. You may do exchanges on your own  wherever you are, including when you travel. If you do automated peritoneal dialysis, you can set up your cycler every night. What are the disadvantages Disadvantages of hemodialysis Generally, hemodialysis and peritoneal dialysis are safe treatments. However, problems may occur. Cramps and low blood pressure. It may leave you feeling tired on the days you have the treatment. The need to make weekly appointments and work around the center's schedule, if you go to a dialysis center. The need to take extra care when traveling. If you usually get treatment in a dialysis center, you will need to arrange to visit a dialysis center near your destination. If you are having treatments at home, you will need to take the dialyzer with you when traveling. More eating restrictions than with peritoneal dialysis. Disadvantages of peritoneal dialysis More frequent treatments than with hemodialysis. The need to have a good use (dexterity) of your hands. You must also be able to lift bags. The need to learn how to make your equipment free of germs (sterilization techniques). You will need to use these techniques every day to prevent infection. What happens before the treatment? Hemodialysis Before starting hemodialysis, you will have minor surgery to create a site where blood can be removed from your body and returned to your body (vascular access). There are three types of vascular accesses: Arteriovenous (AV) fistula. This type of access is created when an artery and a vein (usually in the arm) are connected during surgery. The AV fistula also has a large diameter that allows your blood to flow out and back into your body quickly. The goal is to allow high blood flow so that the largest amount of blood can pass through the dialyzer. The arteriovenous fistula usually takes 1-6 months to develop after surgery. It may last longer than the other types of vascular accesses and is less likely to become infected or cause  blood clots. Arteriovenous graft. If problems with your veins prevent you from having an AV fistula, you may need an AV graft instead. This type of access is created when an artery and a vein in the arm are connected during surgery with a tube. An arteriovenous graft can usually be used within 2-3 weeks of surgery. A venous catheter. To create this type of access, a thin tube (catheter) is placed in a large vein in your neck, chest, or groin. A venous catheter  can be used right away. It is usually used as a short-term access when dialysis needs to be started right away. Peritoneal dialysis Before starting peritoneal dialysis, you will have surgery to place a catheter in your abdomen. The catheter will be used to transfer a solution called dialysate to and from your abdomen. What happens during the treatment? Hemodialysis During hemodialysis, blood will leave your body through your vascular access site. Blood will travel through a tube to the dialyzer, where it is filtered. The blood will then return to your body through a different tube. Peritoneal dialysis     At the start of a dialysis session, your abdomen will be filled with dialysate solution. During dialysis, waste, salt, and extra water in the blood will pass through the peritoneum and into the dialysate solution. The dialysate solution will be drained from the body at the end of the dialysis session. The process of filling and draining the dialysate is called an exchange. Exchanges are repeated until you have used up all of the dialysate solution for that day. Follow these instructions at home: Eating and drinking Make changes to your diet as told by your health care provider, such as: Limiting your intake of foods that contain a lot of phosphorus and potassium. Limiting your fluid and salt intake. Add protein to your diet because dialysis removes protein. Work with a diet and nutrition specialist (dietitian) to make a meal plan that can  help improve your dialysis and your health and healthy ways to add calories to your diet because you may not have a good appetite. Take vitamins made for people with kidney failure. Activity Rest as told by your health care provider. You may have less energy. You may need to limit some physical activities when your belly is full of dialysis solution. Return to your normal activities as told by your health care provider. Ask your health care provider what activities are safe for you. General instructions Try to adjust to the dialysis treatment, schedule, and diet. This can take some time. You may need to stop working and may not be able to do some of your normal activities. You may feel anxious or depressed when starting dialysis. Over time, many people feel better overall because of dialysis. You may be able to return to work after making some changes, such as reducing work intensity. Take over-the-counter and prescription medicines only as told by your health care provider. Keep all follow-up visits. This is important. Where to find more information [place]: www.kidney.org American Association of Kidney Patients: BombTimer[employer]gl American Kidney Fund: www.kidneyfund.org Summary During dialysis, wastes, salt, and extra water are removed from the blood, and the levels of certain minerals in the blood are maintained. There are two types of dialysis: hemodialysis and peritoneal dialysis. Hemodialysis is when the blood is filtered with a machine and a filter called a dialyzer. Peritoneal dialysis is when the peritoneum is used as a filter. You may do peritoneal dialysis at home or at almost any other location. Both types of dialysis have advantages and disadvantages. Talk with your health care provider about which type of dialysis is best for you. This information is not intended to replace advice given to you by your health care provider. Make sure you discuss any questions you have  with your health care provider. Document Revised: 12/13/2019 Document Reviewed: 12/13/2019 Elsevier Patient Education  2022 [place].  Peritoneal Dialysis Information Peritoneal dialysis is a procedure that filters your blood. You may have this  procedure if your kidneys are not working well. You can perform peritoneal dialysis yourself, or a machine can do it for you at night when you sleep.  Tell a health care provider about: Any allergies you have. All medicines you are taking, including vitamins, herbs, eye drops, creams, and over-the-counter medicines. Any problems you or family members have had with anesthetic medicines. Any blood disorders you have. Any surgeries you have. Any medical conditions you have. Whether you are pregnant or may be pregnant. What are the risks? Generally, this is a safe procedure. However, problems may occur, including: Infection in the lining of your abdomen (peritoneum). This is the most common problem. Infection in the area where the catheter was inserted. Discomfort in the area where the catheter was inserted. Weakened abdominal muscles. This may lead to a hernia, which can cause problems if left untreated. What happens before the procedure? It is important to safely prepare for treatment and take steps to prevent infection. Your health care provider will teach you how to prepare for a dialysis session. Preparation may involve: Putting on a mask. Closing doors and windows in the room where dialysis will be performed. Making sure to wash your hands before and during treatment. Anyone who touches you or the equipment should also wash his or her hands often. Making sure that tubing and equipment are germ-free (sterile). Checking the bag of fluid (dialysate) you will use during the session, to make sure it is sealed and free of germs (uncontaminated). What happens during the procedure? At the start of a session, your abdomen is filled with dialysate.  The dialysate pulls waste, salt, and extra water through the peritoneum. At the end of the session, the dialysate, plus all the waste it pulled from your blood, is drained from your body. There are two kinds of peritoneal dialysis. You may be treated using: Continuous cycling peritoneal dialysis (CCPD). In this type, a machine called a cycler performs an exchange for you by filling and draining your abdomen while you sleep. Continuous ambulatory peritoneal dialysis (CAPD). In this type, you perform exchanges for yourself up to 5 times a day. Each exchange takes about 30-40 minutes. The amount of time the dialysate stays in your body (the dwell) usually varies from 1.5-3 hours. You may go about your day normally between exchanges. Some people may need to have both CAPD and CCPD. What can I expect after procedure? You may need to have lab work or other tests done to check on how well the dialysis is working. Change the bandage (dressing) around your permanent catheter as directed by your health care provider. Keep the dressing clean and dry. Weigh yourself after the treatment and write down your weight. Follow these instructions at home: Eating and drinking Follow your health care provider's instructions about diet. You should follow a diet plan that includes: Nutritional counseling with a dietitian. Vitamin supplements. High-quality proteins, such as meat, poultry, fish, and eggs. Most people on peritoneal dialysis need to eat a high-protein diet because protein is lost during the dialysis exchange. Preventing constipation Avoid becoming constipated. Constipation prevents dialysate from draining well. To prevent constipation: Eat foods that are high in fiber, such as beans, whole grains, and fresh fruits and vegetables. Limit foods that are high in fat and processed sugars, such as fried or sweet foods. Be active. Go to the restroom when you feel that you need to. Do not hold it in. Take  over-the-counter or prescription medicines, such as laxatives, only if  your health care provider recommends them. General instructions Keep a strict schedule. Dialysis must be done every day. Do not skip a day or an exchange. Make time for each exchange. Always keep the dialysate bags and other supplies in a cool, clean, and dry place. Take over-the-counter and prescription medicines only as told by your health care provider. Weigh yourself every day. Sudden weight gain may be a sign of a problem. Keep all follow-up visits. This is important. Where to find more information You can find more information about peritoneal dialysis treatment from: [facility] of Diabetes and Digestive and Kidney Diseases: [employer] National Kidney Foundation: www.kidney.org Contact a health care provider if: You have a fever or chills. You feel nauseous or you vomit. You have diarrhea. You have any problems with an exchange. Your blood pressure increases. You suddenly gain weight or feel short of breath. The catheter seems loose or feels like it is coming out. The fluid that has drained from your abdomen is pinkish or reddish. Women having their menstrual period do not need to seek medical care if the fluid is only a little pink or a little red. There are white strands in the dialysate that are large enough to get stuck in your tubing or catheter. Get help right away if: The area around the catheter swells or becomes red, tender, or painful. There is pus coming from the catheter area. The fluid that has drained from your abdomen is cloudy. You feel more abdominal pain or discomfort. Summary Peritoneal dialysis is a procedure that filters your blood. You may have this procedure if your kidneys are not working well. CAPD and CCPD are the two kinds of peritoneal dialysis. Your health care provider will help you decide which kind is best for you. The main risks of peritoneal dialysis are infection  and weakened abdominal muscles, which may lead to a hernia. This information is not intended to replace advice given to you by your health care provider. Make sure you discuss any questions you have with your health care provider. Document Revised: 01/05/2020 Document Reviewed: 01/05/2020 Elsevier Patient Education  2022 [employer].

## 2021-05-10 ENCOUNTER — Telehealth: Payer: Self-pay | Admitting: Internal Medicine

## 2021-05-10 LAB — TSH: TSH: 0.5 u[IU]/mL (ref 0.35–5.50)

## 2021-05-10 LAB — IRON,TIBC AND FERRITIN PANEL
%SAT: 28 % (calc) (ref 16–45)
Ferritin: 149 ng/mL (ref 16–288)
Iron: 86 ug/dL (ref 45–160)
TIBC: 311 mcg/dL (calc) (ref 250–450)

## 2021-05-10 LAB — CBC WITH DIFFERENTIAL/PLATELET
Basophils Absolute: 0.1 10*3/uL (ref 0.0–0.1)
Basophils Relative: 2.1 % (ref 0.0–3.0)
Eosinophils Absolute: 0 10*3/uL (ref 0.0–0.7)
Eosinophils Relative: 0.9 % (ref 0.0–5.0)
HCT: 31.9 % — ABNORMAL LOW (ref 36.0–46.0)
Hemoglobin: 10 g/dL — ABNORMAL LOW (ref 12.0–15.0)
Lymphocytes Relative: 27.4 % (ref 12.0–46.0)
Lymphs Abs: 0.9 10*3/uL (ref 0.7–4.0)
MCHC: 31.4 g/dL (ref 30.0–36.0)
MCV: 93.2 fl (ref 78.0–100.0)
Monocytes Absolute: 0.3 10*3/uL (ref 0.1–1.0)
Monocytes Relative: 9.5 % (ref 3.0–12.0)
Neutro Abs: 1.9 10*3/uL (ref 1.4–7.7)
Neutrophils Relative %: 60.1 % (ref 43.0–77.0)
Platelets: 215 10*3/uL (ref 150.0–400.0)
RBC: 3.42 Mil/uL — ABNORMAL LOW (ref 3.87–5.11)
RDW: 14.4 % (ref 11.5–15.5)
WBC: 3.1 10*3/uL — ABNORMAL LOW (ref 4.0–10.5)

## 2021-05-10 LAB — LIPID PANEL
Cholesterol: 272 mg/dL — ABNORMAL HIGH (ref 0–200)
HDL: 56 mg/dL (ref >39.00)
LDL Cholesterol: 190 mg/dL — ABNORMAL HIGH (ref 0–99)
NonHDL: 215.75
Total CHOL/HDL Ratio: 5
Triglycerides: 131 mg/dL (ref 0.0–149.0)
VLDL: 26.2 mg/dL (ref 0.0–40.0)

## 2021-05-10 LAB — COMPREHENSIVE METABOLIC PANEL
ALT: 6 U/L (ref 0–35)
AST: 12 U/L (ref 0–37)
Albumin: 3.6 g/dL (ref 3.5–5.2)
Alkaline Phosphatase: 67 U/L (ref 39–117)
BUN: 67 mg/dL — ABNORMAL HIGH (ref 6–23)
CO2: 21 mEq/L (ref 19–32)
Calcium: 8.4 mg/dL (ref 8.4–10.5)
Chloride: 109 mEq/L (ref 96–112)
Creatinine, Ser: 6.46 mg/dL (ref 0.40–1.20)
GFR: 5.87 mL/min — CL (ref >60.00)
Glucose, Bld: 74 mg/dL (ref 70–99)
Potassium: 5.3 mEq/L — ABNORMAL HIGH (ref 3.5–5.1)
Sodium: 139 mEq/L (ref 135–145)
Total Bilirubin: 0.3 mg/dL (ref 0.2–1.2)
Total Protein: 6.3 g/dL (ref 6.0–8.3)

## 2021-05-10 LAB — PARATHYROID HORMONE, INTACT (NO CA): PTH: 345 pg/mL — ABNORMAL HIGH (ref 16–77)

## 2021-05-10 LAB — HEMOGLOBIN A1C: Hgb A1c MFr Bld: 5.8 % (ref 4.6–6.5)

## 2021-05-10 LAB — VITAMIN D 25 HYDROXY (VIT D DEFICIENCY, FRACTURES): VITD: 34.24 ng/mL (ref 30.00–100.00)

## 2021-05-10 NOTE — Telephone Encounter (Signed)
Thank you and once all labs resulted will forward to you to send as well thank you

## 2021-05-10 NOTE — Telephone Encounter (Signed)
Noted  

## 2021-05-10 NOTE — Telephone Encounter (Signed)
CRITICAL VALUE STICKER  CRITICAL VALUE: Creatinine 6.46, GFR 5.87  RECEIVER (on-site recipient of call): Sharee Pimple  DATE & TIME NOTIFIED: 05/10/2021 11:24am  MESSENGER (representative from lab): Saa  MD NOTIFIED: Dr. [name] [name]  TIME OF NOTIFICATION: 11:25am  RESPONSE:

## 2021-05-10 NOTE — Telephone Encounter (Signed)
Urgently referred to [facility] in [place] please sch this asap  Thanks

## 2021-05-14 ENCOUNTER — Telehealth: Payer: Self-pay | Admitting: Internal Medicine

## 2021-05-14 NOTE — Telephone Encounter (Signed)
Holly Mcgee is booked and referral needs to be urgent  Does she want to go  to [place] in [place] or back to [place]?

## 2021-05-14 NOTE — Telephone Encounter (Signed)
Please advise 

## 2021-05-14 NOTE — Telephone Encounter (Signed)
[name] son Corene Cornea returned office phone call. Message was read, and Son stated that neither one of those locations are are good, [facility] or [place].

## 2021-05-15 NOTE — Addendum Note (Signed)
Addended by: Orland Mustard on: 05/15/2021 09:31 AM   Modules accepted: Orders

## 2021-05-15 NOTE — Telephone Encounter (Signed)
Resent message to [facility] nephrology when is appt going to be if too long will send to [facility] where she has been before  Let me know  This is urgent  Inform pt

## 2021-05-16 ENCOUNTER — Telehealth: Payer: Self-pay | Admitting: Internal Medicine

## 2021-05-16 NOTE — Telephone Encounter (Signed)
Where does she want to go for cardiology [place] in [place] or [place]? [facility] or [facility]?  She keeps agreeing to referrals and then rejecting them  Is she scheduled for kidney referral [facility] yet?

## 2021-05-16 NOTE — Telephone Encounter (Signed)
"  Rejection Reason - Patient Declined - Patient did not want to make appointment at [facility] Cardiology, she prefers another location but did not say where. Please contact your Patient for more information on Cardiology consult request. Thank you" Alfred Levins said on May 15, 2021 11:25 AM  Msg from [facility] cardiovascular disease

## 2021-05-16 NOTE — Telephone Encounter (Signed)
Called to speak to Patient. Patient would not verify her identity so could not discuss information. Patient states she will hang up and call back.

## 2021-05-16 NOTE — Telephone Encounter (Signed)
[name], Nino Glow, MD  You; Marcelene Butte, Rasheedah R 3 hours ago (12:14 PM)   TM Where does she want to go for cardiology [place] in [place] or [place]? [facility] or [facility]?  She keeps agreeing to referrals and then rejecting them  Is she scheduled for kidney referral [facility] yet?         Note    YoungBlood, Rasheedah R routed conversation to [name], Nino Glow, MD 5 hours ago (10:48 AM)   Marcelene Butte, Rasheedah R 5 hours ago (10:48 AM)   RY "Rejection Reason - Patient Declined - Patient did not want to make appointment at [facility] Cardiology, she prefers another location but did not say where. Please contact your Patient for more information on Cardiology consult request. Thank you" Alfred Levins said on May 15, 2021 11:25 AM   Msg from [facility] cardiovascular disease

## 2021-05-16 NOTE — Telephone Encounter (Signed)
Added to 05/14/21 telephone encounter

## 2021-05-17 ENCOUNTER — Encounter: Payer: Self-pay | Admitting: Internal Medicine

## 2021-05-17 DIAGNOSIS — F0390 Unspecified dementia without behavioral disturbance: Secondary | ICD-10-CM | POA: Insufficient documentation

## 2021-05-17 NOTE — Telephone Encounter (Signed)
Can we sch appt soon with pt and family ? To discuss code status  Where do we stand referral [facility]?

## 2021-05-17 NOTE — Telephone Encounter (Signed)
Called and spoke with the Patient son. He states that he is [name] POA and will ensure she gets to all of her referred appointments. Patient;s son informed of all of the below. States he is okay with referral to [facility] or [facility], whomever can see her first.   States Cardiology referral is fine with whomever recommended by Dr [name] [name]. They will need to contact the son at his number [phone] and he will ensure she gets to her appointments. Number updated in [name] chart. Son is on [place].

## 2021-05-19 DIAGNOSIS — R34 Anuria and oliguria: Secondary | ICD-10-CM | POA: Diagnosis not present

## 2021-05-19 DIAGNOSIS — R5383 Other fatigue: Secondary | ICD-10-CM | POA: Diagnosis not present

## 2021-05-19 DIAGNOSIS — E875 Hyperkalemia: Secondary | ICD-10-CM | POA: Diagnosis not present

## 2021-05-19 DIAGNOSIS — B9629 Other Escherichia coli [E. coli] as the cause of diseases classified elsewhere: Secondary | ICD-10-CM | POA: Diagnosis not present

## 2021-05-19 DIAGNOSIS — Z79899 Other long term (current) drug therapy: Secondary | ICD-10-CM | POA: Diagnosis not present

## 2021-05-19 DIAGNOSIS — N390 Urinary tract infection, site not specified: Secondary | ICD-10-CM | POA: Diagnosis not present

## 2021-05-19 DIAGNOSIS — I120 Hypertensive chronic kidney disease with stage 5 chronic kidney disease or end stage renal disease: Secondary | ICD-10-CM | POA: Diagnosis not present

## 2021-05-19 DIAGNOSIS — N185 Chronic kidney disease, stage 5: Secondary | ICD-10-CM | POA: Diagnosis not present

## 2021-05-20 ENCOUNTER — Telehealth: Payer: Self-pay | Admitting: Internal Medicine

## 2021-05-20 NOTE — Telephone Encounter (Signed)
Patient son called in  because missed a call from [place], wanting a call back

## 2021-05-20 NOTE — Telephone Encounter (Signed)
Left message to return call.  JAKIA, KENNEBREW Son   [phone]    Okay to schedule Patient for an appointment with son in person as well if calling back in. [name] son needs to come with the Patient to this appointment.

## 2021-05-22 NOTE — Telephone Encounter (Signed)
Patient scheduled for 05/28/21

## 2021-05-22 NOTE — Telephone Encounter (Signed)
See 05/14/21 telephone encounter. Patient was scheduled to come in 05/28/21

## 2021-05-23 NOTE — Telephone Encounter (Signed)
Received fax that [facility] has been attempting to call Patient to get her scheduled. Called and gave Patient son the number to call back and schedule, [phone].   Patient son verbalized understanding and will call tomorrow morning 05/24/21 to schedule Patient

## 2021-05-23 NOTE — Telephone Encounter (Signed)
Pt really needs [facility] referral if staying in [place] does not come here please re fax as urgent to [facility] ckd 5

## 2021-05-24 NOTE — Telephone Encounter (Signed)
We informed her POA son pt has been hesistant to schedule and hard to contact pt needs to call [facility] for f/u CKD 5 worsening renal failure

## 2021-05-28 ENCOUNTER — Telehealth: Payer: Self-pay | Admitting: Internal Medicine

## 2021-05-28 ENCOUNTER — Ambulatory Visit: Payer: Medicare Other | Admitting: Internal Medicine

## 2021-05-28 NOTE — Telephone Encounter (Signed)
Patient no-showed today's appointment; appointment was for 05/28/21, provider notified for review of record. No fee applied.  Letter sent for patient to call in and re-schedule.

## 2021-06-27 DIAGNOSIS — H2513 Age-related nuclear cataract, bilateral: Secondary | ICD-10-CM | POA: Diagnosis not present

## 2021-06-27 DIAGNOSIS — H401134 Primary open-angle glaucoma, bilateral, indeterminate stage: Secondary | ICD-10-CM | POA: Diagnosis not present

## 2021-06-27 DIAGNOSIS — H1131 Conjunctival hemorrhage, right eye: Secondary | ICD-10-CM | POA: Diagnosis not present

## 2021-08-08 ENCOUNTER — Other Ambulatory Visit: Payer: Self-pay

## 2021-08-08 ENCOUNTER — Encounter: Payer: Self-pay | Admitting: Internal Medicine

## 2021-08-08 ENCOUNTER — Ambulatory Visit (INDEPENDENT_AMBULATORY_CARE_PROVIDER_SITE_OTHER): Payer: Medicare Other | Admitting: Internal Medicine

## 2021-08-08 VITALS — BP 160/84 | HR 60 | Temp 97.9°F | Ht 63.0 in | Wt 141.8 lb

## 2021-08-08 DIAGNOSIS — R7303 Prediabetes: Secondary | ICD-10-CM

## 2021-08-08 DIAGNOSIS — N3000 Acute cystitis without hematuria: Secondary | ICD-10-CM

## 2021-08-08 DIAGNOSIS — N185 Chronic kidney disease, stage 5: Secondary | ICD-10-CM | POA: Diagnosis not present

## 2021-08-08 DIAGNOSIS — E875 Hyperkalemia: Secondary | ICD-10-CM

## 2021-08-08 DIAGNOSIS — Q613 Polycystic kidney, unspecified: Secondary | ICD-10-CM

## 2021-08-08 DIAGNOSIS — E785 Hyperlipidemia, unspecified: Secondary | ICD-10-CM | POA: Diagnosis not present

## 2021-08-08 LAB — COMPREHENSIVE METABOLIC PANEL
ALT: 7 U/L (ref 0–35)
AST: 15 U/L (ref 0–37)
Albumin: 4 g/dL (ref 3.5–5.2)
Alkaline Phosphatase: 80 U/L (ref 39–117)
BUN: 60 mg/dL — ABNORMAL HIGH (ref 6–23)
CO2: 20 mEq/L (ref 19–32)
Calcium: 8.7 mg/dL (ref 8.4–10.5)
Chloride: 109 mEq/L (ref 96–112)
Creatinine, Ser: 6.46 mg/dL (ref 0.40–1.20)
GFR: 5.86 mL/min — CL (ref >60.00)
Glucose, Bld: 72 mg/dL (ref 70–99)
Potassium: 4.5 mEq/L (ref 3.5–5.1)
Sodium: 140 mEq/L (ref 135–145)
Total Bilirubin: 0.5 mg/dL (ref 0.2–1.2)
Total Protein: 6.6 g/dL (ref 6.0–8.3)

## 2021-08-08 LAB — PHOSPHORUS: Phosphorus: 5.2 mg/dL — ABNORMAL HIGH (ref 2.3–4.6)

## 2021-08-08 NOTE — Progress Notes (Signed)
Chief Complaint  Patient presents with   Follow-up   F/u with son Corene Cornea from [place] 1. Ckd 5 with Pkd and noncompliant with BP meds coreg 6.25 mg bid and lasix 40 she is not having swelling wt stable, no fatigue, appetite less she states she is eating less has cancelled multiple kidney referrals in denial gfr 5-7 will check again today BP elevated    Review of Systems  Constitutional:  Negative for weight loss.  HENT:  Negative for hearing loss.   Eyes:  Negative for blurred vision.  Respiratory:  Negative for shortness of breath.   Cardiovascular:  Negative for chest pain.  Gastrointestinal:  Negative for abdominal pain and blood in stool.  Genitourinary:  Negative for dysuria.  Musculoskeletal:  Negative for falls and joint pain.  Skin:  Negative for rash.  Neurological:  Negative for headaches.  Psychiatric/Behavioral:  Negative for depression.   Past Medical History:  Diagnosis Date   Abnormal Pap smear of cervix    mid 40s to 86s    Allergy    Anemia    Brittle nails    Chicken pox    Chronic kidney disease    CKD 4/borderline 5    CKD (chronic kidney disease)    4/5 due to PKD   COVID-19    late 04/2021 s/p MAB   Dementia ([place])    Emphysema of lung ([place])    chronic bronchitis    Fibrocystic breast changes    Glaucoma    [name] [name]   Heart murmur    Hyperlipidemia    Hypertension    Measles    childhood   Secondary hyperparathyroidism ([place])    Shoulder pain    Sleep apnea    Thyroid disease    hypothyroidism    Past Surgical History:  Procedure Laterality Date   ABDOMINAL HYSTERECTOMY     total 2/2 fibroids in [place]  12/2012   Performed at [facility] several years ago   [place]     left with h/o abnormal abi    [place] Left    Performed in [facility] History  Problem Relation Age of Onset   Hypertension Mother    Diabetes Mother     Arthritis Mother    Hypertension Father    Stroke Father        complications from HTN   Prostate cancer Father    Alcohol abuse Brother    COPD Brother    Depression Brother    Drug abuse Brother    Early death Brother    Mental illness Daughter    Diabetes Son    Alcohol abuse Brother    Drug abuse Brother    Early death Brother    Cystic kidney disease Other    Kidney failure Cousin    Sickle cell anemia Other    Stomach cancer Neg Hx    Pancreatic cancer Neg Hx    Esophageal cancer Neg Hx    Social History   Socioeconomic History   Marital status: Widowed    Spouse name: Not on file   Number of children: Not on file   Years of education: Not on file   Highest education level: Not on file  Occupational History   Not on file  Tobacco Use   Smoking status: Never   Smokeless tobacco:  Never  Vaping Use   Vaping Use: Never used  Substance and Sexual Activity   Alcohol use: No    Alcohol/week: 0.0 standard drinks   Drug use: No   Sexual activity: Not Currently  Other Topics Concern   Not on file  Social History Narrative   Husband died 07-May-2017 [place]   4 kids (2 sons, 2 daughters)    Never smoker    Lived in [place] before coming to [place] moved 2014/2015    Retired 2011    [employer]       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   cholecalciferol (VITAMIN D3) 25 MCG (1000 UNIT) tablet Take 1,000 Units by mouth daily.   dorzolamide-timolol (COSOPT) 22.3-6.8 MG/ML ophthalmic solution Place 1 drop into both eyes every morning.   Latanoprostene Bunod  (VYZULTA) 0.024 % SOLN Apply 1 drop to eye at bedtime. Both eyes per Dr. Chalmers Guest   Multiple Vitamin (MULTIVITAMIN WITH MINERALS) TABS tablet Take 1 tablet by mouth daily with lunch.   Allergies  Allergen Reactions   Citrus Itching and Swelling   No results found for this or any previous visit (from the past 2160 hour(s)). Objective  Body mass index is 25.12 kg/m. Wt Readings from Last 3 Encounters:  08/08/21 141 lb 12.8 oz (64.3 kg)  05/09/21 148 lb 3.2 oz (67.2 kg)  09/27/20 150 lb 9.6 oz (68.3 kg)   Temp Readings from Last 3 Encounters:  08/08/21 97.9 F (36.6 C) (Temporal)  05/09/21 97.6 F (36.4 C) (Temporal)  09/27/20 (!) 97.4 F (36.3 C) (Oral)   BP Readings from Last 3 Encounters:  08/08/21 (!) 160/84  05/09/21 (!) 150/90  09/27/20 140/90   Pulse Readings from Last 3 Encounters:  08/08/21 60  05/09/21 67  09/27/20 (!) 56    Physical Exam Vitals and nursing note reviewed.  Constitutional:      Appearance: Normal appearance. She is well-developed and well-groomed.  HENT:     Head: Normocephalic and atraumatic.  Eyes:     Conjunctiva/sclera: Conjunctivae normal.     Pupils: Pupils are equal, round, and reactive to light.  Cardiovascular:     Rate and Rhythm: Normal rate and regular rhythm.     Heart sounds: Normal heart sounds. No murmur heard. Pulmonary:     Effort: Pulmonary effort is normal.     Breath sounds: Normal breath sounds.  Abdominal:     General: Abdomen is flat. Bowel sounds are normal.     Tenderness: There is no abdominal tenderness.  Musculoskeletal:        General: No tenderness.  Skin:    General: Skin is warm and dry.  Neurological:     General: No focal deficit present.     Mental Status: She is alert and oriented to person, place, and time. Mental status is at baseline.     Cranial Nerves: Cranial nerves 2-12 are intact.     Motor: Motor function is intact.     Coordination: Coordination is intact.  Gait: Gait is intact.   Psychiatric:        Attention and Perception: Attention and perception normal.        Mood and Affect: Mood and affect normal.        Speech: Speech normal.        Behavior: Behavior normal. Behavior is cooperative.        Thought Content: Thought content normal.        Cognition and Memory: Cognition and memory normal.        Judgment: Judgment normal.    Assessment  Plan  PKD (polycystic kidney disease) - Plan: Ambulatory referral to Nephrology with ckd 5 consider consult dialysis start   Hyperkalemia  CKD (chronic kidney disease) stage 5, GFR less than 15 ml/min (HCC) - Plan: Comprehensive metabolic panel, Phosphorus, PTH, intact (no Ca), Ambulatory referral to Nephrology  Latest Reference Range & Units 05/09/21 14:22  Sodium 135 - 145 mEq/L 139  Potassium 3.5 - 5.1 mEq/L 5.3 (H)  Chloride 96 - 112 mEq/L 109  CO2 19 - 32 mEq/L 21  Glucose 70 - 99 mg/dL 74  BUN 6 - 23 mg/dL 67 (H)  Creatinine 0.40 - 1.20 mg/dL 6.46 (HH)  Calcium 8.4 - 10.5 mg/dL 8.4  Alkaline Phosphatase 39 - 117 U/L 67  Albumin 3.5 - 5.2 g/dL 3.6  AST 0 - 37 U/L 12  ALT 0 - 35 U/L 6  Total Protein 6.0 - 8.3 g/dL 6.3  Total Bilirubin 0.2 - 1.2 mg/dL 0.3  GFR >60.00 mL/min 5.87 (LL)  (HH): Data is critically high (LL): Data is critically low (H): Data is abnormally high  Noncompliant BP meds  Was on lis 40 mg qd, lasix 40 mg and coreg 6.25 mg bid not taking meds  BP uncontrolled noncompliant   Acute cystitis without hematuria - Plan: Urine Culture   Hypothyroidism, resolved - Plan: TSH Was on thyroid med in the past but normal w/o meds   Hyperlipidemia,  - Plan: Ambulatory referral to Cardiology, Ambulatory referral to Nephrology, Lipid panel Declined statin in the past   Prediabetes     Noncompliance with medications    HM Declines flu shot but will consider  MMR immune rec hep b vaccine not immune disc'ed prev pt wants to think about it  Tdap had 10/19/13 Dr. Heath Gold Will disc  shingrix in future covid 19 had 2/2 pfizer disc consider 3rd vaccine and will need 4th had covid 04/2021  Prevnar had 09/14/17  pna 23 10/19/13    Referred mammogram  11/27/15 [facility] with b/l asymmetries -as of 09/27/20 declines   Last [place]EX[place] in [place] years ago -will disc in future prev referred did not schedule   Pap out of age window  Colonoscopy 01/03/13 colon polyps will disc with pt If wants repeat in 12/2017  -referred Dr. Allen Norris 09/27/20 I hope she goes to this prev referred years ago and did not go   -05/09/21 consider repeat colonoscopy get cards and renal clearance 1st    Never smoker    [facility] Venetia Maxon yet  Provider: Dr. [name] [name]

## 2021-08-08 NOTE — Patient Instructions (Addendum)
Did yall fill out MOST form and mail back if not please do or fill out again and mail back   [facility] [place] Address: [address], [place], [place] [address] Off of [address] ? Closes 5?PM Phone: [phone]  Food Basics for Chronic Kidney Disease Chronic kidney disease (CKD) occurs when the kidneys are permanently damaged over a long period of time. When your kidneys are not working well, they cannot remove waste, fluids, and other substances from your blood as well as they did before. The substances can build up, which can worsen kidney damage and affect how your body functions. Certain foods lead to a buildup of these substances. By changing your diet, you can help prevent more kidney damage and delay or prevent the need for dialysis. What are tips for following this plan? Reading food labels Check the amount of salt (sodium) in foods. Choose foods that have less than 300 milligrams (mg) per serving. Check the ingredient list for phosphorus or potassium-based additives or preservatives. Check the amount of saturated fat and trans fat. Limit or avoid these fats as told by your dietitian. Shopping Avoid buying foods that are: Processed or prepackaged. Calcium-enriched or that have calcium added to them (are fortified). Do not buy foods that have salt or sodium listed among the first five ingredients. Buy canned vegetables and beans that say "no salt added" or "low sodium" and rinse them before eating. Cooking Soak vegetables, such as potatoes, before cooking to reduce potassium. To do this: Peel and cut the vegetables into small pieces. Soak the vegetables in warm water for at least 2 hours. For every 1 cup of vegetables, use 10 cups of water. Drain and rinse the vegetables with warm water. Boil the vegetables for at least 5 minutes. Meal planning Limit the amount of protein you eat from plant and animal sources each day. Do not  add salt to food when cooking or before eating. Eat meals and snacks at around the same time each day. General information Talk with your health care provider about whether you should take a vitamin and mineral supplement. Use standard measuring cups and spoons to measure servings of foods. Use a kitchen scale to measure portions of protein foods. If told by your health care provider, avoid drinking too much fluid. Measure and count all liquids, including water, ice, soups, flavored gelatin, and frozen desserts such as ice pops or ice cream. If you have diabetes: If you have diabetes (diabetes mellitus) and CKD, it is important to keep your blood sugar (glucose) in the target range recommended by your health care provider. Follow your diabetes management plan. This may include: Checking your blood glucose regularly. Taking medicines by mouth, taking insulin, or taking both. Exercising for at least 30 minutes on 5 or more days each week, or as told by your health care provider. Tracking how many servings of carbohydrates you eat at each meal. You may be given specific guidelines on how much of certain foods and nutrients you may eat, depending on your stage of kidney disease and whether you have high blood pressure (hypertension). Follow your meal plan as told by your dietitian. What nutrients should I limit? Work with your health care provider and dietitian to develop a meal plan that is right for you. Foods you can eat and foods you should limit or avoid will depend on the stage of your kidney disease and any other health conditions you have.  The items listed below are not a complete list. Talk with your dietitian about what dietary choices are best for you. Potassium Potassium affects how steadily your heart beats. If too much potassium builds up in your blood, the potassium can cause an irregular heartbeat or even a heart attack. You may need to limit or avoid foods that are high in potassium,  such as: Milk and soy milk. Fruits, such as bananas, apricots, nectarines, melon, prunes, raisins, kiwi, and oranges. Vegetables, such as potatoes, sweet potatoes, yams, tomatoes, leafy greens, beets, avocado, pumpkin, and winter squash. White and lima beans. Whole-wheat breads and pastas. Beans and nuts. Phosphorus Phosphorus is a mineral found in your bones. A balance between calcium and phosphorus is needed to build and maintain healthy bones. Too much phosphorus pulls calcium from your bones. This can make your bones weak and more likely to break. Too much phosphorus can also make your skin itch. You may need to limit or avoid foods that are high in phosphorus, such as: Milk and dairy products. Dried beans and peas. Tofu, soy milk, and other soy-based meat replacements. Dark-colored sodas. Nuts and peanut butter. Meat, poultry, and fish. Bran cereals and oatmeal. Protein Protein helps you make and keep muscle. It also helps to repair your body's cells and tissues. One of the natural breakdown products of protein is a waste product called urea. When your kidneys are not working properly, they cannot remove wastes, such as urea. Reducing how much protein you eat can help prevent a buildup of urea in your blood. Depending on your stage of kidney disease, you may need to limit foods that are high in protein. Sources of animal protein include: Meat (all types). Fish and seafood. Poultry. Eggs. Dairy. Other protein foods include: Beans and legumes. Nuts and nut butter. Soy and tofu.  Sodium Sodium helps to maintain a healthy balance of fluids in your body. Too much sodium can increase your blood pressure and have a negative effect on your heart and lungs. Too much sodium can also cause your body to retain too much fluid, making your kidneys work harder. Most people should have less than 2,300 mg of sodium each day. If you have hypertension, you may need to limit your sodium to 1,500 mg  each day. You may need to limit or avoid foods that are high in sodium, such as: Salt seasonings. Soy sauce. Cured and processed meats. Salted crackers and snack foods. Fast food. Canned soups and most canned foods. Pickled foods. Vegetable juice. Boxed mixes or ready-to-eat boxed meals and side dishes. Bottled dressings, sauces, and marinades. Talk with your dietitian about how much potassium, phosphorus, protein, and sodium you may have each day. Summary Chronic kidney disease (CKD) can lead to a buildup of waste and extra substances in the body. Certain foods lead to a buildup of these substances. By changing your diet as told, you can help prevent more kidney damage and delay or prevent the need for dialysis. Food intake changes are different for each person with CKD. Work with a dietitian to set up nutrient goals and a meal plan that is right for you. If you have diabetes and CKD, it is important to keep your blood sugar in the target range recommended by your health care provider. This information is not intended to replace advice given to you by your health care provider. Make sure you discuss any questions you have with your health care provider. Document Revised: 09/12/2019 Document Reviewed: 09/12/2019 Elsevier Patient  Education  2022 [place] Kidney Disease, Adult Chronic kidney disease (CKD) occurs when the kidneys are slowly and permanently damaged over a long period of time. The kidneys are a pair of organs that do many important jobs in the body, including: Removing waste and extra fluid from the blood to make urine. Making hormones that maintain the amount of fluid in tissues and blood vessels. Maintaining the right amount of fluids and chemicals in the body. A small amount of kidney damage may not cause problems, but a large amount of damage may make it hard or impossible for the kidneys to work right. Steps must be taken to slow kidney damage or to stop it  from getting worse. If steps are not taken, the kidneys may stop working permanently (end-stage renal disease, or ESRD). Most of the time, CKD does not go away, but it can often be controlled. People who have CKD are usually able to live full lives. What are the causes? The most common causes of this condition are diabetes and high blood pressure (hypertension). Other causes include: Cardiovascular diseases. These affect the heart and blood vessels. Kidney diseases. These include: Glomerulonephritis, or inflammation of the tiny filters in the kidneys. Interstitial nephritis. This is swelling of the small tubes of the kidneys and of the surrounding structures. Polycystic kidney disease, in which clusters of fluid-filled sacs form within the kidneys. Renal vascular disease. This includes disorders that affect the arteries and veins of the kidneys. Diseases that affect the body's defense system (immune system). A problem with urine flow. This may be caused by: Kidney stones. Cancer. An enlarged prostate, in males. A kidney infection or urinary tract infection (UTI) that keeps coming back. Vasculitis. This is swelling or inflammation of the blood vessels. What increases the risk? Your chances of having kidney disease increase with age. The following factors may make you more likely to develop this condition: A family history of kidney disease or kidney failure. Kidney failure means the kidneys can no longer work right. Certain genetic diseases. Taking medicines often that are damaging to the kidneys. Being around or being in contact with toxic substances. Obesity. A history of tobacco use. What are the signs or symptoms? Symptoms of this condition include: Feeling very tired (lethargic) and having less energy. Swelling, or edema, of the face, legs, ankles, or feet. Nausea or vomiting, or loss of appetite. Confusion or trouble concentrating. Muscle twitches and cramps, especially in the  legs. Dry, itchy skin. A metallic taste in the mouth. Producing less urine, or producing more urine (especially at night). Shortness of breath. Trouble sleeping. CKD may also result in not having enough red blood cells or hemoglobin in the blood (anemia) or having weak bones (bone disease). Symptoms develop slowly and may not be obvious until the kidney damage becomes severe. It is possible to have kidney disease for years without having symptoms. How is this diagnosed? This condition may be diagnosed based on: Blood tests. Urine tests. Imaging tests, such as an ultrasound or a CT scan. A kidney biopsy. This involves removing a sample of kidney tissue to be looked at under a microscope. Results from these tests will help to determine how serious the CKD is. How is this treated? There is no cure for most cases of this condition, but treatment usually relieves symptoms and prevents or slows the worsening of the disease. Treatment may include: Diet changes, which may require you to avoid alcohol and foods that are high in salt,  potassium, phosphorous, and protein. Medicines. These may: Lower blood pressure. Control blood sugar (glucose). Relieve anemia. Relieve swelling. Protect your bones. Improve the balance of salts and minerals in your blood (electrolytes). Dialysis, which is a type of treatment that removes toxic waste from the body. It may be needed if you have kidney failure. Managing any other conditions that are causing your CKD or making it worse. Follow these instructions at home: Medicines Take over-the-counter and prescription medicines only as told by your health care provider. The amount of some medicines that you take may need to be changed. Do not take any new medicines unless approved by your health care provider. Many medicines can make kidney damage worse. Do not take any vitamin and mineral supplements unless approved by your health care provider. Many nutritional  supplements can make kidney damage worse. Lifestyle  Do not use any products that contain nicotine or tobacco, such as cigarettes, e-cigarettes, and chewing tobacco. If you need help quitting, ask your health care provider. If you drink alcohol: Limit how much you use to: 0-1 drink a day for women who are not pregnant. 0-2 drinks a day for men. Know how much alcohol is in your drink. In the [place] one drink equals one 12 oz bottle of beer (355 mL), one 5 oz glass of wine (148 mL), or one 1 oz glass of hard liquor (44 mL). Maintain a healthy weight. If you need help, ask your health care provider. General instructions  Follow instructions from your health care provider about eating or drinking restrictions, including any prescribed diet. Track your blood pressure at home. Report changes in your blood pressure as told. If you are being treated for diabetes, track your blood glucose levels as told. Start or continue an exercise plan. Exercise at least 30 minutes a day, 5 days a week. Keep your immunizations up to date as told. Keep all follow-up visits. This is important. Where to find more information American Association of Kidney Patients: BombTimer.gl National Kidney Foundation: www.kidney.[place]: https://mathis.com/ Life Options: www.lifeoptions.org Kidney School: www.kidneyschool.org Contact a health care provider if: Your symptoms get worse. You develop new symptoms. Get help right away if: You develop symptoms of ESRD. These include: Headaches. Numbness in your hands or feet. Easy bruising. Frequent hiccups. Chest pain. Shortness of breath. Lack of menstrual periods, in women. You have a fever. You are producing less urine than usual. You have pain or bleeding when you urinate or when you have a bowel movement. These symptoms may represent a serious problem that is an emergency. Do not wait to see if the symptoms will go away. Get medical help right away. Call  your local emergency services (911 in the [place] Do not drive yourself to the hospital. Summary Chronic kidney disease (CKD) occurs when the kidneys become damaged slowly over a long period of time. The most common causes of this condition are diabetes and high blood pressure (hypertension). There is no cure for most cases of CKD, but treatment usually relieves symptoms and prevents or slows the worsening of the disease. Treatment may include a combination of lifestyle changes, medicines, and dialysis. This information is not intended to replace advice given to you by your health care provider. Make sure you discuss any questions you have with your health care provider. Document Revised: 08/24/2019 Document Reviewed: 08/24/2019 Elsevier Patient Education  [place].

## 2021-08-12 LAB — PARATHYROID HORMONE, INTACT (NO CA): PTH: 353 pg/mL — ABNORMAL HIGH (ref 16–77)

## 2021-08-19 DIAGNOSIS — I120 Hypertensive chronic kidney disease with stage 5 chronic kidney disease or end stage renal disease: Secondary | ICD-10-CM | POA: Diagnosis not present

## 2021-08-19 DIAGNOSIS — N2581 Secondary hyperparathyroidism of renal origin: Secondary | ICD-10-CM | POA: Diagnosis not present

## 2021-08-19 DIAGNOSIS — Q613 Polycystic kidney, unspecified: Secondary | ICD-10-CM | POA: Diagnosis not present

## 2021-08-19 DIAGNOSIS — N185 Chronic kidney disease, stage 5: Secondary | ICD-10-CM | POA: Diagnosis not present

## 2021-08-23 ENCOUNTER — Encounter (INDEPENDENT_AMBULATORY_CARE_PROVIDER_SITE_OTHER): Payer: Self-pay

## 2021-08-23 DIAGNOSIS — H35372 Puckering of macula, left eye: Secondary | ICD-10-CM | POA: Diagnosis not present

## 2021-08-23 DIAGNOSIS — H401134 Primary open-angle glaucoma, bilateral, indeterminate stage: Secondary | ICD-10-CM | POA: Diagnosis not present

## 2021-08-23 DIAGNOSIS — H2513 Age-related nuclear cataract, bilateral: Secondary | ICD-10-CM | POA: Diagnosis not present

## 2021-08-23 DIAGNOSIS — H34832 Tributary (branch) retinal vein occlusion, left eye, with macular edema: Secondary | ICD-10-CM | POA: Diagnosis not present

## 2021-08-23 DIAGNOSIS — H3562 Retinal hemorrhage, left eye: Secondary | ICD-10-CM | POA: Diagnosis not present

## 2021-09-02 ENCOUNTER — Encounter (INDEPENDENT_AMBULATORY_CARE_PROVIDER_SITE_OTHER): Payer: Medicare Other | Admitting: Ophthalmology

## 2021-10-18 ENCOUNTER — Ambulatory Visit: Payer: Medicare Other

## 2021-10-18 ENCOUNTER — Inpatient Hospital Stay: Admission: RE | Admit: 2021-10-18 | Payer: Medicare Other | Source: Ambulatory Visit

## 2021-11-26 DIAGNOSIS — N2581 Secondary hyperparathyroidism of renal origin: Secondary | ICD-10-CM | POA: Diagnosis not present

## 2021-11-26 DIAGNOSIS — I120 Hypertensive chronic kidney disease with stage 5 chronic kidney disease or end stage renal disease: Secondary | ICD-10-CM | POA: Diagnosis not present

## 2021-11-26 DIAGNOSIS — N185 Chronic kidney disease, stage 5: Secondary | ICD-10-CM | POA: Diagnosis not present

## 2021-11-26 DIAGNOSIS — Q613 Polycystic kidney, unspecified: Secondary | ICD-10-CM | POA: Diagnosis not present

## 2021-12-20 ENCOUNTER — Ambulatory Visit (INDEPENDENT_AMBULATORY_CARE_PROVIDER_SITE_OTHER): Payer: Medicare Other | Admitting: Internal Medicine

## 2021-12-20 ENCOUNTER — Encounter: Payer: Self-pay | Admitting: Internal Medicine

## 2021-12-20 VITALS — BP 130/82 | HR 57 | Temp 97.7°F | Ht 63.0 in | Wt 140.4 lb

## 2021-12-20 DIAGNOSIS — L509 Urticaria, unspecified: Secondary | ICD-10-CM

## 2021-12-20 DIAGNOSIS — L309 Dermatitis, unspecified: Secondary | ICD-10-CM | POA: Diagnosis not present

## 2021-12-20 DIAGNOSIS — N185 Chronic kidney disease, stage 5: Secondary | ICD-10-CM | POA: Diagnosis not present

## 2021-12-20 MED ORDER — HYDROCORTISONE 2.5 % EX OINT: TOPICAL_OINTMENT | Freq: Two times a day (BID) | CUTANEOUS | 0 refills | Status: DC

## 2021-12-20 NOTE — Progress Notes (Signed)
Chief Complaint  Patient presents with   Rash    Rash on face started on the neck and pt states it is itcy   F/u  1. Rash to right neck, right cheek and left cheek after eating pineapple yesterday rash is itching nothing tried   2. 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 Joelyn Oms in [facility] given low K food list  Denies sob wt stable not taking meds for htn   Review of Systems  Constitutional:  Negative for weight loss.  HENT:  Negative for hearing loss.   Eyes:  Negative for blurred vision.  Respiratory:  Negative for shortness of breath.   Cardiovascular:  Negative for chest pain.  Gastrointestinal:  Negative for abdominal pain and blood in stool.  Genitourinary:  Negative for dysuria.  Musculoskeletal:  Negative for falls and joint pain.  Skin:  Positive for itching and rash.  Neurological:  Negative for headaches.  Psychiatric/Behavioral:  Negative for depression.    Past Medical History:  Diagnosis Date   Abnormal Pap smear of cervix    mid 40s to 50s    Allergy    Anemia    Brittle nails    Chicken pox    Chronic kidney disease    CKD 4/borderline 5    CKD (chronic kidney disease)    4/5 due to PKD   COVID-19    late 04/2021 s/p MAB   Dementia ([place])    Emphysema of lung ([place])    chronic bronchitis    Fibrocystic breast changes    Glaucoma    [name] [name]   Heart murmur    Hyperlipidemia    Hypertension    Measles    childhood   Secondary hyperparathyroidism ([place])    Shoulder pain    Sleep apnea    Thyroid disease    hypothyroidism    Past Surgical History:  Procedure Laterality Date   ABDOMINAL HYSTERECTOMY     total 2/2 fibroids in [place]  12/2012   Performed at [facility] several years ago   [place]     left with h/o abnormal abi    [place] Left    Performed in [place] History  Problem Relation Age of Onset    Hypertension Mother    Diabetes Mother    Arthritis Mother    Hypertension Father    Stroke Father        complications from HTN   Prostate cancer Father    Alcohol abuse Brother    COPD Brother    Depression Brother    Drug abuse Brother    Early death Brother    Mental illness Daughter    Diabetes Son    Alcohol abuse Brother    Drug abuse Brother    Early death Brother    Cystic kidney disease Other    Kidney failure Cousin    Sickle cell anemia Other    Stomach cancer Neg Hx    Pancreatic cancer Neg Hx    Esophageal cancer Neg Hx    Social History   Socioeconomic History   Marital status: Widowed    Spouse name: Not on file   Number of children: Not on file   Years of education: Not on file   Highest education level: Not on file  Occupational History  Not on file  Tobacco Use   Smoking status: Never   Smokeless tobacco: Never  Vaping Use   Vaping Use: Never used  Substance and Sexual Activity   Alcohol use: No    Alcohol/week: 0.0 standard drinks of alcohol   Drug use: No   Sexual activity: Not Currently  Other Topics Concern   Not on file  Social History Narrative   Husband died 05/12/17 [name]   4 kids (2 sons, 2 daughters)    Never smoker    Lived in [place] before coming to [place] moved 2014/2015    Retired 2011    [employer]       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 and son [name]    Social Determinants of Health   Financial Resource Strain: [place]  (11/10/2017)   Overall Financial Resource Strain (CARDIA)    Difficulty of Paying Living Expenses: Not hard at all  Food Insecurity: No Food Insecurity (11/10/2017)   Hunger Vital Sign    Worried About Running Out of Food in the Last Year: Never true    [place] in the Last Year: Never true  Transportation Needs: No Transportation Needs (11/10/2017)   PRAPARE - Hydrologist (Medical): No    Lack of Transportation (Non-Medical): No  Physical Activity: Not on file  Stress:  No Stress Concern Present (11/10/2017)   [place]    Feeling of Stress : Not at all  Social Connections: Not on file  Intimate Partner Violence: Not on file   Current Meds  Medication Sig   carvedilol (COREG) 6.25 MG tablet Take 1 tablet (6.25 mg total) by mouth 2 (two) times daily with a meal.   cholecalciferol (VITAMIN D3) 25 MCG (1000 UNIT) tablet Take 1,000 Units by mouth daily.   dorzolamide-timolol (COSOPT) 22.3-6.8 MG/ML ophthalmic solution Place 1 drop into both eyes every morning.   hydrocortisone 2.5 % ointment Apply topically 2 (two) times daily. To neck and face rash   Latanoprostene Bunod (VYZULTA) 0.024 % SOLN Apply 1 drop to eye at bedtime. Both eyes per [name]   Multiple Vitamin (MULTIVITAMIN WITH MINERALS) TABS tablet Take 1 tablet by mouth daily with lunch.   Allergies  Allergen Reactions   Citrus Itching and Swelling   No results found for this or any previous visit (from the past 2160 hour(s)). Objective  Body mass index is 24.87 kg/m. Wt Readings from Last 3 Encounters:  12/20/21 140 lb 6.4 oz (63.7 kg)  08/08/21 141 lb 12.8 oz (64.3 kg)  05/09/21 148 lb 3.2 oz (67.2 kg)   Temp Readings from Last 3 Encounters:  12/20/21 97.7 F (36.5 C) (Oral)  08/08/21 97.9 F (36.6 C) (Temporal)  05/09/21 97.6 F (36.4 C) (Temporal)   BP Readings from Last 3 Encounters:  12/20/21 130/82  08/08/21 (!) 160/84  05/09/21 (!) 150/90   Pulse Readings from Last 3 Encounters:  12/20/21 (!) 57  08/08/21 60  05/09/21 67    Physical Exam Vitals and nursing note reviewed.  Constitutional:      Appearance: Normal appearance. She is well-developed and well-groomed.  HENT:     Head: Normocephalic and atraumatic.  Eyes:     Conjunctiva/sclera: Conjunctivae normal.     Pupils: Pupils are equal, round, and reactive to light.  Cardiovascular:     Rate and Rhythm: Normal rate and regular rhythm.      Heart sounds: Normal  heart sounds. No murmur heard. Pulmonary:     Effort: Pulmonary effort is normal.     Breath sounds: Normal breath sounds.  Abdominal:     General: Abdomen is flat. Bowel sounds are normal.     Tenderness: There is no abdominal tenderness.  Musculoskeletal:        General: No tenderness.  Skin:    General: Skin is warm and dry.       Neurological:     General: No focal deficit present.     Mental Status: She is alert and oriented to person, place, and time. Mental status is at baseline.     Cranial Nerves: Cranial nerves 2-12 are intact.     Motor: Motor function is intact.     Coordination: Coordination is intact.     Gait: Gait is intact.  Psychiatric:        Attention and Perception: Attention and perception normal.        Mood and Affect: Mood and affect normal.        Speech: Speech normal.        Behavior: Behavior normal. Behavior is cooperative.        Thought Content: Thought content normal.        Cognition and Memory: Cognition and memory normal.        Judgment: Judgment normal.     Assessment  Plan  Hives likely due to pineapple intake- Plan: hydrocortisone 2.5 % ointment Ask the pharmacist to show you where the products are in the store [place] or caladryl over the counter  Use hydrocortisone 2.5 2x per day -prescription For itching (pill or liquid)  -->Optional Can try zyrtec 1/2 dose =5 mg over the counter  Aveeno products they have oat in them -skin relief body wash and cream  Declines steroid shot  Eczema, unspecified type - Plan: hydrocortisone 2.5 % ointment  CKD (chronic kidney disease) stage 5, GFR less than 15 ml/min (HCC)  F/u D.r Joelyn Oms 02/26/22  Declines htn meds and dialysis for now   HM See last visit   Provider: Dr. [name] [name]-Internal Medicine

## 2021-12-20 NOTE — Patient Instructions (Addendum)
Ask the pharmacist to show you where the products are in the store [place] or caladryl over the counter  Use hydrocortisone 2.5 2x per day -prescription For itching (pill or liquid)  -->Optional Can try zyrtec 1/2 dose =5 mg over the counter  Aveeno products they have oat in them -skin relief body wash and cream    Due to follow with kidney doctor in 02/26/22   Eczema Eczema refers to a group of skin conditions that cause skin to become rough and inflamed. Each type of eczema has different triggers, symptoms, and treatments. Eczema of any type is usually itchy. Symptoms range from mild to severe. Eczema is not spread from person to person (is not contagious). It can appear on different parts of the body at different times. One person's eczema may look different from another person's eczema. What are the causes? The exact cause of this condition is not known. However, exposure to certain environmental factors, irritants, and allergens can make the condition worse. What are the signs or symptoms? Symptoms of this condition depend on the type of eczema you have. The types include: Contact dermatitis. There are two kinds: Irritant contact dermatitis. This happens when something irritates the skin and causes a rash. Allergic contact dermatitis. This happens when your skin comes in contact with something you are allergic to (allergens). This can include poison ivy, chemicals, or medicines that were applied to your skin. Atopic dermatitis. This is a long-term (chronic) skin disease that keeps coming back (recurring). It is the most common type of eczema. Usual symptoms are a red rash and itchy, dry, scaly skin. It usually starts showing signs in infancy and can last through adulthood. Dyshidrotic eczema. This is a form of eczema on the hands and feet. It shows up as very itchy, fluid-filled blisters. It can affect people of any age but is more common before age 98. Hand eczema. This causes very itchy  areas of skin on the palms and sides of the hands and fingers. This type of eczema is common in industrial jobs where you may be exposed to different types of irritants. Lichen simplex chronicus. This type of eczema occurs when a person constantly scratches one area of the body. Repeated scratching of the area leads to thickened skin (lichenification). This condition can accompany other types of eczema. It is more common in adults but may also be seen in children. Nummular eczema. This is a common type of eczema that most often affects the lower legs and the backs of the hands. It typically causes an itchy, red, circular, crusty lesion (plaque). Scratching may become a habit and can cause bleeding. Nummular eczema occurs most often in middle-aged or older people. Seborrheic dermatitis. This is a common skin disease that mainly affects the scalp. It may also affect other oily areas of the body, such as the face, sides of the nose, eyebrows, ears, eyelids, and chest. It is marked by small scaling and redness of the skin (erythema). This can affect people of all ages. In infants, this condition is called cradle cap. Stasis dermatitis. This is a common skin disease that can cause itching, scaling, and hyperpigmentation, usually on the legs and feet. It occurs most often in people who have a condition that prevents blood from being pumped through the veins in the legs (chronic venous insufficiency). Stasis dermatitis is a chronic condition that needs long-term management. How is this diagnosed? This condition may be diagnosed based on: A physical exam of your skin. Your  medical history. Skin patch tests. These tests involve using patches that contain possible allergens and placing them on your back. Your health care provider will check in a few days to see if an allergic reaction occurred. How is this treated? Treatment for eczema is based on the type of eczema you have. You may be given hydrocortisone steroid  medicine or antihistamines. These can relieve itching quickly and help reduce inflammation. These may be prescribed or purchased over the counter, depending on the strength that is needed. Follow these instructions at home: Take or apply over-the-counter and prescription medicines only as told by your health care provider. Use creams or ointments to moisturize your skin. Do not use lotions. Learn what triggers or irritates your symptoms so you can avoid these things. Treat symptom flare-ups quickly. Do not scratch your skin. This can make your rash worse. Keep all follow-up visits. This is important. Where to find more information American Academy of Dermatology: MemberVerification.ca National Eczema Association: nationaleczema.org The Society for Pediatric Dermatology: pedsderm.net Contact a health care provider if: You have severe itching, even with treatment. You scratch your skin regularly until it bleeds. Your rash looks different than usual. Your skin is painful, swollen, or more red than usual. You have a fever. Summary Eczema refers to a group of skin conditions that cause skin to become rough and inflamed. Each type has different triggers. Eczema of any type causes itching that may range from mild to severe. Treatment varies based on the type of eczema you have. Hydrocortisone steroid medicine or antihistamines can help with itching and inflammation. Protecting your skin is the best way to prevent eczema. Use creams or ointments to moisturize your skin. Avoid triggers and irritants. Treat flare-ups quickly. This information is not intended to replace advice given to you by your health care provider. Make sure you discuss any questions you have with your health care provider. Document Revised: 02/27/2020 Document Reviewed: 02/27/2020 Elsevier Patient Education  [place] (urticaria) are itchy, red, swollen areas on the skin. Hives can appear on any part of the body.  Hives often fade within 24 hours (acute hives). Sometimes, new hives appear after old ones fade and the cycle can continue for several days or weeks (chronic hives). Hives do not spread from person to person (are not contagious). Hives come from the body's reaction to something a person is allergic to (allergen), something that causes irritation, or various other triggers. When a person is exposed to a trigger, his or her body releases a chemical (histamine) that causes redness, itching, and swelling. Hives can appear right after exposure to a trigger or hours later. What are the causes? This condition may be caused by: Allergies to foods or ingredients. Insect bites or stings. Exposure to pollen or pets. Spending time in sunlight, heat, or cold (exposure). Exercise. Stress. You can also get hives from other medical conditions and treatments, such as: Viruses, including the common cold. Bacterial infections, such as urinary tract infections and strep throat. Certain medicines. Contact with latex or chemicals. Allergy shots. Blood transfusions. Sometimes, the cause of this condition is not known (idiopathic hives). What increases the risk? You are more likely to develop this condition if you: Are a woman. Have food allergies, especially to citrus fruits, milk, eggs, peanuts, tree nuts, or shellfish. Are allergic to: Medicines. Latex. Insects. Animals. Pollen. What are the signs or symptoms? Common symptoms of this condition include raised, itchy, red or white bumps or patches  on your skin. These areas may: Become large and swollen (welts). Change in shape and location, quickly and repeatedly. Be separate hives or connect over a large area of skin. Sting or become painful. Turn white when pressed in the center (blanch). In severe cases, your hands, feet, and face may also become swollen. This may occur if hives develop deeper in your skin. How is this diagnosed? This condition may  be diagnosed by your symptoms, medical history, and physical exam. Your skin, urine, or blood may be tested to find out what is causing your hives and to rule out other health issues. Your health care provider may also remove a small sample of skin from the affected area and examine it under a microscope (biopsy). How is this treated? Treatment for this condition depends on the cause and severity of your symptoms. Your health care provider may recommend using cool, wet cloths (cool compresses) or taking cool showers to relieve itching. Treatment may include: Medicines that help: Relieve itching (antihistamines). Reduce swelling (corticosteroids). Treat infection (antibiotics). An injectable medicine (omalizumab). Your health care provider may prescribe this if you have chronic idiopathic hives and you continue to have symptoms even after treatment with antihistamines. Severe cases may require an emergency injection of adrenaline (epinephrine) to prevent a life-threatening allergic reaction (anaphylaxis). Follow these instructions at home: Medicines Take and apply over-the-counter and prescription medicines only as told by your health care provider. If you were prescribed an antibiotic medicine, take it as told by your health care provider. Do not stop using the antibiotic even if you start to feel better. Skin care Apply cool compresses to the affected areas. Do not scratch or rub your skin. General instructions Do not take hot showers or baths. This can make itching worse. Do not wear tight-fitting clothing. Use sunscreen and wear protective clothing when you are outside. Avoid any substances that cause your hives. Keep a journal to help track what causes your hives. Write down: What medicines you take. What you eat and drink. What products you use on your skin. Keep all follow-up visits as told by your health care provider. This is important. Contact a health care provider if: Your  symptoms are not controlled with medicine. Your joints are painful or swollen. Get help right away if: You have a fever. You have pain in your abdomen. Your tongue or lips are swollen. Your eyelids are swollen. Your chest or throat feels tight. You have trouble breathing or swallowing. These symptoms may represent a serious problem that is an emergency. Do not wait to see if the symptoms will go away. Get medical help right away. Call your local emergency services (911 in the [place] Do not drive yourself to the hospital. Summary Hives (urticaria) are itchy, red, swollen areas on your skin. Hives come from the body's reaction to something a person is allergic to (allergen), something that causes irritation, or various other triggers. Treatment for this condition depends on the cause and severity of your symptoms. Avoid any substances that cause your hives. Keep a journal to help track what causes your hives. Take and apply over-the-counter and prescription medicines only as told by your health care provider. Get help right away if your chest or throat feels tight or if you have trouble breathing or swallowing. This information is not intended to replace advice given to you by your health care provider. Make sure you discuss any questions you have with your health care provider. Document Revised: 07/08/2020 Document Reviewed: 07/08/2020  Elsevier Patient Education  2023 Elsevier Inc.  

## 2022-01-22 ENCOUNTER — Ambulatory Visit (INDEPENDENT_AMBULATORY_CARE_PROVIDER_SITE_OTHER): Payer: Medicare Other | Admitting: Internal Medicine

## 2022-01-22 ENCOUNTER — Encounter: Payer: Self-pay | Admitting: Internal Medicine

## 2022-01-22 VITALS — BP 150/80 | HR 62 | Temp 98.1°F | Ht 63.0 in | Wt 141.8 lb

## 2022-01-22 DIAGNOSIS — D649 Anemia, unspecified: Secondary | ICD-10-CM

## 2022-01-22 DIAGNOSIS — E785 Hyperlipidemia, unspecified: Secondary | ICD-10-CM

## 2022-01-22 DIAGNOSIS — I10 Essential (primary) hypertension: Secondary | ICD-10-CM | POA: Diagnosis not present

## 2022-01-22 DIAGNOSIS — Q613 Polycystic kidney, unspecified: Secondary | ICD-10-CM | POA: Diagnosis not present

## 2022-01-22 DIAGNOSIS — N185 Chronic kidney disease, stage 5: Secondary | ICD-10-CM | POA: Diagnosis not present

## 2022-01-22 DIAGNOSIS — Z01818 Encounter for other preprocedural examination: Secondary | ICD-10-CM | POA: Diagnosis not present

## 2022-01-22 DIAGNOSIS — R7303 Prediabetes: Secondary | ICD-10-CM | POA: Diagnosis not present

## 2022-01-22 DIAGNOSIS — I120 Hypertensive chronic kidney disease with stage 5 chronic kidney disease or end stage renal disease: Secondary | ICD-10-CM

## 2022-01-22 DIAGNOSIS — E2839 Other primary ovarian failure: Secondary | ICD-10-CM | POA: Diagnosis not present

## 2022-01-22 LAB — COMPREHENSIVE METABOLIC PANEL
ALT: 9 U/L (ref 0–35)
AST: 16 U/L (ref 0–37)
Albumin: 4 g/dL (ref 3.5–5.2)
Alkaline Phosphatase: 92 U/L (ref 39–117)
BUN: 82 mg/dL — ABNORMAL HIGH (ref 6–23)
CO2: 14 mEq/L — ABNORMAL LOW (ref 19–32)
Calcium: 8.4 mg/dL (ref 8.4–10.5)
Chloride: 107 mEq/L (ref 96–112)
Creatinine, Ser: 7.01 mg/dL (ref 0.40–1.20)
GFR: 5.29 mL/min — CL (ref >60.00)
Glucose, Bld: 69 mg/dL — ABNORMAL LOW (ref 70–99)
Potassium: 5.3 mEq/L — ABNORMAL HIGH (ref 3.5–5.1)
Sodium: 139 mEq/L (ref 135–145)
Total Bilirubin: 0.4 mg/dL (ref 0.2–1.2)
Total Protein: 6.7 g/dL (ref 6.0–8.3)

## 2022-01-22 LAB — CBC WITH DIFFERENTIAL/PLATELET
Basophils Absolute: 0 10*3/uL (ref 0.0–0.1)
Basophils Relative: 0.7 % (ref 0.0–3.0)
Eosinophils Absolute: 0 10*3/uL (ref 0.0–0.7)
Eosinophils Relative: 1 % (ref 0.0–5.0)
HCT: 31.1 % — ABNORMAL LOW (ref 36.0–46.0)
Hemoglobin: 9.8 g/dL — ABNORMAL LOW (ref 12.0–15.0)
Lymphocytes Relative: 33 % (ref 12.0–46.0)
Lymphs Abs: 1 10*3/uL (ref 0.7–4.0)
MCHC: 31.5 g/dL (ref 30.0–36.0)
MCV: 94.6 fl (ref 78.0–100.0)
Monocytes Absolute: 0.3 10*3/uL (ref 0.1–1.0)
Monocytes Relative: 9.9 % (ref 3.0–12.0)
Neutro Abs: 1.7 10*3/uL (ref 1.4–7.7)
Neutrophils Relative %: 55.4 % (ref 43.0–77.0)
Platelets: 168 10*3/uL (ref 150.0–400.0)
RBC: 3.29 Mil/uL — ABNORMAL LOW (ref 3.87–5.11)
RDW: 13.9 % (ref 11.5–15.5)
WBC: 3.1 10*3/uL — ABNORMAL LOW (ref 4.0–10.5)

## 2022-01-22 LAB — LIPID PANEL
Cholesterol: 284 mg/dL — ABNORMAL HIGH (ref 0–200)
HDL: 79.4 mg/dL (ref >39.00)
LDL Cholesterol: 187 mg/dL — ABNORMAL HIGH (ref 0–99)
NonHDL: 204.45
Total CHOL/HDL Ratio: 4
Triglycerides: 86 mg/dL (ref 0.0–149.0)
VLDL: 17.2 mg/dL (ref 0.0–40.0)

## 2022-01-22 LAB — HEMOGLOBIN A1C: Hgb A1c MFr Bld: 5.5 % (ref 4.6–6.5)

## 2022-01-22 MED ORDER — CARVEDILOL 6.25 MG PO TABS: 6.2500 mg | ORAL_TABLET | Freq: Two times a day (BID) | ORAL | 3 refills | Status: DC

## 2022-01-22 NOTE — Progress Notes (Signed)
Chief Complaint  Patient presents with   Follow-up    5 MONTH F/U   F/u  1. Ckd V not on dialysis refuses and htn noncompliant with meds rec at least pt try coreg 6.25 mg bid will consider  2. Wants colonoscopy but will need cards clearance    Review of Systems  Constitutional:  Negative for weight loss.  HENT:  Negative for hearing loss.   Eyes:  Negative for blurred vision.  Respiratory:  Negative for shortness of breath.   Cardiovascular:  Negative for chest pain.  Gastrointestinal:  Negative for abdominal pain and blood in stool.  Genitourinary:  Negative for dysuria.  Musculoskeletal:  Negative for falls and joint pain.  Skin:  Negative for rash.  Neurological:  Negative for headaches.  Psychiatric/Behavioral:  Negative for depression.    Past Medical History:  Diagnosis Date   Abnormal Pap smear of cervix    mid 40s to 32s    Allergy    Anemia    Brittle nails    Chicken pox    Chronic kidney disease    CKD 4/borderline 5    CKD (chronic kidney disease)    4/5 due to PKD   COVID-19    late 04/2021 s/p MAB   Dementia ([place])    Emphysema of lung ([place])    chronic bronchitis    Fibrocystic breast changes    Glaucoma    [name] [name]   Heart murmur    Hyperlipidemia    Hypertension    Measles    childhood   Secondary hyperparathyroidism ([place])    Shoulder pain    Sleep apnea    Thyroid disease    hypothyroidism    Past Surgical History:  Procedure Laterality Date   ABDOMINAL HYSTERECTOMY     total 2/2 fibroids in [place]  12/2012   Performed at [facility] several years ago   [place]     left with h/o abnormal abi    [place] Left    Performed in [place] History  Problem Relation Age of Onset   Hypertension Mother    Diabetes Mother    Arthritis Mother    Hypertension Father    Stroke Father        complications from HTN   Prostate cancer  Father    Alcohol abuse Brother    COPD Brother    Depression Brother    Drug abuse Brother    Early death Brother    Mental illness Daughter    Diabetes Son    Alcohol abuse Brother    Drug abuse Brother    Early death Brother    Cystic kidney disease Other    Kidney failure Cousin    Sickle cell anemia Other    Stomach cancer Neg Hx    Pancreatic cancer Neg Hx    Esophageal cancer Neg Hx    Social History   Socioeconomic History   Marital status: Widowed    Spouse name: Not on file   Number of children: Not on file   Years of education: Not on file   Highest education level: Not on file  Occupational History   Not on file  Tobacco Use   Smoking status: Never   Smokeless tobacco: Never  Vaping Use   Vaping Use: Never used  Substance and Sexual  Activity   Alcohol use: No    Alcohol/week: 0.0 standard drinks of alcohol   Drug use: No   Sexual activity: Not Currently  Other Topics Concern   Not on file  Social History Narrative   Husband died 05/31/2017 [name]   4 kids (2 sons, 2 daughters)    Never smoker    Lived in [place] before coming to [place] moved 2014/2015    Retired 2011    [employer]       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 and son [name]    Social Determinants of Health   Financial Resource Strain: Low Risk  (11/10/2017)   Overall Financial Resource Strain (CARDIA)    Difficulty of Paying Living Expenses: Not hard at all  Food Insecurity: No Food Insecurity (11/10/2017)   Hunger Vital Sign    Worried About Running Out of Food in the Last Year: Never true    [place] in the Last Year: Never true  Transportation Needs: No Transportation Needs (11/10/2017)   PRAPARE - Hydrologist (Medical): No    Lack of Transportation (Non-Medical): No  Physical Activity: Not on file  Stress: No Stress Concern Present (11/10/2017)   [place]    Feeling of Stress : Not at all   Social Connections: Not on file  Intimate Partner Violence: Not on file   No outpatient medications have been marked as taking for the 01/22/22 encounter (Office Visit) with [name], Nino Glow, MD.   Allergies  Allergen Reactions   Citrus Itching and Swelling   Pineapple     rash   No results found for this or any previous visit (from the past 2160 hour(s)). Objective  Body mass index is 25.12 kg/m. Wt Readings from Last 3 Encounters:  01/22/22 141 lb 12.8 oz (64.3 kg)  12/20/21 140 lb 6.4 oz (63.7 kg)  08/08/21 141 lb 12.8 oz (64.3 kg)   Temp Readings from Last 3 Encounters:  01/22/22 98.1 F (36.7 C) (Oral)  12/20/21 97.7 F (36.5 C) (Oral)  08/08/21 97.9 F (36.6 C) (Temporal)   BP Readings from Last 3 Encounters:  01/22/22 (!) 150/80  12/20/21 130/82  08/08/21 (!) 160/84   Pulse Readings from Last 3 Encounters:  01/22/22 62  12/20/21 (!) 57  08/08/21 60    Physical Exam Vitals and nursing note reviewed.  Constitutional:      Appearance: Normal appearance. She is well-developed and well-groomed.  HENT:     Head: Normocephalic and atraumatic.  Eyes:     Conjunctiva/sclera: Conjunctivae normal.     Pupils: Pupils are equal, round, and reactive to light.  Cardiovascular:     Rate and Rhythm: Normal rate and regular rhythm.     Heart sounds: Normal heart sounds. No murmur heard. Pulmonary:     Effort: Pulmonary effort is normal.     Breath sounds: Normal breath sounds.  Abdominal:     General: Abdomen is flat. Bowel sounds are normal.     Tenderness: There is no abdominal tenderness.  Musculoskeletal:        General: No tenderness.  Skin:    General: Skin is warm and dry.  Neurological:     General: No focal deficit present.     Mental Status: She is alert and oriented to person, place, and time. Mental status is at baseline.     Cranial Nerves: Cranial nerves 2-12 are intact.  Motor: Motor function is intact.     Coordination: Coordination  is intact.     Gait: Gait is intact.  Psychiatric:        Attention and Perception: Attention and perception normal.        Mood and Affect: Mood and affect normal.        Speech: Speech normal.        Behavior: Behavior normal. Behavior is cooperative.        Thought Content: Thought content normal.        Cognition and Memory: Cognition and memory normal.        Judgment: Judgment normal.     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 Joelyn Oms renal  Rec take coreg 6.25 mg bid she is noncompliant with meds and not taking any other meds I.e acei/lasix  Essential hypertension - Plan: carvedilol (COREG) 6.25 MG tablet, Ambulatory referral to Cardiology, Lipid panel, Comprehensive metabolic panel, DISCONTINUED: carvedilol (COREG) 6.25 MG tablet  Pre-operative clearance - Plan: Ambulatory referral to Cardiology  Hyperlipidemia, unspecified hyperlipidemia type - Plan: Ambulatory referral to Cardiology, Lipid panel, Comprehensive metabolic panel Declines statin   Prediabetes - Plan: Hemoglobin A1c  Anemia, likely to to CKD - Plan: CBC with Differential/Platelet  Estrogen deficiency - Plan: DG Bone Density    HM Declines flu shot but will consider  MMR immune rec hep b vaccine not immune disc'ed prev pt wants to think about it  Tdap had 10/19/13 Dr. Heath Gold Will disc shingrix in future covid 19 had 2/2 pfizer disc consider 3rd vaccine and will need 4th had covid 04/2021  Prevnar had 09/14/17  pna 23 10/19/13  Consider prevnar 20 09/15/22   Referred mammogram [facility] center 11/27/15 [facility] with b/l asymmetries -as of 09/27/20 declines   Last DEXA in [place] years ago -rec call to schedule dexa    Pap out of age window  Colonoscopy 01/03/13 colon polyps will disc with pt If wants repeat in 12/2017  -referred Dr. Allen Norris 09/27/20 I hope she goes to this prev referred years ago and did not go   -05/09/21  consider repeat colonoscopy get cards and renal clearance 1st pt will call back and let me know name of GI MD wants but advised with CKD not sure if will do colonsocopy   Never smoker    [facility] Venetia Maxon yet  Provider: Dr. [name] [name]-Internal Medicine

## 2022-01-22 NOTE — Patient Instructions (Addendum)
[employer] rosemary scalp oil (online or at [employer])   Consider covid 19 vaccine in fall 2023   Call back and let me know who you want to see as far as GI/stomach doctor for colonoscopy  Dr. Volanda Napoleon or Dr. Nicki Reaper you can try to get to be new PCP    Call and schedule mammogram and bone density please   Cardiology address  [facility]   Phone Fax E-mail Address  [phone] Not available [phone] [phone]     Specialties     Cardiology            Anemia  Anemia is a condition in which there are not enough red blood cells or hemoglobin in the blood. Hemoglobin is a substance in red blood cells that carries oxygen. When you do not have enough red blood cells or hemoglobin (are anemic), your body cannot get enough oxygen, and your organs may not work properly. As a result, you may feel very tired or have other problems. What are the causes? Common causes of anemia include: Excessive bleeding. Anemia can be caused by excessive bleeding inside or outside the body, including bleeding from the intestines or from heavy menstrual periods in females. Poor nutrition. Long-lasting (chronic) kidney, thyroid, and liver disease. Bone marrow disorders, spleen problems, and blood disorders. Cancer and treatments for cancer. Human immunodeficiency virus (HIV) and acquired immunodeficiency syndrome (AIDS). Infections, medicines, and autoimmune disorders that destroy red blood cells. What are the signs or symptoms? Symptoms of this condition include: Minor weakness. Dizziness. Headache, or difficulties concentrating and sleeping. Heartbeats that feel irregular or faster than normal (palpitations). Shortness of breath, especially with exercise. Pale skin, lips, and nails, or cold hands and feet. Upset stomach (indigestion) and nausea. Symptoms may occur suddenly or develop slowly. If your anemia is mild, you may not have symptoms. How is this  diagnosed? This condition is diagnosed based on blood tests, your medical history, and a physical exam. In some cases, a test may be needed in which cells are removed from the soft tissue inside of a bone and looked at under a microscope (bone marrow biopsy). Your health care provider may also check your stool (feces) for blood and may do more testing to look for the cause of your bleeding. Other tests may include: Imaging tests, such as a CT scan or MRI. A procedure to see inside your esophagus and stomach (endoscopy). The esophagus is the part of the body that moves food from your mouth to your stomach. A procedure to see inside your colon and rectum (colonoscopy). How is this treated? Treatment for this condition depends on the cause. If you continue to lose a lot of blood, you may need to be treated at a hospital. Treatment may include: Taking supplements of iron, vitamin H20, or folic acid. Taking a hormone medicine (erythropoietin) that can help to stimulate red blood cell growth. Receiving donated blood through an IV (blood transfusion). This may be needed if you lose a lot of blood. Making changes to your diet. Having surgery to remove your spleen. Follow these instructions at home: Take over-the-counter and prescription medicines only as told by your health care provider. Take supplements only as told by your health care provider. Follow any diet instructions that you were given by your health care provider. Keep all follow-up visits. Your health care provider will want to recheck your blood tests. Contact a health care provider if:  You develop new bleeding anywhere in the body. You are very weak. Get help right away if: You are short of breath. You have pain in your abdomen or chest. You are dizzy or feel faint. You have trouble concentrating. You have bloody stools, black stools, or tarry stools. You vomit repeatedly or you vomit up blood. These symptoms may be an emergency.  Get help right away. Call 911. Do not wait to see if the symptoms will go away. Do not drive yourself to the hospital. Summary Anemia is a condition in which you do not have enough red blood cells or enough of a substance in your red blood cells that carries oxygen. Symptoms may occur suddenly or develop slowly. If your anemia is mild, you may not have symptoms. This condition is diagnosed with blood tests, a medical history, and a physical exam. Other tests may be needed. Treatment for this condition depends on the cause of the anemia. This information is not intended to replace advice given to you by your health care provider. Make sure you discuss any questions you have with your health care provider. Document Revised: 08/12/2021 Document Reviewed: 08/12/2021 Elsevier Patient Education  [place].

## 2022-01-24 ENCOUNTER — Telehealth: Payer: Self-pay

## 2022-01-24 NOTE — Telephone Encounter (Signed)
Spoke with pts son in regards to labs. Pts son verbalized understanding and said he will pass along the information to pt.   [name], Nino Glow, MD  Gracy Racer, CMA Her potassium is high and kidney function creatinine worse  Pt declines starting dialysis does she still feel like this it was recommended by kidney doctor Dr. Joelyn Oms?  Cholesterol still elevated previously declined statin  Her white blood cell count and blood counts slightly low this is chronic it is likely due to chronic kidney disease  A1c improved no prediabetes

## 2022-02-17 ENCOUNTER — Encounter: Payer: Self-pay | Admitting: *Deleted

## 2022-05-09 ENCOUNTER — Other Ambulatory Visit: Payer: Self-pay | Admitting: Internal Medicine

## 2022-05-09 DIAGNOSIS — Z1231 Encounter for screening mammogram for malignant neoplasm of breast: Secondary | ICD-10-CM

## 2022-07-14 ENCOUNTER — Ambulatory Visit: Payer: Medicare Other

## 2022-07-14 ENCOUNTER — Inpatient Hospital Stay: Admission: RE | Admit: 2022-07-14 | Payer: Medicare Other | Source: Ambulatory Visit

## 2022-12-03 DIAGNOSIS — F0280 Dementia in other diseases classified elsewhere without behavioral disturbance: Secondary | ICD-10-CM | POA: Diagnosis not present

## 2022-12-03 DIAGNOSIS — Z8679 Personal history of other diseases of the circulatory system: Secondary | ICD-10-CM | POA: Diagnosis not present

## 2022-12-03 DIAGNOSIS — G3183 Dementia with Lewy bodies: Secondary | ICD-10-CM | POA: Diagnosis not present

## 2022-12-03 DIAGNOSIS — N184 Chronic kidney disease, stage 4 (severe): Secondary | ICD-10-CM | POA: Diagnosis not present

## 2022-12-03 DIAGNOSIS — R413 Other amnesia: Secondary | ICD-10-CM | POA: Diagnosis not present

## 2022-12-05 DIAGNOSIS — R413 Other amnesia: Secondary | ICD-10-CM | POA: Diagnosis not present

## 2023-04-01 DIAGNOSIS — I10 Essential (primary) hypertension: Secondary | ICD-10-CM | POA: Diagnosis not present

## 2023-04-01 DIAGNOSIS — Z91148 Patient's other noncompliance with medication regimen for other reason: Secondary | ICD-10-CM | POA: Diagnosis not present

## 2023-04-01 DIAGNOSIS — Z23 Encounter for immunization: Secondary | ICD-10-CM | POA: Diagnosis not present

## 2023-04-01 DIAGNOSIS — F0390 Unspecified dementia without behavioral disturbance: Secondary | ICD-10-CM | POA: Diagnosis not present

## 2023-04-01 DIAGNOSIS — N186 End stage renal disease: Secondary | ICD-10-CM | POA: Diagnosis not present

## 2023-04-01 DIAGNOSIS — N184 Chronic kidney disease, stage 4 (severe): Secondary | ICD-10-CM | POA: Diagnosis not present

## 2023-04-01 DIAGNOSIS — I120 Hypertensive chronic kidney disease with stage 5 chronic kidney disease or end stage renal disease: Secondary | ICD-10-CM | POA: Diagnosis not present

## 2023-08-20 ENCOUNTER — Other Ambulatory Visit: Payer: Self-pay

## 2023-08-20 ENCOUNTER — Encounter: Payer: Self-pay | Admitting: Intensive Care

## 2023-08-20 ENCOUNTER — Inpatient Hospital Stay

## 2023-08-20 ENCOUNTER — Observation Stay
Admission: EM | Admit: 2023-08-20 | Discharge: 2023-08-21 | Disposition: A | Discharge status: Home / Self Care | Attending: Internal Medicine | Admitting: Internal Medicine

## 2023-08-20 ENCOUNTER — Emergency Department

## 2023-08-20 DIAGNOSIS — I120 Hypertensive chronic kidney disease with stage 5 chronic kidney disease or end stage renal disease: Secondary | ICD-10-CM | POA: Diagnosis not present

## 2023-08-20 DIAGNOSIS — J449 Chronic obstructive pulmonary disease, unspecified: Secondary | ICD-10-CM | POA: Insufficient documentation

## 2023-08-20 DIAGNOSIS — Z905 Acquired absence of kidney: Secondary | ICD-10-CM | POA: Insufficient documentation

## 2023-08-20 DIAGNOSIS — M25551 Pain in right hip: Secondary | ICD-10-CM | POA: Diagnosis not present

## 2023-08-20 DIAGNOSIS — N185 Chronic kidney disease, stage 5: Principal | ICD-10-CM

## 2023-08-20 DIAGNOSIS — E785 Hyperlipidemia, unspecified: Secondary | ICD-10-CM | POA: Diagnosis not present

## 2023-08-20 DIAGNOSIS — Z85528 Personal history of other malignant neoplasm of kidney: Secondary | ICD-10-CM | POA: Diagnosis not present

## 2023-08-20 DIAGNOSIS — R601 Generalized edema: Principal | ICD-10-CM | POA: Diagnosis present

## 2023-08-20 DIAGNOSIS — N179 Acute kidney failure, unspecified: Secondary | ICD-10-CM | POA: Diagnosis not present

## 2023-08-20 DIAGNOSIS — Z79899 Other long term (current) drug therapy: Secondary | ICD-10-CM | POA: Diagnosis not present

## 2023-08-20 DIAGNOSIS — H409 Unspecified glaucoma: Secondary | ICD-10-CM | POA: Diagnosis not present

## 2023-08-20 DIAGNOSIS — Z8616 Personal history of COVID-19: Secondary | ICD-10-CM | POA: Insufficient documentation

## 2023-08-20 DIAGNOSIS — D631 Anemia in chronic kidney disease: Secondary | ICD-10-CM | POA: Insufficient documentation

## 2023-08-20 DIAGNOSIS — F0390 Unspecified dementia without behavioral disturbance: Secondary | ICD-10-CM | POA: Diagnosis not present

## 2023-08-20 DIAGNOSIS — E039 Hypothyroidism, unspecified: Secondary | ICD-10-CM | POA: Diagnosis not present

## 2023-08-20 DIAGNOSIS — E872 Acidosis, unspecified: Secondary | ICD-10-CM | POA: Diagnosis not present

## 2023-08-20 DIAGNOSIS — N186 End stage renal disease: Secondary | ICD-10-CM | POA: Diagnosis not present

## 2023-08-20 DIAGNOSIS — R2240 Localized swelling, mass and lump, unspecified lower limb: Secondary | ICD-10-CM | POA: Diagnosis present

## 2023-08-20 DIAGNOSIS — M7989 Other specified soft tissue disorders: Secondary | ICD-10-CM

## 2023-08-20 LAB — URINALYSIS, COMPLETE (UACMP) WITH MICROSCOPIC
Bilirubin Urine: NEGATIVE
Glucose, UA: NEGATIVE mg/dL
Ketones, ur: NEGATIVE mg/dL
Leukocytes,Ua: NEGATIVE
Nitrite: NEGATIVE
Protein, ur: 30 mg/dL — AB
RBC / HPF: 0 RBC/hpf (ref 0–5)
Specific Gravity, Urine: 1.006 (ref 1.005–1.030)
pH: 6 (ref 5.0–8.0)

## 2023-08-20 LAB — CBC
HCT: 26.9 % — ABNORMAL LOW (ref 36.0–46.0)
Hemoglobin: 8.6 g/dL — ABNORMAL LOW (ref 12.0–15.0)
MCH: 30 pg (ref 26.0–34.0)
MCHC: 32 g/dL (ref 30.0–36.0)
MCV: 93.7 fL (ref 80.0–100.0)
Platelets: 162 K/uL (ref 150–400)
RBC: 2.87 MIL/uL — ABNORMAL LOW (ref 3.87–5.11)
RDW: 14.3 % (ref 11.5–15.5)
WBC: 2.4 K/uL — ABNORMAL LOW (ref 4.0–10.5)
nRBC: 0 % (ref 0.0–0.2)

## 2023-08-20 LAB — BASIC METABOLIC PANEL
Anion gap: 11 (ref 5–15)
BUN: 107 mg/dL — ABNORMAL HIGH (ref 8–23)
CO2: 15 mmol/L — ABNORMAL LOW (ref 22–32)
Calcium: 6.8 mg/dL — ABNORMAL LOW (ref 8.9–10.3)
Chloride: 112 mmol/L — ABNORMAL HIGH (ref 98–111)
Creatinine, Ser: 10 mg/dL — ABNORMAL HIGH (ref 0.44–1.00)
GFR, Estimated: 4 mL/min — ABNORMAL LOW (ref >60)
Glucose, Bld: 75 mg/dL (ref 70–99)
Potassium: 4.4 mmol/L (ref 3.5–5.1)
Sodium: 138 mmol/L (ref 135–145)

## 2023-08-20 LAB — BRAIN NATRIURETIC PEPTIDE: B Natriuretic Peptide: >4500 pg/mL — ABNORMAL HIGH (ref 0.0–100.0)

## 2023-08-20 LAB — HEMOGLOBIN A1C
Hgb A1c MFr Bld: 4.8 % (ref 4.8–5.6)
Mean Plasma Glucose: 91.06 mg/dL

## 2023-08-20 LAB — TROPONIN I (HIGH SENSITIVITY): Troponin I (High Sensitivity): 56 ng/L — ABNORMAL HIGH (ref <18)

## 2023-08-20 LAB — TSH: TSH: 2.237 u[IU]/mL (ref 0.350–4.500)

## 2023-08-20 MED ORDER — CARVEDILOL 3.125 MG PO TABS
6.2500 mg | ORAL_TABLET | Freq: Two times a day (BID) | ORAL | Status: DC
  Administered 2023-08-20 – 2023-08-21 (×2): 6.25 mg via ORAL
  Filled 2023-08-20 (×2): qty 2

## 2023-08-20 MED ORDER — FUROSEMIDE 10 MG/ML IJ SOLN
5.0000 mg/h | INTRAVENOUS | Status: DC
  Administered 2023-08-20: 5 mg/h via INTRAVENOUS
  Filled 2023-08-20: qty 20

## 2023-08-20 MED ORDER — SODIUM BICARBONATE 650 MG PO TABS
1300.0000 mg | ORAL_TABLET | Freq: Two times a day (BID) | ORAL | Status: DC
  Administered 2023-08-20 – 2023-08-21 (×2): 1300 mg via ORAL
  Filled 2023-08-20 (×2): qty 2

## 2023-08-20 MED ORDER — OXYCODONE HCL 5 MG PO TABS: 5.0000 mg | ORAL_TABLET | ORAL | Status: DC | PRN

## 2023-08-20 MED ORDER — ACETAMINOPHEN 325 MG PO TABS: 650.0000 mg | ORAL_TABLET | Freq: Four times a day (QID) | ORAL | Status: DC | PRN

## 2023-08-20 MED ORDER — ONDANSETRON HCL 4 MG PO TABS: 4.0000 mg | ORAL_TABLET | Freq: Four times a day (QID) | ORAL | Status: DC | PRN

## 2023-08-20 MED ORDER — SODIUM BICARBONATE 650 MG PO TABS
650.0000 mg | ORAL_TABLET | Freq: Three times a day (TID) | ORAL | Status: DC
  Administered 2023-08-20: 650 mg via ORAL
  Filled 2023-08-20 (×2): qty 1

## 2023-08-20 MED ORDER — ACETAMINOPHEN 650 MG RE SUPP: 650.0000 mg | Freq: Four times a day (QID) | RECTAL | Status: DC | PRN

## 2023-08-20 MED ORDER — ALBUTEROL SULFATE (2.5 MG/3ML) 0.083% IN NEBU: 2.5000 mg | INHALATION_SOLUTION | RESPIRATORY_TRACT | Status: DC | PRN

## 2023-08-20 MED ORDER — FUROSEMIDE 10 MG/ML IJ SOLN
80.0000 mg | Freq: Once | INTRAMUSCULAR | Status: AC
  Administered 2023-08-20: 80 mg via INTRAVENOUS
  Filled 2023-08-20: qty 8

## 2023-08-20 MED ORDER — HYDRALAZINE HCL 20 MG/ML IJ SOLN: 10.0000 mg | Freq: Four times a day (QID) | INTRAMUSCULAR | Status: DC | PRN

## 2023-08-20 MED ORDER — HEPARIN SODIUM (PORCINE) 5000 UNIT/ML IJ SOLN
5000.0000 [IU] | Freq: Three times a day (TID) | INTRAMUSCULAR | Status: DC
  Administered 2023-08-20 – 2023-08-21 (×3): 5000 [IU] via SUBCUTANEOUS
  Filled 2023-08-20 (×3): qty 1

## 2023-08-20 MED ORDER — ONDANSETRON HCL 4 MG/2ML IJ SOLN: 4.0000 mg | Freq: Four times a day (QID) | INTRAMUSCULAR | Status: DC | PRN

## 2023-08-20 NOTE — Progress Notes (Signed)
 OT Cancellation Note  Patient Details Name: TAYANA SHANKLE MRN: [id number] DOB: 27-Apr-1946   Cancelled Treatment:    Reason Eval/Treat Not Completed: Patient at procedure or test/ unavailable. Pt OTF, unavailable for OT evaluation. Will re-attempt next date.   Arman Filter., MPH, MS, OTR/L ascom [phone] 08/20/23, 3:26 PM

## 2023-08-20 NOTE — H&P (Signed)
 History and Physical    Holly Mcgee KYH:[id number] DOB: 12/09/1945 DOA: 08/20/2023  PCP: [name], MD  Patient coming from: Home  I have personally briefly reviewed patient's old medical records in [facility] Health Link  Chief Complaint: Leg swelling  HPI: Holly Mcgee is a 78 y.o. female with medical history significant of hypertension, glaucoma who presents to the ER for evaluation of bilateral lower extremity swelling that she states has been increasing in severity for the past several days.  Also associated with pain worse on the right side with decreased ability to ambulate.  On arrival vital signs overall stable save for some hypertension.  Laboratory investigation revealed markedly abnormal kidney function with BUN 107 and creatinine 10.  Patient states that she does make a small amount of urine.  On review of the medical record the patient had been evaluated by nephrology in the past and there was at least a referral to pretransplant evaluation that have been initiated.  I have discussed case with nephrology who agreed to consult.  I have also discussed the possibility of initiating hemodialysis with the patient and her son at bedside  ED Course: EDP spoke to nephrology regarding the findings.  Since patient makes a small amount of urine 80 mg IV Lasix was ordered.  Hospitalist contacted for admission.  Review of Systems: As per HPI otherwise 14 point review of systems negative.    Past Medical History:  Diagnosis Date   Abnormal Pap smear of cervix    mid 104s to 74s    Allergy    Anemia    Brittle nails    Chicken pox    Chronic kidney disease    CKD 4/borderline 5    CKD (chronic kidney disease)    4/5 due to PKD   COVID-19    late 04/2021 s/p MAB   Dementia ([facility])    Emphysema of lung ([facility])    chronic bronchitis    Fibrocystic breast changes    Glaucoma    Dr. Chalmers Guest [name]   Heart murmur    Hyperlipidemia    Hypertension    Measles     childhood   Secondary hyperparathyroidism ([facility])    Shoulder pain    Sleep apnea    Thyroid disease    hypothyroidism     Past Surgical History:  Procedure Laterality Date   ABDOMINAL HYSTERECTOMY     total 2/2 fibroids in [place]   KIDNEY CYST REMOVAL  12/2012   Performed [facility]   UTERINE FIBROID SURGERY     In [place] several years ago   VARICOSE VEIN SURGERY     left with h/o abnormal abi    VEIN LIGATION AND STRIPPING Left    Performed in [place]     reports that she has never smoked. She has never used smokeless tobacco. She reports that she does not drink alcohol and does not use drugs.  Allergies  Allergen Reactions   Citrus Itching and Swelling   Pineapple     rash    Family History  Problem Relation Age of Onset   Hypertension Mother    Diabetes Mother    Arthritis Mother    Hypertension Father    Stroke Father        complications from HTN   Prostate cancer Father    Alcohol abuse Brother    COPD Brother    Depression Brother    Drug abuse Brother  Early death Brother    Mental illness Daughter    Diabetes Son    Alcohol abuse Brother    Drug abuse Brother    Early death Brother    Cystic kidney disease Other    Kidney failure Cousin    Sickle cell anemia Other    Stomach cancer Neg Hx    Pancreatic cancer Neg Hx    Esophageal cancer Neg Hx      Prior to Admission medications   Medication Sig Start Date End Date Taking? Authorizing Provider  carvedilol (COREG) 6.25 MG tablet Take 1 tablet (6.25 mg total) by mouth 2 (two) times daily with a meal. 01/22/22   [name], Pasty Spillers, MD  cholecalciferol (VITAMIN D3) 25 MCG (1000 UNIT) tablet Take 1,000 Units by mouth daily. Patient not taking: Reported on 01/22/2022    [provider]  dorzolamide-timolol (COSOPT) 22.3-6.8 MG/ML ophthalmic solution Place 1 drop into both eyes every morning. Patient not taking: Reported on 01/22/2022    [provider]  furosemide (LASIX) 40 MG  tablet Take 1 tablet (40 mg total) by mouth daily as needed. Patient not taking: Reported on 08/08/2021 09/27/20   [name], Pasty Spillers, MD  hydrocortisone 2.5 % ointment Apply topically 2 (two) times daily. To neck and face rash Patient not taking: Reported on 01/22/2022 12/20/21   [name], Pasty Spillers, MD  Latanoprostene Bunod (VYZULTA) 0.024 % SOLN Apply 1 drop to eye at bedtime. Both eyes per Dr. Chalmers Guest Patient not taking: Reported on 01/22/2022 06/01/17   [name], Pasty Spillers, MD  lisinopril (ZESTRIL) 40 MG tablet Take 40 mg by mouth daily. Patient not taking: Reported on 05/09/2021 03/20/20 03/20/21  [provider]  Multiple Vitamin (MULTIVITAMIN WITH MINERALS) TABS tablet Take 1 tablet by mouth daily with lunch. Patient not taking: Reported on 01/22/2022    [provider]  rivastigmine (EXELON) 1.5 MG capsule Take 1.5 mg by mouth 2 (two) times daily. Patient not taking: Reported on 02/24/2020 05/19/19 05/18/20  [provider]    Physical Exam: Vitals:   08/20/23 0858 08/20/23 0906 08/20/23 0909  BP: (!) 163/88    Pulse: 86    Resp:   15  Temp: 98.4 F (36.9 C)    TempSrc: Oral    SpO2: 97%    Weight:  65.3 kg   Height:  5\' 2"  (1.575 m)    General: No apparent distress, patient appears well HEENT: Normocephalic, atraumatic Neck, supple, trachea midline, no tenderness Heart: Regular rate and rhythm, S1/S2 normal, no murmurs, tense pitting edema bilateral lower extremities Lungs: Diminished to bases.  Normal work of breathing.  Room air Abdomen: Soft, nontender, nondistended, positive bowel sounds Extremities: Normal, atraumatic, no clubbing or cyanosis, normal muscle tone Skin: No rashes or lesions, normal color Neurologic: Cranial nerves grossly intact, sensation intact, alert and oriented x3 Psychiatric: Normal affect,  Labs on Admission: I have personally reviewed following labs and imaging studies  CBC: Recent Labs  Lab  08/20/23 0909  WBC 2.4*  HGB 8.6*  HCT 26.9*  MCV 93.7  PLT 162   Basic Metabolic Panel: Recent Labs  Lab 08/20/23 0909  NA 138  K 4.4  CL 112*  CO2 15*  GLUCOSE 75  BUN 107*  CREATININE 10.00*  CALCIUM 6.8*   GFR: Estimated Creatinine Clearance: 4.2 mL/min (A) (by C-G formula based on SCr of 10 mg/dL (H)). Liver Function Tests: No results for input(s): "AST", "ALT", "ALKPHOS", "BILITOT", "PROT", "ALBUMIN" in the last 168  hours. No results for input(s): "LIPASE", "AMYLASE" in the last 168 hours. No results for input(s): "AMMONIA" in the last 168 hours. Coagulation Profile: No results for input(s): "INR", "PROTIME" in the last 168 hours. Cardiac Enzymes: No results for input(s): "CKTOTAL", "CKMB", "CKMBINDEX", "TROPONINI" in the last 168 hours. BNP (last 3 results) No results for input(s): "PROBNP" in the last 8760 hours. HbA1C: No results for input(s): "HGBA1C" in the last 72 hours. CBG: No results for input(s): "GLUCAP" in the last 168 hours. Lipid Profile: No results for input(s): "CHOL", "HDL", "LDLCALC", "TRIG", "CHOLHDL", "LDLDIRECT" in the last 72 hours. Thyroid Function Tests: No results for input(s): "TSH", "T4TOTAL", "FREET4", "T3FREE", "THYROIDAB" in the last 72 hours. Anemia Panel: No results for input(s): "VITAMINB12", "FOLATE", "FERRITIN", "TIBC", "IRON", "RETICCTPCT" in the last 72 hours. Urine analysis:    Component Value Date/Time   COLORURINE YELLOW 03/18/2019 1937   APPEARANCEUR CLEAR 03/18/2019 1937   LABSPEC 1.009 03/18/2019 1937   PHURINE 5.0 03/18/2019 1937   GLUCOSEU NEGATIVE 03/18/2019 1937   GLUCOSEU NEGATIVE 05/22/2017 1105   HGBUR SMALL (A) 03/18/2019 1937   BILIRUBINUR NEGATIVE 03/18/2019 1937   BILIRUBINUR neg 03/14/2015 1346   KETONESUR NEGATIVE 03/18/2019 1937   PROTEINUR 30 (A) 03/18/2019 1937   UROBILINOGEN 0.2 05/22/2017 1105   NITRITE NEGATIVE 03/18/2019 1937   LEUKOCYTESUR NEGATIVE 03/18/2019 1937    Radiological Exams  on Admission: No results found.  EKG: Independently reviewed.  Sinus rhythm  Assessment/Plan Principal Problem:   Anasarca  Anasarca/fluid overload Uremia AKI, likely progressing to ESRD Metabolic acidosis Patient presented with BUN 107 and creatinine 10.  States she still produces a small amount of urine.  On chart review has been previously referred to transplant nephrology in the past.  Follow-up is unclear.  I alerted the patient to the possibility of initiating hemodialysis during this admission.  She and her son at bedside seem agreeable.. Plan: Admit inpatient Nephrology consult, case discussed with Dr. Cherylann Ratel Sodium bicarb 650 mg p.o. 3 times daily Daily renal labs Trial IV Lasix 80 mg x 1 Check UA if able  Essential hypertension Home Coreg 6.25 mg twice daily As needed IV hydralazine  Glaucoma Restart home gtt.  Right leg/hip pain Plain film x-ray right hip, pelvis, right femur  DVT prophylaxis: SQ heparin Code Status: Full code Family Communication: Son at bedside 3/20 Disposition Plan: Anticipate return previous home environment Consults called: Nephrology-Dr. Cherylann Ratel Admission status: Inpatient, MedSurg   Tresa Moore MD [facility]   If 7PM-7AM, please contact night-coverage No  08/20/2023, 12:03 PM

## 2023-08-20 NOTE — ED Triage Notes (Signed)
 Patient presents with bilateral leg swelling and pain X2 weeks that has progressively gotten worse. Also reports weakness and difficulty walking.   Son reports multiple instances of blood vessels in eyes bursting   History dementia and stage 4 kidney disease.

## 2023-08-20 NOTE — Consult Note (Signed)
 [facility] CONSULT NOTE    Date: 08/20/2023                  Patient Name:  Holly Mcgee  MRN: [id number]  DOB: 07-08-1945  Age / Sex: 78 y.o., female         PCP: [name], MD                 Service Requesting Consult: Hospitalist                 Reason for Consult: Evaluation management of untreated ESRD            History of Present Illness: Patient is a 78 y.o. female with a PMHx of left-sided renal cell carcinoma status post partial left nephrectomy, cystic right kidney, dementia, COPD, glaucoma, hypertension, hyperlipidemia, secondary hyperparathyroidism, anemia of chronic kidney disease, hypothyroidism, who was admitted to [facility] on 08/20/2023 for evaluation of worsening lower extremity edema.  Apparently this has been increasing over the past several weeks.  Patient has known advanced chronic kidney disease.  eGFR now quite low at 4.  Prior to this on 04/01/2023 BUN was 110 with a creatinine of 9.4 and EGFR of 4.  Therefore it appears that these findings are not acute.  Patient has been offered renal placement therapy in the past but is declined.  She was also evaluated for renal transplant back in 2018 but was felt to be a somewhat high risk candidate..  Patient also endorses some element of dysgeusia.  She also has significant anemia with a hemoglobin of 8.6.  BNP was quite elevated at greater than 4500.  We had a long discussion about the potential for renal placement therapy.  She is considering it but not open to starting this immediately.  She also stated that she would like a second opinion regarding timing of initiation of renal placement therapy.   Medications: Outpatient medications: Medications Prior to Admission  Medication Sig Dispense Refill Last Dose/Taking   carvedilol (COREG) 6.25 MG tablet Take 1 tablet (6.25 mg total) by mouth 2 (two) times daily with a meal. (Patient not taking: Reported on 08/20/2023) 180 tablet 3 Not Taking    cholecalciferol (VITAMIN D3) 25 MCG (1000 UNIT) tablet Take 1,000 Units by mouth daily. (Patient not taking: Reported on 01/22/2022)      dorzolamide-timolol (COSOPT) 22.3-6.8 MG/ML ophthalmic solution Place 1 drop into both eyes every morning. (Patient not taking: Reported on 01/22/2022)      furosemide (LASIX) 40 MG tablet Take 1 tablet (40 mg total) by mouth daily as needed. (Patient not taking: Reported on 08/08/2021) 90 tablet 3    hydrocortisone 2.5 % ointment Apply topically 2 (two) times daily. To neck and face rash (Patient not taking: Reported on 01/22/2022) 454 g 0    Latanoprostene Bunod (VYZULTA) 0.024 % SOLN Apply 1 drop to eye at bedtime. Both eyes per Dr. Chalmers Guest (Patient not taking: Reported on 01/22/2022) 1 Bottle 0    lisinopril (ZESTRIL) 40 MG tablet Take 40 mg by mouth daily. (Patient not taking: Reported on 05/09/2021)      Multiple Vitamin (MULTIVITAMIN WITH MINERALS) TABS tablet Take 1 tablet by mouth daily with lunch. (Patient not taking: Reported on 01/22/2022)      rivastigmine (EXELON) 1.5 MG capsule Take 1.5 mg by mouth 2 (two) times daily. (Patient not taking: Reported on 02/24/2020)       Current medications: Current Facility-Administered Medications  Medication Dose Route  Frequency Provider Last Rate Last Admin   acetaminophen (TYLENOL) tablet 650 mg  650 mg Oral Q6H PRN Lolita Patella B, MD       Or   acetaminophen (TYLENOL) suppository 650 mg  650 mg Rectal Q6H PRN Sreenath, Sudheer B, MD       albuterol (PROVENTIL) (2.5 MG/3ML) 0.083% nebulizer solution 2.5 mg  2.5 mg Nebulization Q2H PRN Georgeann Oppenheim, Sudheer B, MD       carvedilol (COREG) tablet 6.25 mg  6.25 mg Oral BID WC Sreenath, Sudheer B, MD   6.25 mg at 08/20/23 1604   furosemide (LASIX) 200 mg in dextrose 5 % 100 mL (2 mg/mL) infusion  5 mg/hr Intravenous Continuous Wendee Beavers, NP 2.5 mL/hr at 08/20/23 1548 5 mg/hr at 08/20/23 1548   heparin injection 5,000 Units  5,000 Units Subcutaneous Q8H  Sreenath, Sudheer B, MD   5,000 Units at 08/20/23 1348   hydrALAZINE (APRESOLINE) injection 10 mg  10 mg Intravenous Q6H PRN Lolita Patella B, MD       ondansetron (ZOFRAN) tablet 4 mg  4 mg Oral Q6H PRN Sreenath, Sudheer B, MD       Or   ondansetron (ZOFRAN) injection 4 mg  4 mg Intravenous Q6H PRN Sreenath, Sudheer B, MD       oxyCODONE (Oxy IR/ROXICODONE) immediate release tablet 5-10 mg  5-10 mg Oral Q4H PRN Sreenath, Sudheer B, MD       sodium bicarbonate tablet 650 mg  650 mg Oral TID Lolita Patella B, MD   650 mg at 08/20/23 1551      Allergies: Allergies  Allergen Reactions   Citrus Itching and Swelling   Pineapple     rash      Past Medical History: Past Medical History:  Diagnosis Date   Abnormal Pap smear of cervix    mid 40s to 50s    Allergy    Anemia    Brittle nails    Chicken pox    Chronic kidney disease    CKD 4/borderline 5    CKD (chronic kidney disease)    4/5 due to PKD   COVID-19    late 04/2021 s/p MAB   Dementia (HCC)    Emphysema of lung (HCC)    chronic bronchitis    Fibrocystic breast changes    Glaucoma    Dr. Chalmers Guest [name]   Heart murmur    Hyperlipidemia    Hypertension    Measles    childhood   Secondary hyperparathyroidism (HCC)    Shoulder pain    Sleep apnea    Thyroid disease    hypothyroidism      Past Surgical History: Past Surgical History:  Procedure Laterality Date   ABDOMINAL HYSTERECTOMY     total 2/2 fibroids in [place]   KIDNEY CYST REMOVAL  12/2012   Performed at [facility]   UTERINE FIBROID SURGERY     In [place] several years ago   VARICOSE VEIN SURGERY     left with h/o abnormal abi    VEIN LIGATION AND STRIPPING Left    Performed in [place]     Family History: Family History  Problem Relation Age of Onset   Hypertension Mother    Diabetes Mother    Arthritis Mother    Hypertension Father    Stroke Father        complications from HTN   Prostate cancer Father    Alcohol abuse Brother     COPD  Brother    Depression Brother    Drug abuse Brother    Early death Brother    Mental illness Daughter    Diabetes Son    Alcohol abuse Brother    Drug abuse Brother    Early death Brother    Cystic kidney disease Other    Kidney failure Cousin    Sickle cell anemia Other    Stomach cancer Neg Hx    Pancreatic cancer Neg Hx    Esophageal cancer Neg Hx      Social History: Social History   Socioeconomic History   Marital status: Widowed    Spouse name: Not on file   Number of children: Not on file   Years of education: Not on file   Highest education level: Not on file  Occupational History   Not on file  Tobacco Use   Smoking status: Never   Smokeless tobacco: Never  Vaping Use   Vaping status: Never Used  Substance and Sexual Activity   Alcohol use: No    Alcohol/week: 0.0 standard drinks of alcohol   Drug use: No   Sexual activity: Not Currently  Other Topics Concern   Not on file  Social History Narrative   Husband died May 07, 2017 Britt Bottom   4 kids (2 sons, 2 daughters)    Never smoker    Lived in [place] before coming to [place] moved 2014/2015    Retired 2011    [employer]       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 Tawni Levy and son Loraine Leriche    Social Drivers of Health   Financial Resource Strain: Low Risk  (04/01/2023)   Received from [facility]   Overall Financial Resource Strain (CARDIA)    Difficulty of Paying Living Expenses: Not hard at all  Food Insecurity: No Food Insecurity (08/20/2023)   Hunger Vital Sign    Worried About Running Out of Food in the Last Year: Never true    Ran Out of Food in the Last Year: Never true  Transportation Needs: No Transportation Needs (08/20/2023)   PRAPARE - Administrator, Civil Service (Medical): No    Lack of Transportation (Non-Medical): No  Physical Activity: Unknown (04/01/2023)   Received from [facility]   Exercise Vital Sign    Days of Exercise per Week: 0 days    Minutes of  Exercise per Session: Not on file  Stress: No Stress Concern Present (11/10/2017)   [employer] of Occupational Health - Occupational Stress Questionnaire    Feeling of Stress : Not at all  Social Connections: Moderately Isolated (08/20/2023)   Social Connection and Isolation Panel [NHANES]    Frequency of Communication with Friends and Family: More than three times a week    Frequency of Social Gatherings with Friends and Family: More than three times a week    Attends Religious Services: 1 to 4 times per year    Active Member of [employer] or Organizations: No    Attends Banker Meetings: Never    Marital Status: Widowed  Intimate Partner Violence: Not At Risk (08/20/2023)   Humiliation, Afraid, Rape, and Kick questionnaire    Fear of Current or Ex-Partner: No    Emotionally Abused: No    Physically Abused: No    Sexually Abused: No     Review of Systems: Review of Systems  Constitutional:  Positive for malaise/fatigue. Negative for chills and fever.  HENT:  Negative for congestion, hearing loss  and tinnitus.   Eyes:  Negative for blurred vision and double vision.  Respiratory:  Positive for shortness of breath. Negative for cough and sputum production.   Cardiovascular:  Positive for leg swelling. Negative for chest pain, palpitations and orthopnea.  Gastrointestinal:  Negative for diarrhea, nausea and vomiting.  Genitourinary:  Negative for dysuria, frequency and urgency.  Musculoskeletal:  Negative for myalgias.  Skin:  Negative for itching and rash.  Neurological:  Positive for weakness. Negative for dizziness and focal weakness.  Endo/Heme/Allergies:  Negative for polydipsia. Does not bruise/bleed easily.  Psychiatric/Behavioral:  The patient is not nervous/anxious.      Vital Signs: Blood pressure (!) 170/100, pulse 92, temperature 97.8 F (36.6 C), temperature source Oral, resp. rate 16, height 5\' 2"  (1.575 m), weight 69.1 kg, SpO2 90%.  Weight  trends: Filed Weights   08/20/23 0906 08/20/23 1349  Weight: 65.3 kg 69.1 kg     Physical Exam: General: No acute distress  Head: Normocephalic, atraumatic. Moist oral mucosal membranes  Eyes: Anicteric  Neck: Supple  Lungs:  Clear to auscultation, normal effort  Heart: S1S2 no rubs  Abdomen:  Soft, nontender, bowel sounds present  Extremities: 3+ peripheral edema.  Neurologic: Awake, alert, following commands  Skin: No acute rash  Access: No hemodialysis access    Lab results: Basic Metabolic Panel: Recent Labs  Lab 08/20/23 0909  NA 138  K 4.4  CL 112*  CO2 15*  GLUCOSE 75  BUN 107*  CREATININE 10.00*  CALCIUM 6.8*    Liver Function Tests: No results for input(s): "AST", "ALT", "ALKPHOS", "BILITOT", "PROT", "ALBUMIN" in the last 168 hours. No results for input(s): "LIPASE", "AMYLASE" in the last 168 hours. No results for input(s): "AMMONIA" in the last 168 hours.  CBC: Recent Labs  Lab 08/20/23 0909  WBC 2.4*  HGB 8.6*  HCT 26.9*  MCV 93.7  PLT 162    Cardiac Enzymes: No results for input(s): "CKTOTAL", "CKMB", "CKMBINDEX", "TROPONINI" in the last 168 hours.  BNP: Invalid input(s): "POCBNP"  CBG: No results for input(s): "GLUCAP" in the last 168 hours.  Microbiology: Results for orders placed or performed during the hospital encounter of 02/16/19  Urine Culture     Status: None   Collection Time: 02/16/19  9:33 AM   Specimen: Urine, Random  Result Value Ref Range Status   Specimen Description   Final    URINE, RANDOM Performed at [facility], [address], [place], [place] [address]    Special Requests   Final    NONE Performed at [facility], [address], [place], [place] [address]    Culture   Final    NO GROWTH Performed at [facility], [address] [place], [place] [address]    Report Status 02/17/2019 FINAL  Final  SARS CORONAVIRUS 2 (TAT 6-24 HRS) Nasopharyngeal Nasopharyngeal Swab     Status:  None   Collection Time: 02/16/19 11:05 AM   Specimen: Nasopharyngeal Swab  Result Value Ref Range Status   SARS Coronavirus 2 NEGATIVE NEGATIVE Final    Comment: (NOTE) SARS-CoV-2 target nucleic acids are NOT DETECTED. The SARS-CoV-2 RNA is generally detectable in upper and lower respiratory specimens during the acute phase of infection. Negative results do not preclude SARS-CoV-2 infection, do not rule out co-infections with other pathogens, and should not be used as the sole basis for treatment or other patient management decisions. Negative results must be combined with clinical observations, patient history, and epidemiological  information. The expected result is Negative. Fact Sheet for Patients: HairSlick.no Fact Sheet for Healthcare Providers: quierodirigir.com This test is not yet approved or cleared by the [place] FDA and  has been authorized for detection and/or diagnosis of SARS-CoV-2 by FDA under an Emergency Use Authorization (EUA). This EUA will remain  in effect (meaning this test can be used) for the duration of the COVID-19 declaration under Section 56 4(b)(1) of the Act, 21 U.S.C. section 360bbb-3(b)(1), unless the authorization is terminated or revoked sooner. Performed at [facility], [address], [place], [place] [address]     Coagulation Studies: No results for input(s): "LABPROT", "INR" in the last 72 hours.  Urinalysis: Recent Labs    08/20/23 1413  COLORURINE STRAW*  LABSPEC 1.006  PHURINE 6.0  GLUCOSEU NEGATIVE  HGBUR SMALL*  BILIRUBINUR NEGATIVE  KETONESUR NEGATIVE  PROTEINUR 30*  NITRITE NEGATIVE  LEUKOCYTESUR NEGATIVE      Imaging: US Venous Img Lower Bilateral Result Date: 08/20/2023 CLINICAL DATA:  Bilateral lower extremity edema. EXAM: Bilateral LOWER EXTREMITY VENOUS DOPPLER ULTRASOUND TECHNIQUE: Gray-scale sonography with compression, as well as color and duplex  ultrasound, were performed to evaluate the deep venous system(s) from the level of the common femoral vein through the popliteal and proximal calf veins. COMPARISON:  Right lower extremity ultrasound 10/17/2015 FINDINGS: VENOUS Normal compressibility of the common femoral, superficial femoral, and popliteal veins, as well as the visualized calf veins. Visualized portions of profunda femoral vein and great saphenous vein unremarkable. No filling defects to suggest DVT on grayscale or color Doppler imaging. Doppler waveforms show normal direction of venous flow, normal respiratory plasticity and response to augmentation. OTHER None. Limitations: none IMPRESSION: No evidence of bilateral lower extremity DVT Electronically Signed   By: Karen Kays M.D.   On: 08/20/2023 14:44   DG FEMUR, MIN 2 VIEWS RIGHT Result Date: 08/20/2023 CLINICAL DATA:  Leg swelling and pain.  No history of trauma EXAM: RIGHT FEMUR 2 VIEWS COMPARISON:  None Available. FINDINGS: Osteopenia. Slight joint space loss of the hip joint. Minimal osteophyte formation. Hyperostosis. Slight degenerative changes as well of the knee joint. Vascular calcifications. IMPRESSION: Osteopenia.  Mild degenerative changes. Electronically Signed   By: Karen Kays M.D.   On: 08/20/2023 12:57   DG HIP UNILAT WITH PELVIS 2-3 VIEWS RIGHT Result Date: 08/20/2023 CLINICAL DATA:  Worsening leg swelling and pain. Chronic kidney disease. EXAM: DG HIP (WITH OR WITHOUT PELVIS) 3V RIGHT COMPARISON:  None Available. FINDINGS: Osteopenia. No fracture or dislocation. Mild concentric joint space loss of the hips. There is some osteophytes seen and some joint space loss along the sacroiliac joints. No fracture or dislocation. Hyperostosis. Vascular calcifications. IMPRESSION: Moderate degenerative changes.  Osteopenia. Electronically Signed   By: Karen Kays M.D.   On: 08/20/2023 12:55     Assessment & Plan: Pt is a 78 y.o. female with a PMHx of left-sided renal cell  carcinoma status post partial left nephrectomy, cystic right kidney, dementia, COPD, glaucoma, hypertension, hyperlipidemia, secondary hyperparathyroidism, anemia of chronic kidney disease, hypothyroidism, who was admitted to [facility] on 08/20/2023 for evaluation of worsening lower extremity edema.  1.  ESRD with history of left partial nephrectomy and cystic right kidney.  Patient has had a longstanding history of renal insufficiency/volume overload.  Her EGFR is remained quite low recently.  Back in October her EGFR was 4 and it remains quite low at 4 at the moment.  We had a long discussion with the patient today in  the presence of her son and recommended initiation of renal replacement therapy.  As before she has declined.  She has requested second opinion regarding the need for renal placement therapy.  We offered her outpatient referrals to either [facility] or [facility].  Patient wishes to go to [facility] nephrology once she is discharged.  For now we will attempt to treat volume overload with Lasix drip.  2.  Anemia of chronic kidney disease.  Most recent hemoglobin was low at 8.6. Consider starting the patient on erythropoietin stimulating agents as an outpatient.  3.  Acute metabolic acidosis.  Serum bicarbonate currently 15.  Add sodium bicarbonate 1300 mg twice daily.

## 2023-08-20 NOTE — ED Provider Notes (Signed)
 [facility] Provider Note    Event Date/Time   First MD Initiated Contact with Patient 08/20/23 1104     (approximate)   History   No chief complaint on file.   HPI  Holly Mcgee is a 78 y.o. female with known CKD who comes in with concerns for worsening leg swelling.  Patient has a history of CKD not on dialysis given still good urine output.  However she has reported some increasing leg swelling to the point where now it is difficult for her to ambulate.  She denies any confusion although son does report some weakness.  She is open to getting dialysis. No chest pain, no SOB.   Physical Exam   Triage Vital Signs: ED Triage Vitals  Encounter Vitals Group     BP 08/20/23 0858 (!) 163/88     Systolic BP Percentile --      Diastolic BP Percentile --      Pulse Rate 08/20/23 0858 86     Resp 08/20/23 0909 15     Temp 08/20/23 0858 98.4 F (36.9 C)     Temp Source 08/20/23 0858 Oral     SpO2 08/20/23 0858 97 %     Weight 08/20/23 0906 144 lb (65.3 kg)     Height 08/20/23 0906 5\' 2"  (1.575 m)     Head Circumference --      Peak Flow --      Pain Score 08/20/23 0906 0     Pain Loc --      Pain Education --      Exclude from Growth Chart --     Most recent vital signs: Vitals:   08/20/23 0858 08/20/23 0909  BP: (!) 163/88   Pulse: 86   Resp:  15  Temp: 98.4 F (36.9 C)   SpO2: 97%      General: Awake, no distress.  CV:  Good peripheral perfusion.  Resp:  Normal effort.  Abd:  No distention.  Other:  2+ edema bilaterally   ED Results / Procedures / Treatments   Labs (all labs ordered are listed, but only abnormal results are displayed) Labs Reviewed  BASIC METABOLIC PANEL - Abnormal; Notable for the following components:      Result Value   Chloride 112 (*)    CO2 15 (*)    BUN 107 (*)    Creatinine, Ser 10.00 (*)    Calcium 6.8 (*)    GFR, Estimated 4 (*)    All other components within normal limits  CBC - Abnormal;  Notable for the following components:   WBC 2.4 (*)    RBC 2.87 (*)    Hemoglobin 8.6 (*)    HCT 26.9 (*)    All other components within normal limits  URINALYSIS, ROUTINE W REFLEX MICROSCOPIC     EKG  My interpretation of EKG:  Normal sinus rate of 85 without any ST elevation or T wave inversions, normal intervals  RADIOLOGY Pending   PROCEDURES:  Critical Care performed: No  Procedures   MEDICATIONS ORDERED IN ED: Medications  carvedilol (COREG) tablet 6.25 mg (has no administration in time range)  heparin injection 5,000 Units (has no administration in time range)  acetaminophen (TYLENOL) tablet 650 mg (has no administration in time range)    Or  acetaminophen (TYLENOL) suppository 650 mg (has no administration in time range)  oxyCODONE (Oxy IR/ROXICODONE) immediate release tablet 5-10 mg (has no administration in time range)  ondansetron (  ZOFRAN) tablet 4 mg (has no administration in time range)    Or  ondansetron (ZOFRAN) injection 4 mg (has no administration in time range)  albuterol (PROVENTIL) (2.5 MG/3ML) 0.083% nebulizer solution 2.5 mg (has no administration in time range)  hydrALAZINE (APRESOLINE) injection 10 mg (has no administration in time range)  sodium bicarbonate tablet 650 mg (has no administration in time range)  furosemide (LASIX) injection 80 mg (80 mg Intravenous Given 08/20/23 1158)     IMPRESSION / MDM / ASSESSMENT AND PLAN / ED COURSE  I reviewed the triage vital signs and the nursing notes.   [name] presentation is most consistent with acute presentation with potential threat to life or bodily function.   Patient comes in with worsening leg swelling I suspect related to known CKD.  Will recheck labs today.  Will get ultrasound to make sure no DVTs.  Good distal pulses unlikely arterial issue.  [name] blood work does show elevated creatinine up to 10 with a baseline of 7.  Her BUN is also increased from 80-107 bicarb is lowered to  15.  Her calcium is normal at 4.4  Discussed with patient worsening kidney function I discussed with Dr. Cherylann Ratel and he will come evaluate patient okay with trialing some Lasix to see if that will help with the leg swelling but suspect patient will need dialysis and given elevated BUN.  Patient will be admitted to the hospital team for further workup  The patient is on the cardiac monitor to evaluate for evidence of arrhythmia and/or significant heart rate changes.      FINAL CLINICAL IMPRESSION(S) / ED DIAGNOSES   Final diagnoses:  Acute renal failure superimposed on stage 5 chronic kidney disease, not on chronic dialysis, unspecified acute renal failure type (HCC)  Leg swelling     Rx / DC Orders   ED Discharge Orders     None        Note:  This document was prepared using Dragon voice recognition software and may include unintentional dictation errors.   Concha Se, MD 08/20/23 [phone]

## 2023-08-21 ENCOUNTER — Other Ambulatory Visit: Payer: PRIVATE HEALTH INSURANCE

## 2023-08-21 DIAGNOSIS — R601 Generalized edema: Secondary | ICD-10-CM | POA: Diagnosis not present

## 2023-08-21 LAB — CBC
HCT: 25.1 % — ABNORMAL LOW (ref 36.0–46.0)
Hemoglobin: 8.3 g/dL — ABNORMAL LOW (ref 12.0–15.0)
MCH: 29 pg (ref 26.0–34.0)
MCHC: 33.1 g/dL (ref 30.0–36.0)
MCV: 87.8 fL (ref 80.0–100.0)
Platelets: 171 10*3/uL (ref 150–400)
RBC: 2.86 MIL/uL — ABNORMAL LOW (ref 3.87–5.11)
RDW: 13.9 % (ref 11.5–15.5)
WBC: 1.8 10*3/uL — ABNORMAL LOW (ref 4.0–10.5)
nRBC: 0 % (ref 0.0–0.2)

## 2023-08-21 LAB — COMPREHENSIVE METABOLIC PANEL
ALT: 13 U/L (ref 0–44)
AST: 18 U/L (ref 15–41)
Albumin: 3.2 g/dL — ABNORMAL LOW (ref 3.5–5.0)
Alkaline Phosphatase: 69 U/L (ref 38–126)
Anion gap: 13 (ref 5–15)
BUN: 101 mg/dL — ABNORMAL HIGH (ref 8–23)
CO2: 18 mmol/L — ABNORMAL LOW (ref 22–32)
Calcium: 7 mg/dL — ABNORMAL LOW (ref 8.9–10.3)
Chloride: 109 mmol/L (ref 98–111)
Creatinine, Ser: 9.92 mg/dL — ABNORMAL HIGH (ref 0.44–1.00)
GFR, Estimated: 4 mL/min — ABNORMAL LOW (ref >60)
Glucose, Bld: 85 mg/dL (ref 70–99)
Potassium: 4.6 mmol/L (ref 3.5–5.1)
Sodium: 140 mmol/L (ref 135–145)
Total Bilirubin: 0.8 mg/dL (ref 0.0–1.2)
Total Protein: 6 g/dL — ABNORMAL LOW (ref 6.5–8.1)

## 2023-08-21 LAB — HEPATITIS B SURFACE ANTIGEN: Hepatitis B Surface Ag: NONREACTIVE

## 2023-08-21 MED ORDER — FUROSEMIDE 80 MG PO TABS: 80.0000 mg | ORAL_TABLET | Freq: Two times a day (BID) | ORAL | 0 refills | Status: DC

## 2023-08-21 MED ORDER — SODIUM BICARBONATE 650 MG PO TABS: 1300.0000 mg | ORAL_TABLET | Freq: Two times a day (BID) | ORAL | 0 refills | Status: AC

## 2023-08-21 NOTE — Progress Notes (Signed)
 Patient is not able to walk the distance required to go the bathroom, or he/she is unable to safely negotiate stairs required to access the bathroom.  A 3in1 BSC will alleviate this problem

## 2023-08-21 NOTE — TOC Progression Note (Signed)
 Transition of Care [facility]) - Progression Note    Patient Details  Name: Holly Mcgee MRN: [id number] Date of Birth: 03-Aug-1945  [facility] of [facility]) CM/SW Contact  Marlowe Sax, RN Phone Number: 08/21/2023, 10:56 AM  Clinical Narrative:    Met with the patient and her family in the room, reviewed the code44, She stated she lives alone but does have a lot of help, She requested a RW and 3 in1, I requested [employer] to deliver to the bedside She wants to wait to get [facility] and will ask her PCP if she decides to do it   Expected Discharge Plan: Home/Self Care Barriers to Discharge: Barriers Resolved  Expected Discharge Plan and Services   Discharge Planning Services: CM Consult   Living arrangements for the past 2 months: Single Family Home Expected Discharge Date: 08/21/23               DME Arranged: Dan Humphreys rolling, 3-N-1 DME Agency: [employer] Date DME Agency Contacted: 08/21/23 Time DME Agency Contacted: 1055 Representative spoke with at DME Agency: Osvaldo Angst Arranged: Refused HH           Social Determinants of Health (SDOH) Interventions SDOH Screenings   Food Insecurity: No Food Insecurity (08/20/2023)  Housing: Low Risk  (08/20/2023)  Transportation Needs: No Transportation Needs (08/20/2023)  Utilities: Not At Risk (08/20/2023)  Depression (PHQ2-9): Low Risk  (01/22/2022)  Financial Resource Strain: Low Risk  (04/01/2023)   Received from [facility]  Physical Activity: Unknown (04/01/2023)   Received from [facility]  Social Connections: Moderately Isolated (08/20/2023)  Stress: No Stress Concern Present (11/10/2017)  Tobacco Use: Low Risk  (08/20/2023)    Readmission Risk Interventions     No data to display

## 2023-08-21 NOTE — Evaluation (Addendum)
 Occupational Therapy Evaluation Patient Details Name: Holly Mcgee MRN: [id number] DOB: Nov 16, 1945 Today's Date: 08/21/2023   History of Present Illness   Pt is a 78 y.o. female admitted for LE edema & fluid overload, AKI, metabolic acidosis. PMHx of left-sided renal cell carcinoma status post partial left nephrectomy, cystic right kidney, dementia, COPD, glaucoma, HTN, HLD, secondary hyperparathyroidism, anemia of chronic kidney disease, hypothyroidism     Clinical Impressions PTA, pt performs ADLs/mobility MOD I with use of SPC. Family provides assist for IADLs at baseline, can provide 24/7 supervision at hospital discharge. Noted edema RLE > LLE, redness in L eye, and c/o R groin pain. Pt able to complete transfers with CGA progressing to supervision, performs LB dressing tasks supervision seated EOB, and functional mobility using RW in hallway ~75 ft with CGA. Educated pt and family on recommendations for DME/AE including BSC, RW, reacher, sock aide, and shoe funnel for continued independence at home. Pt will benefit from continued OT services to address generalized weakness and balance deficits. OT will continue to follow acutely.      If plan is discharge home, recommend the following:   A little help with walking and/or transfers;A little help with bathing/dressing/bathroom;Assistance with cooking/housework;Assist for transportation;Help with stairs or ramp for entrance     Functional Status Assessment   Patient has had a recent decline in their functional status and demonstrates the ability to make significant improvements in function in a reasonable and predictable amount of time.     Equipment Recommendations   BSC/3in1 (RW), sock aide, reacher, shoe funnel, grab bars       Precautions/Restrictions   Precautions Precautions: Fall Restrictions Weight Bearing Restrictions Per Provider Order: No     Mobility Bed Mobility Overal bed mobility: Modified  Independent             General bed mobility comments: pt recieved sitting EOB upon arrival    Transfers Overall transfer level: Needs assistance Equipment used: Rolling walker (2 wheels) Transfers: Sit to/from Stand Sit to Stand: Contact guard assist           General transfer comment: cues to push up from bed      Balance Overall balance assessment: Mild deficits observed, not formally tested                                         ADL either performed or assessed with clinical judgement   ADL Overall ADL's : At baseline;Needs assistance/impaired                     Lower Body Dressing: Sit to/from stand;Supervision/safety Lower Body Dressing Details (indicate cue type and reason): dons/doffs socks seated EOB, no LOB             Functional mobility during ADLs: Contact guard assist;Rolling walker (2 wheels) General ADL Comments: anticipate pt close or at baseline for ADL performance     Vision Baseline Vision/History: 3 Glaucoma              Pertinent Vitals/Pain Pain Assessment Pain Assessment: 0-10 Pain Score: 5  Pain Location: R groin Pain Descriptors / Indicators: Discomfort, Dull Pain Intervention(s): Limited activity within patient's tolerance, Monitored during session     Extremity/Trunk Assessment Upper Extremity Assessment Upper Extremity Assessment: Generalized weakness   Lower Extremity Assessment Lower Extremity Assessment: Generalized weakness (edema in R>LLE)   Cervical /  Trunk Assessment Cervical / Trunk Assessment: Normal   Communication Communication Communication: No apparent difficulties   Cognition Arousal: Alert Behavior During Therapy: WFL for tasks assessed/performed Cognition: No apparent impairments                               Following commands: Intact       Cueing  General Comments      RLE swelling, RN in room for discharge   Exercises Exercises: Other  exercises Other Exercises Other Exercises: edu on role and purpose of OT Other Exercises: recommending LB AE per pt/family request (reacher, sock aide, shoe funnel, LH sponge)        Home Living Family/patient expects to be discharged to:: Private residence Living Arrangements: Alone Available Help at Discharge: Family;Available 24 hours/day Type of Home: House Home Access: Stairs to enter [employer] of Steps: 1 Entrance Stairs-Rails: None Home Layout: Able to live on main level with bedroom/bathroom;Multi-level Alternate Level Stairs-Number of Steps: flight   Bathroom Shower/Tub: Producer, television/film/video: Standard     Home Equipment: Cane - single point          Prior Functioning/Environment Prior Level of Function : Independent/Modified Independent             Mobility Comments: SPC, no falls hx ADLs Comments: independent with ADLs, family assists with IADLs    OT Problem List: Decreased strength;Decreased activity tolerance;Impaired balance (sitting and/or standing);Decreased knowledge of precautions;Decreased knowledge of use of DME or AE;Pain   OT Treatment/Interventions: Self-care/ADL training;Neuromuscular education;Therapeutic exercise;Energy conservation;DME and/or AE instruction;Balance training;Patient/family education;Therapeutic activities      OT Goals(Current goals can be found in the care plan section)   Acute Rehab OT Goals OT Goal Formulation: With patient Time For Goal Achievement: 09/04/23 Potential to Achieve Goals: Good ADL Goals Pt Will Perform Grooming: with modified independence;standing Pt Will Perform Upper Body Dressing: with modified independence;standing Pt Will Perform Lower Body Dressing: with modified independence;with adaptive equipment;sit to/from stand Pt Will Transfer to Toilet: with modified independence;ambulating;grab bars;bedside commode Pt Will Perform Toileting - Clothing Manipulation and hygiene:  with modified independence;sitting/lateral leans;sit to/from stand;with adaptive equipment   OT Frequency:  Min 3X/week    Co-evaluation PT/OT/SLP Co-Evaluation/Treatment: Yes Reason for Co-Treatment: To address functional/ADL transfers;For patient/therapist safety PT goals addressed during session: Mobility/safety with mobility;Balance;Proper use of DME OT goals addressed during session: ADL's and self-care      AM-PAC OT "6 Clicks" Daily Activity     Outcome Measure Help from another person eating meals?: None Help from another person taking care of personal grooming?: None Help from another person toileting, which includes using toliet, bedpan, or urinal?: A Little Help from another person bathing (including washing, rinsing, drying)?: None Help from another person to put on and taking off regular upper body clothing?: None Help from another person to put on and taking off regular lower body clothing?: A Little 6 Click Score: 22   End of Session Equipment Utilized During Treatment: Gait belt;Rolling walker (2 wheels) Nurse Communication: Mobility status  Activity Tolerance: Patient tolerated treatment well Patient left: in bed;with call bell/phone within reach;with nursing/sitter in room;with family/visitor present  OT Visit Diagnosis: Other abnormalities of gait and mobility (R26.89);Muscle weakness (generalized) (M62.81)                Time: 1610-9604 OT Time Calculation (min): 17 min Charges:  OT General Charges $OT Visit: 1 Visit OT  Evaluation $OT Eval Low Complexity: 1 Low  Reice Bienvenue L. Abhijay Morriss, OTR/L  08/21/23, 11:09 AM

## 2023-08-21 NOTE — Evaluation (Signed)
 Physical Therapy Evaluation Patient Details Name: ZAKYAH YANES MRN: [id number] DOB: 07/30/1945 Today's Date: 08/21/2023  History of Present Illness  Pt is a 78 y.o. female admitted for LE edema & fluid overload, AKI, metabolic acidosis. PMHx of left-sided renal cell carcinoma status post partial left nephrectomy, cystic right kidney, dementia, COPD, glaucoma, HTN, HLD, secondary hyperparathyroidism, anemia of chronic kidney disease, hypothyroidism  Clinical Impression  Pt received sitting on the EOB with OT and family members agreeable for PT/OT CO eval. Pt is a nd O x 4. Pt lives alone Ind in house at [facility] and uses [facility]. Family provides excellent support. PT Assessment revelaed genralized weakness, increased edem ain RLE >LLE, blood clot in L eye and BP after exertion 155/88 HR 105 after 10 mins of rest. Pt without distress. Pt advised to elevated BLE and RPE scale to prevent fatigue and falls. Pt advised to purchase BP machine. Pt also advised to use FWW and to illuminate home for safety. Pt will benefit from continued PT beyond Acute. Pt and family agrees with POC.       If plan is discharge home, recommend the following: A little help with walking and/or transfers;A little help with bathing/dressing/bathroom;Assistance with cooking/housework;Assist for transportation;Help with stairs or ramp for entrance   Can travel by private vehicle        Equipment Recommendations Rolling walker (2 wheels);BSC/3in1  Recommendations for Other Services       Functional Status Assessment Patient has had a recent decline in their functional status and demonstrates the ability to make significant improvements in function in a reasonable and predictable amount of time.     Precautions / Restrictions Precautions Precautions: Fall Restrictions Weight Bearing Restrictions Per Provider Order: No      Mobility  Bed Mobility Overal bed mobility: Modified Independent             General bed  mobility comments: pt recieved sitting EOB upon arrival    Transfers Overall transfer level: Needs assistance Equipment used: Rolling walker (2 wheels) Transfers: Sit to/from Stand Sit to Stand: Supervision           General transfer comment: cues to push up from bed    Ambulation/Gait Ambulation/Gait assistance: Contact guard assist, Supervision Gait Distance (Feet): 72 Feet Assistive device: Rolling walker (2 wheels) Gait Pattern/deviations: Step-through pattern Gait velocity: dec     General Gait Details: decreased and uneven step length  Stairs            Wheelchair Mobility     Tilt Bed    Modified Rankin (Stroke Patients Only)       Balance Overall balance assessment: Mild deficits observed, not formally tested                                           Pertinent Vitals/Pain Pain Assessment Pain Assessment: No/denies pain Pain Score: 5  Pain Location: R groin Pain Descriptors / Indicators: Discomfort, Dull Pain Intervention(s): Limited activity within patient's tolerance    Home Living Family/patient expects to be discharged to:: Private residence Living Arrangements: Alone Available Help at Discharge: Family;Available 24 hours/day Type of Home: House Home Access: Stairs to enter Entrance Stairs-Rails: None Entrance Stairs-Number of Steps: 1 Alternate Level Stairs-Number of Steps: flight Home Layout: Able to live on main level with bedroom/bathroom;Multi-level Home Equipment: Gilmer Mor - single point      Prior  Function Prior Level of Function : Independent/Modified Independent             Mobility Comments: SPC, no falls hx ADLs Comments: independent with ADLs, family assists with IADLs     Extremity/Trunk Assessment   Upper Extremity Assessment Upper Extremity Assessment: Generalized weakness    Lower Extremity Assessment Lower Extremity Assessment: Generalized weakness (edema in R>LLE)    Cervical / Trunk  Assessment Cervical / Trunk Assessment: Normal  Communication   Communication Communication: No apparent difficulties    Cognition Arousal: Alert Behavior During Therapy: WFL for tasks assessed/performed                             Following commands: Intact       Cueing       General Comments General comments (skin integrity, edema, etc.): RLE swelling, RN in room for discharge    Exercises     Assessment/Plan    PT Assessment Patient needs continued PT services  PT Problem List Decreased strength;Decreased activity tolerance;Decreased mobility;Pain       PT Treatment Interventions Gait training;Stair training;Therapeutic activities;Therapeutic exercise;Patient/family education    PT Goals (Current goals can be found in the Care Plan section)  Acute Rehab PT Goals Patient Stated Goal: to return home with family and HH PT Goal Formulation: With patient/family Time For Goal Achievement: 09/04/23 Potential to Achieve Goals: Good    Frequency Min 2X/week     Co-evaluation   Reason for Co-Treatment: To address functional/ADL transfers;For patient/therapist safety PT goals addressed during session: Mobility/safety with mobility;Balance;Proper use of DME OT goals addressed during session: ADL's and self-care       AM-PAC PT "6 Clicks" Mobility  Outcome Measure Help needed turning from your back to your side while in a flat bed without using bedrails?: None Help needed moving from lying on your back to sitting on the side of a flat bed without using bedrails?: None Help needed moving to and from a bed to a chair (including a wheelchair)?: A Little Help needed standing up from a chair using your arms (e.g., wheelchair or bedside chair)?: A Little Help needed to walk in hospital room?: A Little Help needed climbing 3-5 steps with a railing? : A Lot 6 Click Score: 19    End of Session Equipment Utilized During Treatment: Gait belt Activity Tolerance:  Patient limited by pain Patient left: in bed;with family/visitor present;with call bell/phone within reach Nurse Communication: Mobility status PT Visit Diagnosis: Unsteadiness on feet (R26.81);Muscle weakness (generalized) (M62.81);Difficulty in walking, not elsewhere classified (R26.2);Pain    Time: 1020-1041 PT Time Calculation (min) (ACUTE ONLY): 21 min   Charges:   PT Evaluation $PT Eval Low Complexity: 1 Low   PT General Charges $$ ACUTE PT VISIT: 1 Visit        Tzirel Leonor PT DPT 11:02 AM,08/21/23

## 2023-08-21 NOTE — Discharge Summary (Signed)
 Physician Discharge Summary  Holly Mcgee:[id number] DOB: Jul 12, 1945 DOA: 08/20/2023  PCP: [name], MD  Admit date: 08/20/2023 Discharge date: 08/21/2023  Admitted From: Home Disposition:  Home  Recommendations for Outpatient Follow-up:  Follow up with PCP in 1-2 weeks Outpatient referral to nephrology  Home Health:Yes PT OT  Equipment/Devices:None   Discharge Condition:Stable  CODE STATUS:FULL  Diet recommendation: Reg  Brief/Interim Summary:  78 y.o. female with medical history significant of hypertension, glaucoma who presents to the ER for evaluation of bilateral lower extremity swelling that she states has been increasing in severity for the past several days.  Also associated with pain worse on the right side with decreased ability to ambulate.   On arrival vital signs overall stable save for some hypertension.  Laboratory investigation revealed markedly abnormal kidney function with BUN 107 and creatinine 10.  Patient states that she does make a small amount of urine.   On review of the medical record the patient had been evaluated by nephrology in the past and there was at least a referral to pretransplant evaluation that have been initiated.  I have discussed case with nephrology who agreed to consult.  I have also discussed the possibility of initiating hemodialysis with the patient and her son at bedside  Despite team's efforts to explain the urgent and strong recommendation for initiation of hemodialysis the patient continues to decline.  She wants to seek a second opinion at [facility] or [facility].  Nephrology agreed to place an outpatient referral to a nephrology practice in tertiary care center.  The patient will be discharged home on Lasix 80 mg p.o. twice daily and sodium bicarbonate 1300 mg twice daily.  Ambulatory referral to palliative care placed on discharge   Discharge Diagnoses:  Principal Problem:   Anasarca    Anasarca/fluid overload Uremia AKI,  likely progressing to ESRD Metabolic acidosis Patient presented with BUN 107 and creatinine 10.  States she still produces a small amount of urine.  On chart review has been previously referred to transplant nephrology in the past.  Follow-up is unclear.  I alerted the patient to the possibility of initiating hemodialysis during this admission.  She and her son at bedside seem agreeable.. Plan: The patient is declining hemodialysis at this time.  Lengthy conversations with myself as well as the nephrology service.  Patient continues to decline and family is aware.  Wishes to seek second opinion at [facility].  Nephrology to place ambulatory referral.  At time of discharge will recommend Lasix 80 mg p.o. twice daily and sodium bicarbonate 1300 mg p.o. twice daily.  Ambulatory referral to palliative care placed.   Discharge Instructions  Discharge Instructions     Amb Referral to Palliative Care   Complete by: As directed    Diet - low sodium heart healthy   Complete by: As directed    Increase activity slowly   Complete by: As directed       Allergies as of 08/21/2023       Reactions   Citrus Itching, Swelling   Pineapple    rash        Medication List     STOP taking these medications    carvedilol 6.25 MG tablet Commonly known as: COREG   cholecalciferol 25 MCG (1000 UNIT) tablet Commonly known as: VITAMIN D3   dorzolamide-timolol 2-0.5 % ophthalmic solution Commonly known as: COSOPT   hydrocortisone 2.5 % ointment   Latanoprostene Bunod 0.024 % Soln Commonly known as:  Vyzulta   lisinopril 40 MG tablet Commonly known as: ZESTRIL   multivitamin with minerals Tabs tablet   rivastigmine 1.5 MG capsule Commonly known as: EXELON       TAKE these medications    furosemide 80 MG tablet Commonly known as: Lasix Take 1 tablet (80 mg total) by mouth 2 (two) times daily. What changed:  medication strength how much to take when to take this reasons to take  this   sodium bicarbonate 650 MG tablet Take 2 tablets (1,300 mg total) by mouth 2 (two) times daily.               Durable Medical Equipment  (From admission, onward)           Start     Ordered   08/21/23 1039  For home use only DME Walker rolling  Once       Question Answer Comment  Walker: With 5 Inch Wheels   Patient needs a walker to treat with the following condition Weakness      08/21/23 1039   08/21/23 1039  For home use only DME 3 n 1  Once        08/21/23 1039            Allergies  Allergen Reactions   Citrus Itching and Swelling   Pineapple     rash    Consultations: Nephrology   Procedures/Studies: US Venous Img Lower Bilateral Result Date: 08/20/2023 CLINICAL DATA:  Bilateral lower extremity edema. EXAM: Bilateral LOWER EXTREMITY VENOUS DOPPLER ULTRASOUND TECHNIQUE: Gray-scale sonography with compression, as well as color and duplex ultrasound, were performed to evaluate the deep venous system(s) from the level of the common femoral vein through the popliteal and proximal calf veins. COMPARISON:  Right lower extremity ultrasound 10/17/2015 FINDINGS: VENOUS Normal compressibility of the common femoral, superficial femoral, and popliteal veins, as well as the visualized calf veins. Visualized portions of profunda femoral vein and great saphenous vein unremarkable. No filling defects to suggest DVT on grayscale or color Doppler imaging. Doppler waveforms show normal direction of venous flow, normal respiratory plasticity and response to augmentation. OTHER None. Limitations: none IMPRESSION: No evidence of bilateral lower extremity DVT Electronically Signed   By: Karen Kays M.D.   On: 08/20/2023 14:44   DG FEMUR, MIN 2 VIEWS RIGHT Result Date: 08/20/2023 CLINICAL DATA:  Leg swelling and pain.  No history of trauma EXAM: RIGHT FEMUR 2 VIEWS COMPARISON:  None Available. FINDINGS: Osteopenia. Slight joint space loss of the hip joint. Minimal osteophyte  formation. Hyperostosis. Slight degenerative changes as well of the knee joint. Vascular calcifications. IMPRESSION: Osteopenia.  Mild degenerative changes. Electronically Signed   By: Karen Kays M.D.   On: 08/20/2023 12:57   DG HIP UNILAT WITH PELVIS 2-3 VIEWS RIGHT Result Date: 08/20/2023 CLINICAL DATA:  Worsening leg swelling and pain. Chronic kidney disease. EXAM: DG HIP (WITH OR WITHOUT PELVIS) 3V RIGHT COMPARISON:  None Available. FINDINGS: Osteopenia. No fracture or dislocation. Mild concentric joint space loss of the hips. There is some osteophytes seen and some joint space loss along the sacroiliac joints. No fracture or dislocation. Hyperostosis. Vascular calcifications. IMPRESSION: Moderate degenerative changes.  Osteopenia. Electronically Signed   By: Karen Kays M.D.   On: 08/20/2023 12:55      Subjective: Seen and examined on the day of discharge.  Stable no distress.  Appropriate discharge home  Discharge Exam: Vitals:   08/21/23 0259 08/21/23 0808  BP: (!) 149/83 (!) 143/81  Pulse: 76 70  Resp: 18 16  Temp: (!) 97.3 F (36.3 C) 98.3 F (36.8 C)  SpO2: 98% 99%   Vitals:   08/20/23 1537 08/20/23 2001 08/21/23 0259 08/21/23 0808  BP: (!) 170/100 139/82 (!) 149/83 (!) 143/81  Pulse: 92 76 76 70  Resp: 16 18 18 16   Temp: 97.8 F (36.6 C) 97.8 F (36.6 C) (!) 97.3 F (36.3 C) 98.3 F (36.8 C)  TempSrc: Oral Oral    SpO2: 90% 99% 98% 99%  Weight:      Height:        General: Pt is alert, awake, not in acute distress Cardiovascular: RRR, S1/S2 +, no rubs, no gallops Respiratory: CTA bilaterally, no wheezing, no rhonchi Abdominal: Soft, NT, ND, bowel sounds + Extremities: no edema, no cyanosis    The results of significant diagnostics from this hospitalization (including imaging, microbiology, ancillary and laboratory) are listed below for reference.     Microbiology: No results found for this or any previous visit (from the past 240 hours).   Labs: BNP  (last 3 results) Recent Labs    08/20/23 0909  BNP >4,500.0*   Basic Metabolic Panel: Recent Labs  Lab 08/20/23 0909 08/21/23 0331  NA 138 140  K 4.4 4.6  CL 112* 109  CO2 15* 18*  GLUCOSE 75 85  BUN 107* 101*  CREATININE 10.00* 9.92*  CALCIUM 6.8* 7.0*   Liver Function Tests: Recent Labs  Lab 08/21/23 0331  AST 18  ALT 13  ALKPHOS 69  BILITOT 0.8  PROT 6.0*  ALBUMIN 3.2*   No results for input(s): "LIPASE", "AMYLASE" in the last 168 hours. No results for input(s): "AMMONIA" in the last 168 hours. CBC: Recent Labs  Lab 08/20/23 0909 08/21/23 0331  WBC 2.4* 1.8*  HGB 8.6* 8.3*  HCT 26.9* 25.1*  MCV 93.7 87.8  PLT 162 171   Cardiac Enzymes: No results for input(s): "CKTOTAL", "CKMB", "CKMBINDEX", "TROPONINI" in the last 168 hours. BNP: Invalid input(s): "POCBNP" CBG: No results for input(s): "GLUCAP" in the last 168 hours. D-Dimer No results for input(s): "DDIMER" in the last 72 hours. Hgb A1c Recent Labs    08/20/23 1535  HGBA1C 4.8   Lipid Profile No results for input(s): "CHOL", "HDL", "LDLCALC", "TRIG", "CHOLHDL", "LDLDIRECT" in the last 72 hours. Thyroid function studies Recent Labs    08/20/23 0909  TSH 2.237   Anemia work up No results for input(s): "VITAMINB12", "FOLATE", "FERRITIN", "TIBC", "IRON", "RETICCTPCT" in the last 72 hours. Urinalysis    Component Value Date/Time   COLORURINE STRAW (A) 08/20/2023 1413   APPEARANCEUR HAZY (A) 08/20/2023 1413   LABSPEC 1.006 08/20/2023 1413   PHURINE 6.0 08/20/2023 1413   GLUCOSEU NEGATIVE 08/20/2023 1413   GLUCOSEU NEGATIVE 05/22/2017 1105   HGBUR SMALL (A) 08/20/2023 1413   BILIRUBINUR NEGATIVE 08/20/2023 1413   BILIRUBINUR neg 03/14/2015 1346   KETONESUR NEGATIVE 08/20/2023 1413   PROTEINUR 30 (A) 08/20/2023 1413   UROBILINOGEN 0.2 05/22/2017 1105   NITRITE NEGATIVE 08/20/2023 1413   LEUKOCYTESUR NEGATIVE 08/20/2023 1413   Sepsis Labs Recent Labs  Lab 08/20/23 0909  08/21/23 0331  WBC 2.4* 1.8*   Microbiology No results found for this or any previous visit (from the past 240 hours).   Time coordinating discharge: Over 30 minutes  SIGNED:   Tresa Moore, MD  [facility] 08/21/2023, 2:10 PM Pager   If 7PM-7AM, please contact night-coverage

## 2023-08-21 NOTE — Progress Notes (Signed)
 [facility]  ROUNDING NOTE   Subjective:   Patient seen sitting at side of bed Multiple family members at bedside including son and daughter Tolerating small meals Generalized edema remains  Minimal urine output recorded with furosemide drip  Objective:  Vital signs in last 24 hours:  Temp:  [97.3 F (36.3 C)-98.3 F (36.8 C)] 98.3 F (36.8 C) (03/21 0808) Pulse Rate:  [70-92] 70 (03/21 0808) Resp:  [16-18] 16 (03/21 0808) BP: (139-170)/(81-100) 143/81 (03/21 0808) SpO2:  [90 %-99 %] 99 % (03/21 0808)  Weight change:  Filed Weights   08/20/23 0906 08/20/23 1349  Weight: 65.3 kg 69.1 kg    Intake/Output: I/O last 3 completed shifts: In: 150.3 [P.O.:150; I.V.:0.3] Out: 400 [Urine:400]   Intake/Output this shift:  Total I/O In: -  Out: 550 [Urine:550]  Physical Exam: General: NAD, sitting at bedside  Head: Normocephalic, atraumatic. Moist oral mucosal membranes  Eyes: Anicteric  Lungs:  Clear to auscultation, normal effort  Heart: Regular rate and rhythm  Abdomen:  Soft, nontender,   Extremities: 3+ peripheral edema.  Neurologic: Alert, moving all four extremities  Skin: No lesions  Access: None    Basic Metabolic Panel: Recent Labs  Lab 08/20/23 0909 08/21/23 0331  NA 138 140  K 4.4 4.6  CL 112* 109  CO2 15* 18*  GLUCOSE 75 85  BUN 107* 101*  CREATININE 10.00* 9.92*  CALCIUM 6.8* 7.0*    Liver Function Tests: Recent Labs  Lab 08/21/23 0331  AST 18  ALT 13  ALKPHOS 69  BILITOT 0.8  PROT 6.0*  ALBUMIN 3.2*   No results for input(s): "LIPASE", "AMYLASE" in the last 168 hours. No results for input(s): "AMMONIA" in the last 168 hours.  CBC: Recent Labs  Lab 08/20/23 0909 08/21/23 0331  WBC 2.4* 1.8*  HGB 8.6* 8.3*  HCT 26.9* 25.1*  MCV 93.7 87.8  PLT 162 171    Cardiac Enzymes: No results for input(s): "CKTOTAL", "CKMB", "CKMBINDEX", "TROPONINI" in the last 168 hours.  BNP: Invalid input(s): "POCBNP"  CBG: No  results for input(s): "GLUCAP" in the last 168 hours.  Microbiology: Results for orders placed or performed during the hospital encounter of 02/16/19  Urine Culture     Status: None   Collection Time: 02/16/19  9:33 AM   Specimen: Urine, Random  Result Value Ref Range Status   Specimen Description   Final    URINE, RANDOM Performed at [facility], [address] [place], [place] [address]    Special Requests   Final    NONE Performed at [facility], [address], [place], [place] [address]    Culture   Final    NO GROWTH Performed at [facility], [address], [place], [place] [address]    Report Status 02/17/2019 FINAL  Final  SARS CORONAVIRUS 2 (TAT 6-24 HRS) Nasopharyngeal Nasopharyngeal Swab     Status: None   Collection Time: 02/16/19 11:05 AM   Specimen: Nasopharyngeal Swab  Result Value Ref Range Status   SARS Coronavirus 2 NEGATIVE NEGATIVE Final    Comment: (NOTE) SARS-CoV-2 target nucleic acids are NOT DETECTED. The SARS-CoV-2 RNA is generally detectable in upper and lower respiratory specimens during the acute phase of infection. Negative results do not preclude SARS-CoV-2 infection, do not rule out co-infections with other pathogens, and should not be used as the sole basis for treatment or other patient management decisions. Negative results must be combined with clinical observations, patient  history, and epidemiological information. The expected result is Negative. Fact Sheet for Patients: HairSlick.no Fact Sheet for Healthcare Providers: quierodirigir.com This test is not yet approved or cleared by the [place] FDA and  has been authorized for detection and/or diagnosis of SARS-CoV-2 by FDA under an Emergency Use Authorization (EUA). This EUA will remain  in effect (meaning this test can be used) for the duration of the COVID-19 declaration under Section 56 4(b)(1)  of the Act, 21 U.S.C. section 360bbb-3(b)(1), unless the authorization is terminated or revoked sooner. Performed at [facility], [address], [place], [place] [address]     Coagulation Studies: No results for input(s): "LABPROT", "INR" in the last 72 hours.  Urinalysis: Recent Labs    08/20/23 1413  COLORURINE STRAW*  LABSPEC 1.006  PHURINE 6.0  GLUCOSEU NEGATIVE  HGBUR SMALL*  BILIRUBINUR NEGATIVE  KETONESUR NEGATIVE  PROTEINUR 30*  NITRITE NEGATIVE  LEUKOCYTESUR NEGATIVE      Imaging: US Venous Img Lower Bilateral Result Date: 08/20/2023 CLINICAL DATA:  Bilateral lower extremity edema. EXAM: Bilateral LOWER EXTREMITY VENOUS DOPPLER ULTRASOUND TECHNIQUE: Gray-scale sonography with compression, as well as color and duplex ultrasound, were performed to evaluate the deep venous system(s) from the level of the common femoral vein through the popliteal and proximal calf veins. COMPARISON:  Right lower extremity ultrasound 10/17/2015 FINDINGS: VENOUS Normal compressibility of the common femoral, superficial femoral, and popliteal veins, as well as the visualized calf veins. Visualized portions of profunda femoral vein and great saphenous vein unremarkable. No filling defects to suggest DVT on grayscale or color Doppler imaging. Doppler waveforms show normal direction of venous flow, normal respiratory plasticity and response to augmentation. OTHER None. Limitations: none IMPRESSION: No evidence of bilateral lower extremity DVT Electronically Signed   By: Karen Kays M.D.   On: 08/20/2023 14:44   DG FEMUR, MIN 2 VIEWS RIGHT Result Date: 08/20/2023 CLINICAL DATA:  Leg swelling and pain.  No history of trauma EXAM: RIGHT FEMUR 2 VIEWS COMPARISON:  None Available. FINDINGS: Osteopenia. Slight joint space loss of the hip joint. Minimal osteophyte formation. Hyperostosis. Slight degenerative changes as well of the knee joint. Vascular calcifications. IMPRESSION: Osteopenia.  Mild  degenerative changes. Electronically Signed   By: Karen Kays M.D.   On: 08/20/2023 12:57   DG HIP UNILAT WITH PELVIS 2-3 VIEWS RIGHT Result Date: 08/20/2023 CLINICAL DATA:  Worsening leg swelling and pain. Chronic kidney disease. EXAM: DG HIP (WITH OR WITHOUT PELVIS) 3V RIGHT COMPARISON:  None Available. FINDINGS: Osteopenia. No fracture or dislocation. Mild concentric joint space loss of the hips. There is some osteophytes seen and some joint space loss along the sacroiliac joints. No fracture or dislocation. Hyperostosis. Vascular calcifications. IMPRESSION: Moderate degenerative changes.  Osteopenia. Electronically Signed   By: Karen Kays M.D.   On: 08/20/2023 12:55     Medications:    furosemide (LASIX) 200 mg in dextrose 5 % 100 mL (2 mg/mL) infusion Stopped (08/21/23 1027)    carvedilol  6.25 mg Oral BID WC   heparin  5,000 Units Subcutaneous Q8H   sodium bicarbonate  1,300 mg Oral BID   acetaminophen **OR** acetaminophen, albuterol, hydrALAZINE, ondansetron **OR** ondansetron (ZOFRAN) IV, oxyCODONE  Assessment/ Plan:  Holly Mcgee is a 78 y.o.  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 Patient has had a longstanding history of renal insufficiency/volume overload. Her EGFR is remained quite low recently. Back in October her EGFR was 4 and it remains quite low at 4 at the moment.   Creatinine remains quite elevated.  Very  little response to IV furosemide drip started  yesterday.  Discussed with patient and family that based on lack of response to prescribed medications overnight, renal replacement therapy will be needed to achieve desired results.  Discussed with the patient that without renal replacement therapy, her fluid status will not improve, she will likely require frequent trips to the emergency department, and overall health will continue to decline.  Patient continues to request second opinion and discharge.  Discussed diet recommendations and fluid restriction of 32 ounces with patient.  Will refer patient to [facility] nephrology for second opinion.  Patient currently has scheduled appointment in our office at the end of this month.  Family requested community palliative follow-up.  Recommend discharging on oral furosemide 80 mg twice daily.  Lab Results  Component Value Date   CREATININE 9.92 (H) 08/21/2023   CREATININE 10.00 (H) 08/20/2023   CREATININE 7.01 (HH) 01/22/2022    Intake/Output Summary (Last 24 hours) at 08/21/2023 1521 Last data filed at 08/21/2023 0918 Gross per 24 hour  Intake 150.34 ml  Output 950 ml  Net -799.66 ml   2.  Anemia of chronic kidney disease.  Most recent hemoglobin was low at 8.6. Hemoglobin remains decreased at 8.3.  Will consider ESA as outpatient.   3.  Acute metabolic acidosis.  Serum bicarbonate slightly improved to 18.  Patient will discharge on sodium bicarbonate 1300 mg twice daily.    LOS: 1 Holly Mcgee 3/21/20253:21 PM

## 2023-08-22 LAB — HEPATITIS B SURFACE ANTIBODY, QUANTITATIVE: Hep B S AB Quant (Post): <3.5 m[IU]/mL — ABNORMAL LOW

## 2023-08-22 LAB — HEPATITIS B CORE ANTIBODY, TOTAL: HEP B CORE AB: NEGATIVE

## 2023-09-07 NOTE — Progress Notes (Signed)
 [facility] Follow Up Visit   Patient Name: Holly Mcgee, female   Patient DOB: July 18, 1945 Date of Service: 09/07/2023  Patient MRN: [id number] Provider Creating Note: Woodward Brought, MD  [phone] Primary Care Physician: No primary care provider on file.   [address] [place] [address] Additional Physicians/ Providers:   Impression/Recommendations   Ms. Holly Mcgee is a 78 y.o. female with hypothyroidism, sickle cell disease, glaucoma, and hypertension who presents for follow up. Chronic Kidney Disease stage V. Creatinine 10.38, GFR of 4.    Chronic Kidney Disease stage V: with proteinuria, chronic metabolic acidosis, and hyperkalemia. With uremic symptoms.  - schedule kidney education class. - home dialysis education with [facility].  - patient will need dialysis soon. She is amendable to learning more.  - GFR has been stable for months. Currently no urgent need to initiate dialysis. Patient with some uremic symptoms. Patient wants a second opinion, will refer to [facility].    Hypertension with chronic kidney disease: with peripheral edema. 148/76 - Continue furosemide  - salt and fluid restriction   Anemia of chronic kidney disease: hemoglobin 8.4. with neuropenia - Discussed ESA. Discussed IV iron   Secondary Hyperparathyroidism: PTH elevated at 979.  with hyperphosphatemia and hypocalcemia.  - not currently on a phos binder. Patient wants to try diet first. Literature for low phosphorus diet provided.  - not currently on a vitamin D  agent.   Patient Active Problem List  Diagnosis  . 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 Chronic kidney disease, Stage V (HCC)  . Edema  . Anemia in chronic kidney disease  . Metabolic acidosis  . Hyperkalemia  . Secondary hyperparathyroidism of renal origin [facility])    Orders Placed This Encounter  . Renal Function Panel  .  Urinalysis, Complete w/reflex to Culture  . Protein, Total, Random Urine w/Creatinine (Protein/Creat Ratio)  . Ambulatory referral to Nephrology       Return in about 4 weeks (around 10/05/2023).  Chief Complaint   Chief Complaint  Patient presents with  . Follow-up    History of Present Illness   Ms. Holly Mcgee presents for follow up. Patient presents with her son who assists with history taking.   Patient states she is having lower extremity swelling. She is also having dysgeusia with decreased taste. She claims to be taking furosemide  twice a day.   Patient states she understands that she has kidney failure but wants to get a second opinion. She also wants to know what diet can reverse her kidney damage.   Denies use of nonsteroidal anti-inflammatory agents.   Medications   Current Outpatient Medications:  .  furosemide  (LASIX ) 80 MG tablet, Take 80 mg by mouth in the morning and 80 mg in the evening., Disp: , Rfl:    Allergies Citrus and Pineapple  History Past Medical History:  Diagnosis Date  . Anemia in chronic kidney disease 08/31/2023  . Chronic kidney disease, Stage V (HCC)   . Edema 08/31/2023  . Glaucoma   . Hyperkalemia 08/31/2023  . Hyperlipidemia   . 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 Hypothyroidism   . Metabolic acidosis 08/31/2023  . Secondary hyperparathyroidism of renal origin (HCC) 09/07/2023  . Sickle cell trait [facility])     Past Surgical History:  Procedure Laterality Date  . COLONOSCOPY W/ BIOPSIES    .  HYSTERECTOMY    . NEPHRECTOMY Left    partial   Family History  Problem Relation Age of Onset  . Dementia Mother   . Hypertension Mother   . Diabetes Mother   . Cancer Mother        pancreatic cancer  . Gout Father   . Cancer Father        colon cancer   Social History   Tobacco Use  . Smoking status: Never  . Smokeless tobacco: Never   Substance Use Topics  . Alcohol use: No     Physical Exam  Vitals BP 148/76 (BP Location: Right upper arm, Patient Position: Sitting)   Pulse 86   Temp 97.6 F   Wt 145 lb 9.6 oz (66 kg)   SpO2 90%   BMI 26.63 kg/m  Physical Exam   Laboratory Studies  Chemistry  Lab Units 08/31/23 0955 04/01/23 0952 12/05/22 1236 12/03/22 1156  SODIUM mmol/L 141 140 139 140  POTASSIUM mmol/L 4.3 5.3* 6.3* 6.4*  CHLORIDE mmol/L 101 109 110* 111*  CO2 mmol/L 23 19.6* 17.2* 19.4*  CALCIUM mg/dL 7.6* 6.9* 7.6* 8*  PHOSPHORUS mg/dL 7.7*  --   --   --   ALK PHOS U/L  --  100 92 95  PTH pg/mL 979*  --   --   --   GLUCOSE mg/dL 74 77 77 73  ALBUMIN g/dL 3.7 3.9 3.8 4.1  BUN mg/dL 897* 889* 98* 899*  CREATININE mg/dL 89.61* 9.4* 8.9* 8.8*        No lab exists for component: IRON SATURATION, TRANSSATPER  CBC  Lab Units 08/31/23 0955  WBC AUTO Thousand/uL 2.4*  HEMOGLOBIN g/dL 8.4*  HEMATOCRIT % 72.6*  MCV fL 94.8  PLATELETS AUTO Thousand/uL 156        No lab exists for component: GLUCOSEUR, BILIRUBINUR, SPECGRAV, RBCUR, LEUKOCYTESUR, NITRITE      No lab exists for component: CYCLOSPORITR     Woodward Brought, MD  [employer], [place]

## 2023-09-21 ENCOUNTER — Encounter (HOSPITAL_BASED_OUTPATIENT_CLINIC_OR_DEPARTMENT_OTHER): Payer: Self-pay | Admitting: Emergency Medicine

## 2023-09-21 ENCOUNTER — Other Ambulatory Visit: Payer: Self-pay

## 2023-09-21 ENCOUNTER — Emergency Department (HOSPITAL_BASED_OUTPATIENT_CLINIC_OR_DEPARTMENT_OTHER): Admission: EM | Admit: 2023-09-21 | Discharge: 2023-09-21 | Disposition: A | Discharge status: Home / Self Care

## 2023-09-21 DIAGNOSIS — S90822A Blister (nonthermal), left foot, initial encounter: Secondary | ICD-10-CM | POA: Insufficient documentation

## 2023-09-21 DIAGNOSIS — X58XXXA Exposure to other specified factors, initial encounter: Secondary | ICD-10-CM | POA: Insufficient documentation

## 2023-09-21 NOTE — Discharge Instructions (Signed)
 You should be called by podiatry and nephrology to schedule appointments.  As discussed please keep the foot elevated, try to limit friction and wear open top shoes such as sandals.  Return immediately felt fevers, chills, pain, redness, severe pain, further blisters erupt or you develop any new or worsening symptoms that are concerning to you.  You may try over-the-counter antihistamine such as Benadryl for itching as needed

## 2023-09-21 NOTE — ED Provider Notes (Signed)
  [place] EMERGENCY DEPARTMENT AT [facility] Provider Note   CSN: [id number] Arrival date & time: 09/21/23  1134     History  Chief Complaint  Patient presents with   Leg Swelling    Holly Mcgee is a 78 y.o. female.  78 year old female presents emergency department with a blister to the top of her left foot x 2 days.  No trauma that they can recall, patient states that it felt itchy a couple days ago and she scratched it.  No fevers no chills.  It is not painful.  Her legs are swollen, at baseline per family.  No other lesions.        Home Medications Prior to Admission medications   Medication Sig Start Date End Date Taking? Authorizing Provider  furosemide  (LASIX ) 80 MG tablet Take 1 tablet (80 mg total) by mouth 2 (two) times daily. 08/21/23 09/20/23  Tiajuana Fluke, MD      Allergies    Citrus and Pineapple    Review of Systems   Review of Systems  Physical Exam Updated Vital Signs BP (!) 149/83 (BP Location: Left Arm)   Pulse 84   Temp 98.1 F (36.7 C) (Oral)   Resp 18   SpO2 99%  Physical Exam Vitals and nursing note reviewed.  HENT:     Head: Normocephalic and atraumatic.     Nose: Nose normal.     Mouth/Throat:     Mouth: Mucous membranes are moist.  Eyes:     Conjunctiva/sclera: Conjunctivae normal.  Cardiovascular:     Rate and Rhythm: Normal rate and regular rhythm.  Pulmonary:     Effort: Pulmonary effort is normal.  Abdominal:     General: Abdomen is flat.  Musculoskeletal:     Comments: Bilateral lower extremity edema.  Left foot has a half dollar sized tense blister.  2+ DP pulses.  Neurovascular intact, brisk cap refill.  No erythema or crepitus.  Neurological:     Mental Status: She is alert.  Psychiatric:        Mood and Affect: Mood normal.        Behavior: Behavior normal.     ED Results / Procedures / Treatments   Labs (all labs ordered are listed, but only abnormal results are displayed) Labs Reviewed -  No data to display  EKG None  Radiology No results found.  Procedures Procedures    Medications Ordered in ED Medications - No data to display  ED Course/ Medical Decision Making/ A&P                                 Medical Decision Making Well-appearing 78 year old female presenting emergency department with a blister to her left foot.  She is afebrile nontachycardic hemodynamically stable.  On exam she does have a tense blister, no other lesions.  Does not appear to be infected; no edema no crepitus..  She has soft compartments on exam.  Neurovascular intact.  It is not painful.  Do not feel it needs further workup here.  Discussed outpatient follow-up.  Agreeable plan.  Stable for discharge.          Final Clinical Impression(s) / ED Diagnoses Final diagnoses:  None    Rx / DC Orders ED Discharge Orders     None         Rolinda Climes, DO 09/21/23 1351

## 2023-09-21 NOTE — ED Triage Notes (Signed)
 Large swollen area to left foot x 2 days. Family states patient has stage 4 kidney failure but is not on dialysis,

## 2023-09-27 ENCOUNTER — Other Ambulatory Visit: Payer: Self-pay

## 2023-09-27 ENCOUNTER — Encounter: Payer: Self-pay | Admitting: Emergency Medicine

## 2023-09-27 ENCOUNTER — Emergency Department
Admission: EM | Admit: 2023-09-27 | Discharge: 2023-09-27 | Disposition: A | Discharge status: Home / Self Care | Attending: Emergency Medicine | Admitting: Emergency Medicine

## 2023-09-27 DIAGNOSIS — T148XXA Other injury of unspecified body region, initial encounter: Secondary | ICD-10-CM

## 2023-09-27 DIAGNOSIS — X58XXXA Exposure to other specified factors, initial encounter: Secondary | ICD-10-CM | POA: Insufficient documentation

## 2023-09-27 DIAGNOSIS — S90822A Blister (nonthermal), left foot, initial encounter: Secondary | ICD-10-CM | POA: Insufficient documentation

## 2023-09-27 DIAGNOSIS — N189 Chronic kidney disease, unspecified: Secondary | ICD-10-CM | POA: Insufficient documentation

## 2023-09-27 MED ORDER — DOXYCYCLINE HYCLATE 100 MG PO TABS: 100.0000 mg | ORAL_TABLET | Freq: Two times a day (BID) | ORAL | 0 refills | Status: AC

## 2023-09-27 MED ORDER — DOXYCYCLINE HYCLATE 100 MG PO TABS
100.0000 mg | ORAL_TABLET | Freq: Once | ORAL | Status: AC
  Administered 2023-09-27: 100 mg via ORAL
  Filled 2023-09-27: qty 1

## 2023-09-27 NOTE — ED Triage Notes (Signed)
 Patient to ED via POV for left foot injury. PT states she had a blister on top on foot- had x1 week. Blistered started draining 2 days ago. Now having pain to top of foot. Hx stage 4 kidney disease.

## 2023-09-27 NOTE — Discharge Instructions (Addendum)
 Please go to your podiatry appointment on Tuesday for further management of the blister on your foot that had ruptured.  You can also follow-up with your primary care doctor but since you are going to podiatry already they should be able to assist with further management.

## 2023-09-27 NOTE — ED Provider Notes (Signed)
 [facility] Provider Note    Event Date/Time   First MD Initiated Contact with Patient 09/27/23 1113     (approximate)   History   Foot Injury   HPI  Holly Mcgee is a 78 y.o. female with history of CKD, lymphedema, presenting with a ruptured bulla to the top of her left foot.  Patient states that started 1 week ago, noticed a blister that was yellow, states that it ruptured a couple days ago, had some cloudy yellow drainage to look like pus.  No overlying redness, had some stinging to the area but not significant pain, no fever, no weakness or numbness.  She denies any trauma or falls.  Is scheduled to see a podiatrist on Tuesday.  She denies any other symptoms, no chest pain, shortness of breath, abdominal pain, nausea, vomiting, diarrhea, back pain, unilateral calf swelling or tenderness.  On independent review, she was seen in the ED on 21 April for the blister on the top of her foot, is not painful at that time, it was half dollar size at the time.      Physical Exam   Triage Vital Signs: ED Triage Vitals [09/27/23 1109]  Encounter Vitals Group     BP (!) 162/85     Systolic BP Percentile      Diastolic BP Percentile      Pulse Rate 79     Resp 17     Temp 97.9 F (36.6 C)     Temp Source Oral     SpO2 99 %     Weight 143 lb (64.9 kg)     Height 5\' 3"  (1.6 m)     Head Circumference      Peak Flow      Pain Score 2     Pain Loc      Pain Education      Exclude from Growth Chart     Most recent vital signs: Vitals:   09/27/23 1109  BP: (!) 162/85  Pulse: 79  Resp: 17  Temp: 97.9 F (36.6 C)  SpO2: 99%     General: Awake, no distress.  CV:  Good peripheral perfusion.  Resp:  Normal effort.  Abd:  No distention.  Other:  Palpable DP pulses on the left, she has a ruptured 5 cm bulla to the top of her left foot, there is no tenderness on palpation, full range of motion of her left lower extremity is intact.  There is no  overlying erythema, fluctuance, induration.  She does have clear yellow drainage from the bulla.  No focal weakness or numbness.  No pain out of proportion.  She does have bilateral lower extremity edema that appears symmetrical.   ED Results / Procedures / Treatments   Labs (all labs ordered are listed, but only abnormal results are displayed) Labs Reviewed - No data to display    PROCEDURES:  Critical Care performed: No  Procedures   MEDICATIONS ORDERED IN ED: Medications  doxycycline (VIBRA-TABS) tablet 100 mg (has no administration in time range)     IMPRESSION / MDM / ASSESSMENT AND PLAN / ED COURSE  I reviewed the triage vital signs and the nursing notes.                              Differential diagnosis includes, but is not limited to, ruptured bulla, suspect this is from lymphedema or pressure  irritation, there is no underlying cellulitis but given that she reported cloudy yellow drainage earlier, we will give her a course of antibiotics and have her follow-up with podiatry.  Discussed extensively with family and patient about wound care as well as following up with podiatry for further management.  They are agreeable to plan care.  Considered but no indication for inpatient admission at this time, she is safe for outpatient management.  Will discharge after initial dose of p.o. antibiotics here.  Will send prescription to her pharmacy.  Strict return precautions given.  Discharge.  [name] presentation is most consistent with acute presentation with potential threat to life or bodily function.        FINAL CLINICAL IMPRESSION(S) / ED DIAGNOSES   Final diagnoses:  Blister     Rx / DC Orders   ED Discharge Orders          Ordered    doxycycline (VIBRA-TABS) 100 MG tablet  2 times daily        09/27/23 1136             Note:  This document was prepared using Dragon voice recognition software and may include unintentional dictation errors.     Shane Darling, MD 09/27/23 [phone]

## 2023-09-29 ENCOUNTER — Ambulatory Visit (INDEPENDENT_AMBULATORY_CARE_PROVIDER_SITE_OTHER): Admitting: Podiatry

## 2023-09-29 DIAGNOSIS — Z91199 Patient's noncompliance with other medical treatment and regimen due to unspecified reason: Secondary | ICD-10-CM

## 2023-09-29 NOTE — Progress Notes (Signed)
 Cancel 24 hours

## 2023-10-05 ENCOUNTER — Ambulatory Visit (INDEPENDENT_AMBULATORY_CARE_PROVIDER_SITE_OTHER): Admitting: Podiatry

## 2023-10-05 ENCOUNTER — Encounter: Payer: Self-pay | Admitting: Podiatry

## 2023-10-05 VITALS — Ht 63.0 in | Wt 143.0 lb

## 2023-10-05 DIAGNOSIS — S90822A Blister (nonthermal), left foot, initial encounter: Secondary | ICD-10-CM | POA: Diagnosis not present

## 2023-10-05 NOTE — Progress Notes (Signed)
  Subjective:  Patient ID: Holly Mcgee, female    DOB: Dec 16, 1945,  MRN: [id number]  Chief Complaint  Patient presents with   Blister    Pt is here due to blister on left foot, has been there for 2 weeks states it has bust since making a appointment, was seen in ED for this issue, Pt is has stage 4 cancer and fluid is building up in body thinks this may have something to do with it.    Discussed the use of AI scribe software for clinical note transcription with the patient, who gave verbal consent to proceed.  History of Present Illness Holly Mcgee is a 78 year old female who presents with a healing blister on her left foot.  She developed a blister on her left foot a couple of weeks ago and sought treatment in the emergency room. She was prescribed antibiotics and has three pills remaining. She notes improvement, with less pus and reduced discomfort compared to the initial presentation.  She feels uncomfortable if she does not take the antibiotics. She has been keeping the blister covered most of the time and has not been airing it out. She does not have any loose footwear at home but plans to get a slip-on bedroom shoe to avoid irritation from tight shoes.  No history of diabetes.      Objective:    Physical Exam VASCULAR: DP and PT pulse palpable. Foot is warm and well-perfused. Capillary fill time is brisk. DERMATOLOGIC: Large healing serous blister on left dorsal foot with partial thickness epidermal skin breakdown. No deep ulceration, purulence, malodor, erythema, or active drainage. Normal skin turgor, texture, and temperature. NEUROLOGIC: Normal sensation to light touch and pressure. No paresthesias on examination. ORTHOPEDIC: Smooth pain-free range of motion of all examined joints. No ecchymosis or bruising. No gross deformity. No pain to palpation.   No images are attached to the encounter.    Results     Assessment:   1. Blister of left foot, initial  encounter      Plan:  Patient was evaluated and treated and all questions answered.  Assessment and Plan Assessment & Plan Healing blister on left dorsal foot The blister on the left dorsal foot is healing well with partial thickness epidermal skin breakdown. No deep ulceration, purulence, malodor, erythema, or active drainage is present. The foot is warm, well-perfused, and has normal sensation. The blister is expected to heal completely in three to four weeks. Her non-diabetic status is favorable for healing. No procedure or surgery is needed as the sore is not deep. - Complete the current course of antibiotics. - Advise airing out the blister to promote drying and healing. - Apply iodine or Betadine to the blister and let it air out overnight. - Avoid tight shoes or anything that rubs on the blister area. - Recommend a loose-fitting slip-on bedroom shoe. - Instruct to monitor for signs of infection such as increased redness, heat, or pus and to call if these occur.      Return if symptoms worsen or fail to improve.

## 2023-10-05 NOTE — Patient Instructions (Signed)
                         Contains text generated by Abridge.                                 Contains text generated by Abridge.

## 2023-10-14 NOTE — Progress Notes (Signed)
 [facility] Follow Up Visit   Patient Name: Holly Mcgee, female   Patient DOB: [birth year] Date of Service: 10/14/2023  Patient MRN: [id number] Provider Creating Note: Woodward Brought, MD  [phone] Primary Care Physician: No primary care provider on file.   [address] [place] [address] Additional Physicians/ Providers:   Impression/Recommendations   Holly Mcgee is a 78 y.o. female with hypothyroidism, sickle cell disease, glaucoma, and hypertension who presents for follow up. Chronic Kidney Disease stage V. Creatinine 9.42, GFR of 4.    Chronic Kidney Disease stage V: with proteinuria, chronic metabolic acidosis, and hyperkalemia. With uremic symptoms.  - home dialysis education with [facility].  - patient will need dialysis soon. She is amendable to learning more. Family and patient made aware that if she does not do dialysis, she will need palliative care.  - Restart furosemide  due to volume overload and hypertension - Unclear if patient has dementia or this is uremic encephalopathy.    Hypertension with chronic kidney disease: with peripheral edema. Elevated at 160/79.  - Restart furosemide  - salt and fluid restriction   Anemia of chronic kidney disease: hemoglobin 8.4. with neuropenia - Discussed ESA. Discussed IV iron   Secondary Hyperparathyroidism: PTH elevated at 979.  with hyperphosphatemia and hypocalcemia.  - not currently on a phos binder.  - not currently on a vitamin D  agent.   Patient Active Problem List  Diagnosis  . 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 Chronic kidney disease, Stage V (HCC)  . Edema  . Anemia in chronic kidney disease  . Metabolic acidosis  . Hyperkalemia  . Secondary hyperparathyroidism of renal origin [facility])    Orders Placed This Encounter  . Hepatitis B Core Antibody, Total  . Hepatitis B Surface Antigen  . Hepatitis C  antibody  . Renal Function Panel  . PTH, Intact  . CBC and Differential  . furosemide  (LASIX ) 80 MG tablet       No follow-ups on file.  Chief Complaint   Chief Complaint  Patient presents with  . Follow-up    History of Present Illness   Holly Mcgee presents for follow up. Patient presents with her son, Selinda, who assists with history taking.   Patient has a left anterior foot lesion. She has completed antibiotics. She is follow with podiatry.   Patient was contacted by [facility] for home dialysis education but patient hung up the phone on them. Patient confirms this during the encounter.   Patient missed her appointment for kidney education.   Patient states she is having lower extremity swelling. She is also having dysgeusia with decreased taste.   Patient is currently not taking any medications as all.    Denies use of nonsteroidal anti-inflammatory agents.   Medications   Current Outpatient Medications:  .  furosemide  (LASIX ) 80 MG tablet, Take 1 tablet (80 mg total) by mouth in the morning and 1 tablet (80 mg total) in the evening., Disp: 60 tablet, Rfl: 11   Allergies Citrus and Pineapple  History Past Medical History:  Diagnosis Date  . Anemia in chronic kidney disease 08/31/2023  . Chronic kidney disease, Stage V (HCC)   . Edema 08/31/2023  . Glaucoma   . Hyperkalemia 08/31/2023  . Hyperlipidemia   . 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 Hypothyroidism   .  Metabolic acidosis 08/31/2023  . Secondary hyperparathyroidism of renal origin (HCC) 09/07/2023  . Sickle cell trait [facility])     Past Surgical History:  Procedure Laterality Date  . COLONOSCOPY W/ BIOPSIES    . HYSTERECTOMY    . NEPHRECTOMY Left    partial   Family History  Problem Relation Age of Onset  . Dementia Mother   . Hypertension Mother   . Diabetes Mother   . Cancer Mother        pancreatic  cancer  . Gout Father   . Cancer Father        colon cancer   Social History   Tobacco Use  . Smoking status: Never  . Smokeless tobacco: Never  Substance Use Topics  . Alcohol use: No     Physical Exam  Vitals BP 160/79 (BP Location: Right upper arm, Patient Position: Sitting)   Pulse 60   Temp 97.6 F   Wt 145 lb 9.6 oz (66 kg)   SpO2 98%   BMI 26.63 kg/m   Vitals reviewed. Constitutional: She is oriented to person, place, and time. Vital signs are normal. She appears well-developed.  HEENT:  Head: Normocephalic and atraumatic. Mouth/Throat: Oropharynx is clear and moist.  Eyes: Pupils are equal, round, and reactive to light.  Neck: Neck supple.  Cardiovascular:  Normal rate and regular rhythm.          She exhibits edema.  Pulmonary/Chest: Effort normal and breath sounds normal.  Abdominal: Soft.  Neurological: She is alert and oriented to person, place, and time.  Skin: Skin is warm and dry. Lesion noted.  Left anterior foot lesion 3cm     Laboratory Studies  Chemistry  Lab Units 09/07/23 1232 08/31/23 0955 04/01/23 0952 12/05/22 1236 12/03/22 1156  SODIUM mmol/L 144 141 140 139 140  POTASSIUM mmol/L 4.7 4.3 5.3* 6.3* 6.4*  CHLORIDE mmol/L 103 101 109 110* 111*  CO2 mmol/L 23 23 19.6* 17.2* 19.4*  CALCIUM mg/dL 7.5* 7.6* 6.9* 7.6* 8*  PHOSPHORUS mg/dL 7.9* 7.7*  --   --   --   ALK PHOS U/L  --   --  100 92 95  PTH pg/mL  --  979*  --   --   --   GLUCOSE mg/dL 70 74 77 77 73  ALBUMIN g/dL 3.7 3.7 3.9 3.8 4.1  BUN mg/dL 887* 897* 889* 98* 899*  CREATININE mg/dL 0.57* 89.61* 9.4* 8.9* 8.8*        No lab exists for component: IRON SATURATION, TRANSSATPER  CBC  Lab Units 08/31/23 0955  WBC AUTO Thousand/uL 2.4*  HEMOGLOBIN g/dL 8.4*  HEMATOCRIT % 72.6*  MCV fL 94.8  PLATELETS AUTO Thousand/uL 156        No lab exists for component: GLUCOSEUR, BILIRUBINUR, SPECGRAV, RBCUR, LEUKOCYTESUR, NITRITE      No lab exists for  component: CYCLOSPORITR     Woodward Brought, MD  [employer], [place]

## 2024-03-07 NOTE — Discharge Summary (Signed)
 [facility] Medicine Discharge Summary  Admit Date: 03/04/2024 Discharge Date: 03/07/2024  Admitting Physician: Ripley Marcelyn Lane Vivien, MD Discharge Physician: Fleeta Ruts, MD  Primary Care Provider: Epifanio Alm Dover, MD, Phone [phone]  Discharge Destination: Home with home hospice  Admission Diagnoses:  Hyperkalemia [E87.5] Generalized edema [R60.1] Uremia [N19] CKD (chronic kidney disease) stage 5, GFR less than 15 ml/min (CMS/HHS-HCC) [N18.5] Lewy body dementia, unspecified dementia severity, unspecified whether behavioral, psychotic, or mood disturbance or anxiety (CMS-HCC) [G31.83, F02.80] Renal failure [N19]  Discharge Diagnoses:  Principal Problem:   Hyperkalemia Active Problems:   Sickle cell trait ()   Transfusion of blood product declined due to religious reason   PKD (polycystic kidney disease)   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 *  Primary Diagnosis: Admitted for evaluation of lower extremity swelling and hyperkalemia. Goals of care conversations --> patient and family opted for NO hemodialysis and return home with home hospice support.   Changes Made (with rationale):  Added calcium acetate Added sodium bicarb Added prn lasix  Added prn oxycodone  Note that patient does not like to take medications and may not wish to take any of these medications   To-Do List (incidental findings, follow-up studies, etc.): none  Anticipatory Guidance for Outpatient Care:  Ongoing care via home hospice [facility])     Results Pending at Discharge:  None Please see phone numbers at end of this  summary for lab contact information.   Follow-up/Care Transition Plan: Sched. appts: Future Appointments  Date Time Provider Department Center  03/11/2024  9:30 AM Epifanio Alm Dover, MD [facility]  02/15/2025  9:00 AM Maree Jannett Hering, MD [facility]    Follow-up info: [facility] [address] [place]  [address] [phone]    Epifanio Alm Dover, MD [address] [place] [address] [phone]         Allergies/Intolerances:  Allergies  Allergen Reactions  . Citrus And Derivatives Itching and Swelling     New Adverse Drug Events: none  Medications:     Current Discharge Medication List     START taking these medications      Instructions  calcium acetate(phosphat bind) 667 mg capsule Quantity: 180 capsule Refills: 0  Commonly known as: PHOSLO Take 2 capsules (1,334 mg total) by mouth 3 (three) times daily with meals Last time this was given: 1,334 mg on March 07, 2024  9:54 AM   FUROsemide  40 MG tablet Quantity: 30 tablet Refills: 0  Commonly known as: LASIX  Take 1 tablet (40 mg total) by mouth once daily as needed for Edema (or shortness of breath) Last time this was given: Ask your nurse or doctor   oxyCODONE  5 MG immediate release tablet Quantity: 10 tablet Refills: 0 Stop taking on: March 12, 2024  Commonly known as: ROXICODONE  Take 0.5 tablets (2.5 mg total) by mouth every 6 (six) hours as needed for Pain (or shortness of breath) for up to 5 days   sodium bicarbonate  650 MG tablet Quantity: 120 tablet Refills: 0  Take 2 tablets (1,300 mg total) by mouth 2 (two) times daily Last time this was  given: 1,300 mg on March 07, 2024  9:54 AM         Brief History of Present Illness:  From admission note Holly Mcgee is a 78 y.o. female with history of  HTN, sickle cell trait, 2ndary hyperparathyroidism, polycystic kidneys c/b CKD stage V  resulting in anemia of renal disease c/b refusal of blood transfusion (Jehovah's Witness) and secondary hyperparathyroidism, Lewy body dementia, and hypothyroidism who presents to the ED for generalized weakness.   History obtained in combination with patient interview, family collateral Holly Mcgee, son, and Holly Claude Plessis) - HCPOAs), and chart review given history by patient limited by dementia.   Patient has been seen by Nephrology for CKD for many years, who has been recommending dialysis. However, patient has been declining and thus managed medically only. Patient was brought in by daughter who noticed worsening fatigue as well as reported loss of taste. She was recently seen by Dr. Tish in July 2025 with her son Holly Mcgee, [place]) where she expressed that she is not interested in dialysis.   On presentation to [facility] ED, vitals were T 36.8 C (98.2 F), HR 74, BP (!) 146/82, RR 16, and satting 99 % on RA. Labs notable for Na 139, K 5.6*, HCO3 15*, BUN 130*, sCr 12.6*. CBC notable for WBC 2.0*, Hgb 8.0*, plts 175. Received Lasix  100mg  IV and Ca gluconate in the ER. Seen by nephrology, who  and then admitted to medicine for further evaluation.   On my interview, the patient reported that she feels fine and is not sure why she is here in the hospital. She does not think there is anything wrong with her kidneys because she feels well. She only shares that she occasionally feels she has to take deeper breaths to catch her breath, but not currently. Also confirms the recent change in her ability to taste, but this has improved in recent days/weeks. No fevers, chills, sore throat, cough, chest pain, abdominal pain, n/v/d, dysuria, hematuria, rashes.  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 Developed evidence of mounting volume overload (pleural effusions on CXR, BLE swelling, distended neck veins), electrolyte derangements, and uremia. Evaluated by palliative care and nephrology. Started on sodium bicarb 1300mg  BID, Phoslo 1334mg  TIDCC as GOC conversations ensued with patient and her family. Ultimately opted to return home without starting HD (as has been her consistent wishes) with home hospice support. Provided prescriptions for prn lasix  and low dose oxycodone  for air hunger/swelling.   Sickle cell trait () Transfusion of blood product declined due to religious reason Anemia of renal disease Jehovah's witness, declines blood transfusions. Additional labwork discontinued. 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 Family and patient met with palliative care and in agreement with avoiding HD as has been Holly Mcgee desire for years.   Primary open angle glaucoma of both eyes, severe stage No additional w/u or treatment planned  Hypocalcemia Provided several doses of IV calcium gluconate        Surgeries and Procedures Performed:  None   _____________________  Discharge Exam:  BP (!) 146/78 (BP Location: Right upper arm, Patient Position: Sitting)   Pulse 71  Temp 36.4 C (97.5 F) (Oral)   Resp 20   Ht 160 cm (5' 3)   Wt 76.2 kg (168 lb)   SpO2 100%   BMI 29.76 kg/m     General: alert, cooperative, in NAD, sitting up in chair  Eyes: conjunctiva clear, anicteric sclera HENT: oropharynx clear, moist mucous membranes Cardiovascular: regular rate and rhythm, with 3/6 sys m, no rub appreciated  Dilated EJ, IJ elevated to ear Respiratory: lungs clear today but diminished at bases, breathing comfortably  on ambient air Abdomen: normoactive bowel sounds, soft, nontender, nondistended  Extremities: 2+ pitting edema to thighs Skin: fluid filled bulla R inner thigh, intact and covered with mepilex  Neuro: awake and alert, conversant    Pertinent Lab Testing: Recent Labs  Lab 03/04/24 1945 03/04/24 2314 03/05/24 0319 03/06/24 0642  NA 144   < > 139  138 140  K 5.6*  --  4.5 4.6  CL 110*  --  111* 109*  CO2 15*  --  14* 15*  BUN 130*  --  125* 128*  CREATININE 12.6*  --  12.1* 12.5*  GLUCOSE 82  --  93 67*  CALCIUM 6.7*  --  6.2* 7.1*   < > = values in this interval not displayed.   No results for input(s): AST, ALT, ALKPHOS, TBILI in the last 168 hours.  Recent Labs  Lab 03/04/24 1945 03/05/24 0319  WBC 2.0* 2.1*  HGB 8.0* 7.8*  HCT 24.6* 24.3*  PLT 175 130*   No results for input(s): APTT, INR in the last 168 hours.   Other Pertinent Labs:  iCA 0.84.    Pertinent Imaging:  CXR 03/05/24 FINDINGS/IMPRESSION:   Cardiac and mediastinal contours are partially obscured.   New moderate right and small left pleural effusions with adjacent basilar opacities, most likely atelectasis but superimposed infection/aspiration is not excluded.   No pneumothorax. _____________________  Code Status: DNAR Goals of care were addressed during this admission: Discharging home with home hospice and no plans for initiation of renal replacement therapy.   Status on Discharge:  Current activity: Walks occasionally (03/06/24 2000) Current mobility: No limitation (03/06/24 2000)  Activity Recommendation: activity as tolerated  Other Discharge Instructions: Services setup at discharge: home hospice Tubes/lines at discharge: None  Diet: Diet regular    _____________________  Time spent on discharge process: 45 minutes    NOEL IVEY, MD [facility]  03/07/2024   Hospital Contact Information:  [facility]) [facility]) [facility])  Pending tests:  Laboratory: [phone] Microbiology: [phone] Pathology: [phone] Radiology: [phone]  General questions: [phone] Pending tests: Laboratory: [phone] Microbiology: [phone] Pathology: [phone] Radiology: [phone]  General questions:  [phone] Pending tests:  Laboratory: [phone] Microbiology: [phone] Pathology: [phone] Radiology: [phone]  General questions:  [phone]

## 2024-06-02 DEATH — deceased
